# Patient Record
Sex: Female | Born: 1955 | Race: White | Hispanic: No | Marital: Married | Smoking: Former smoker
Health system: Southern US, Community
[De-identification: ages and names within clinical notes are randomized; demographics above are authoritative.]

## PROBLEM LIST (undated history)

## (undated) DIAGNOSIS — K589 Irritable bowel syndrome without diarrhea: Secondary | ICD-10-CM

## (undated) DIAGNOSIS — K219 Gastro-esophageal reflux disease without esophagitis: Secondary | ICD-10-CM

## (undated) DIAGNOSIS — F319 Bipolar disorder, unspecified: Secondary | ICD-10-CM

## (undated) DIAGNOSIS — M810 Age-related osteoporosis without current pathological fracture: Secondary | ICD-10-CM

## (undated) DIAGNOSIS — E039 Hypothyroidism, unspecified: Secondary | ICD-10-CM

## (undated) DIAGNOSIS — L280 Lichen simplex chronicus: Secondary | ICD-10-CM

## (undated) DIAGNOSIS — K5792 Diverticulitis of intestine, part unspecified, without perforation or abscess without bleeding: Secondary | ICD-10-CM

## (undated) DIAGNOSIS — J449 Chronic obstructive pulmonary disease, unspecified: Secondary | ICD-10-CM

## (undated) DIAGNOSIS — E559 Vitamin D deficiency, unspecified: Secondary | ICD-10-CM

## (undated) DIAGNOSIS — F32A Depression, unspecified: Secondary | ICD-10-CM

## (undated) DIAGNOSIS — I341 Nonrheumatic mitral (valve) prolapse: Secondary | ICD-10-CM

## (undated) DIAGNOSIS — R52 Pain, unspecified: Secondary | ICD-10-CM

## (undated) DIAGNOSIS — Z915 Personal history of self-harm: Secondary | ICD-10-CM

## (undated) HISTORY — PX: SHOULDER ARTHROSCOPY: SHX128

## (undated) HISTORY — PX: REDUCTION MAMMAPLASTY: SUR839

## (undated) HISTORY — PX: BACK SURGERY: SHX140

## (undated) HISTORY — PX: EYE SURGERY: SHX253

## (undated) HISTORY — PX: BREAST SURGERY: SHX581

## (undated) HISTORY — PX: OTHER SURGICAL HISTORY: SHX169

---
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
## 1992-09-18 HISTORY — PX: LEFT OOPHORECTOMY: SHX1961

## 1998-06-02 ENCOUNTER — Other Ambulatory Visit: Admission: RE | Admit: 1998-06-02 | Discharge: 1998-06-02 | Payer: Self-pay | Admitting: Gastroenterology

## 1998-12-29 ENCOUNTER — Ambulatory Visit (HOSPITAL_COMMUNITY): Admission: RE | Admit: 1998-12-29 | Discharge: 1998-12-29 | Payer: Self-pay | Admitting: Gastroenterology

## 1998-12-29 ENCOUNTER — Encounter: Payer: Self-pay | Admitting: Gastroenterology

## 1999-06-02 ENCOUNTER — Encounter: Payer: Self-pay | Admitting: Gastroenterology

## 1999-06-02 ENCOUNTER — Ambulatory Visit (HOSPITAL_COMMUNITY): Admission: RE | Admit: 1999-06-02 | Discharge: 1999-06-02 | Payer: Self-pay | Admitting: Gastroenterology

## 1999-06-13 ENCOUNTER — Encounter: Payer: Self-pay | Admitting: Gastroenterology

## 1999-06-13 ENCOUNTER — Ambulatory Visit (HOSPITAL_COMMUNITY): Admission: RE | Admit: 1999-06-13 | Discharge: 1999-06-13 | Payer: Self-pay | Admitting: Gastroenterology

## 1999-07-11 ENCOUNTER — Other Ambulatory Visit: Admission: RE | Admit: 1999-07-11 | Discharge: 1999-07-11 | Payer: Self-pay | Admitting: Obstetrics and Gynecology

## 1999-11-17 ENCOUNTER — Ambulatory Visit (HOSPITAL_COMMUNITY): Admission: RE | Admit: 1999-11-17 | Discharge: 1999-11-17 | Payer: Self-pay | Admitting: Family Medicine

## 1999-11-17 ENCOUNTER — Encounter: Payer: Self-pay | Admitting: Family Medicine

## 2001-03-22 ENCOUNTER — Ambulatory Visit (HOSPITAL_COMMUNITY): Admission: RE | Admit: 2001-03-22 | Discharge: 2001-03-22 | Payer: Self-pay | Admitting: Family Medicine

## 2001-03-22 ENCOUNTER — Encounter: Payer: Self-pay | Admitting: Family Medicine

## 2001-06-20 ENCOUNTER — Encounter: Admission: RE | Admit: 2001-06-20 | Discharge: 2001-06-20 | Payer: Self-pay | Admitting: Neurosurgery

## 2001-06-20 ENCOUNTER — Encounter: Payer: Self-pay | Admitting: Neurosurgery

## 2001-07-05 ENCOUNTER — Encounter: Payer: Self-pay | Admitting: Neurosurgery

## 2001-07-05 ENCOUNTER — Encounter: Admission: RE | Admit: 2001-07-05 | Discharge: 2001-07-05 | Payer: Self-pay | Admitting: Neurosurgery

## 2001-07-26 ENCOUNTER — Ambulatory Visit (HOSPITAL_COMMUNITY): Admission: RE | Admit: 2001-07-26 | Discharge: 2001-07-26 | Payer: Self-pay | Admitting: Neurosurgery

## 2001-07-26 ENCOUNTER — Encounter: Payer: Self-pay | Admitting: Neurosurgery

## 2001-11-28 ENCOUNTER — Ambulatory Visit (HOSPITAL_BASED_OUTPATIENT_CLINIC_OR_DEPARTMENT_OTHER): Admission: RE | Admit: 2001-11-28 | Discharge: 2001-11-29 | Payer: Self-pay | Admitting: Plastic Surgery

## 2001-11-28 ENCOUNTER — Encounter (INDEPENDENT_AMBULATORY_CARE_PROVIDER_SITE_OTHER): Payer: Self-pay | Admitting: Specialist

## 2002-03-03 ENCOUNTER — Encounter: Admission: RE | Admit: 2002-03-03 | Discharge: 2002-03-03 | Payer: Self-pay | Admitting: *Deleted

## 2002-03-03 ENCOUNTER — Encounter: Payer: Self-pay | Admitting: *Deleted

## 2002-06-27 ENCOUNTER — Encounter: Payer: Self-pay | Admitting: Neurosurgery

## 2002-06-27 ENCOUNTER — Encounter: Admission: RE | Admit: 2002-06-27 | Discharge: 2002-06-27 | Payer: Self-pay | Admitting: Neurosurgery

## 2002-07-25 ENCOUNTER — Encounter: Payer: Self-pay | Admitting: Neurosurgery

## 2002-07-25 ENCOUNTER — Encounter: Admission: RE | Admit: 2002-07-25 | Discharge: 2002-07-25 | Payer: Self-pay | Admitting: Neurosurgery

## 2002-10-17 ENCOUNTER — Emergency Department (HOSPITAL_COMMUNITY): Admission: EM | Admit: 2002-10-17 | Discharge: 2002-10-17 | Payer: Self-pay | Admitting: Emergency Medicine

## 2003-01-13 ENCOUNTER — Encounter: Payer: Self-pay | Admitting: Neurosurgery

## 2003-01-13 ENCOUNTER — Ambulatory Visit (HOSPITAL_COMMUNITY): Admission: RE | Admit: 2003-01-13 | Discharge: 2003-01-13 | Payer: Self-pay | Admitting: Neurosurgery

## 2003-01-22 ENCOUNTER — Encounter: Payer: Self-pay | Admitting: *Deleted

## 2003-01-22 ENCOUNTER — Emergency Department (HOSPITAL_COMMUNITY): Admission: EM | Admit: 2003-01-22 | Discharge: 2003-01-22 | Payer: Self-pay | Admitting: Emergency Medicine

## 2003-02-08 ENCOUNTER — Encounter: Payer: Self-pay | Admitting: Neurosurgery

## 2003-02-08 ENCOUNTER — Ambulatory Visit (HOSPITAL_COMMUNITY): Admission: RE | Admit: 2003-02-08 | Discharge: 2003-02-08 | Payer: Self-pay | Admitting: Neurosurgery

## 2003-03-12 ENCOUNTER — Encounter
Admission: RE | Admit: 2003-03-12 | Discharge: 2003-06-10 | Payer: Self-pay | Admitting: Physical Medicine & Rehabilitation

## 2003-06-12 ENCOUNTER — Encounter
Admission: RE | Admit: 2003-06-12 | Discharge: 2003-09-10 | Payer: Self-pay | Admitting: Physical Medicine & Rehabilitation

## 2003-08-17 ENCOUNTER — Other Ambulatory Visit: Admission: RE | Admit: 2003-08-17 | Discharge: 2003-08-17 | Payer: Self-pay | Admitting: Family Medicine

## 2003-08-26 ENCOUNTER — Encounter: Admission: RE | Admit: 2003-08-26 | Discharge: 2003-08-26 | Payer: Self-pay | Admitting: Gastroenterology

## 2003-08-26 ENCOUNTER — Encounter: Admission: RE | Admit: 2003-08-26 | Discharge: 2003-08-26 | Payer: Self-pay | Admitting: Psychology

## 2003-11-06 ENCOUNTER — Encounter
Admission: RE | Admit: 2003-11-06 | Discharge: 2004-02-04 | Payer: Self-pay | Admitting: Physical Medicine & Rehabilitation

## 2004-01-26 ENCOUNTER — Encounter
Admission: RE | Admit: 2004-01-26 | Discharge: 2004-01-26 | Payer: Self-pay | Admitting: Physical Medicine & Rehabilitation

## 2004-02-01 ENCOUNTER — Encounter
Admission: RE | Admit: 2004-02-01 | Discharge: 2004-05-01 | Payer: Self-pay | Admitting: Physical Medicine & Rehabilitation

## 2004-04-12 ENCOUNTER — Ambulatory Visit (HOSPITAL_COMMUNITY): Admission: RE | Admit: 2004-04-12 | Discharge: 2004-04-13 | Payer: Self-pay | Admitting: Neurosurgery

## 2004-07-02 ENCOUNTER — Emergency Department (HOSPITAL_COMMUNITY): Admission: EM | Admit: 2004-07-02 | Discharge: 2004-07-02 | Payer: Self-pay | Admitting: Family Medicine

## 2004-07-06 ENCOUNTER — Encounter
Admission: RE | Admit: 2004-07-06 | Discharge: 2004-10-04 | Payer: Self-pay | Admitting: Physical Medicine & Rehabilitation

## 2004-07-08 ENCOUNTER — Ambulatory Visit: Payer: Self-pay | Admitting: Physical Medicine & Rehabilitation

## 2004-08-21 ENCOUNTER — Encounter: Admission: RE | Admit: 2004-08-21 | Discharge: 2004-08-21 | Payer: Self-pay | Admitting: Neurosurgery

## 2004-08-29 ENCOUNTER — Ambulatory Visit: Payer: Self-pay | Admitting: Physical Medicine & Rehabilitation

## 2004-11-07 ENCOUNTER — Encounter
Admission: RE | Admit: 2004-11-07 | Discharge: 2005-02-05 | Payer: Self-pay | Admitting: Physical Medicine & Rehabilitation

## 2004-11-07 ENCOUNTER — Ambulatory Visit: Payer: Self-pay | Admitting: Physical Medicine & Rehabilitation

## 2005-01-03 ENCOUNTER — Emergency Department (HOSPITAL_COMMUNITY): Admission: EM | Admit: 2005-01-03 | Discharge: 2005-01-03 | Payer: Self-pay | Admitting: Emergency Medicine

## 2005-01-05 ENCOUNTER — Encounter: Admission: RE | Admit: 2005-01-05 | Discharge: 2005-01-05 | Payer: Self-pay | Admitting: Neurosurgery

## 2005-05-15 ENCOUNTER — Encounter: Admission: RE | Admit: 2005-05-15 | Discharge: 2005-05-15 | Payer: Self-pay | Admitting: Family Medicine

## 2005-05-24 ENCOUNTER — Ambulatory Visit: Payer: Self-pay | Admitting: Physical Medicine & Rehabilitation

## 2005-05-24 ENCOUNTER — Encounter
Admission: RE | Admit: 2005-05-24 | Discharge: 2005-08-22 | Payer: Self-pay | Admitting: Physical Medicine & Rehabilitation

## 2005-09-18 DIAGNOSIS — Z9151 Personal history of suicidal behavior: Secondary | ICD-10-CM

## 2005-09-18 HISTORY — DX: Personal history of suicidal behavior: Z91.51

## 2005-12-15 ENCOUNTER — Ambulatory Visit: Payer: Self-pay | Admitting: Infectious Diseases

## 2005-12-15 ENCOUNTER — Observation Stay (HOSPITAL_COMMUNITY): Admission: EM | Admit: 2005-12-15 | Discharge: 2005-12-16 | Payer: Self-pay | Admitting: Emergency Medicine

## 2006-03-02 ENCOUNTER — Emergency Department (HOSPITAL_COMMUNITY): Admission: EM | Admit: 2006-03-02 | Discharge: 2006-03-02 | Payer: Self-pay | Admitting: Family Medicine

## 2006-04-12 ENCOUNTER — Emergency Department (HOSPITAL_COMMUNITY): Admission: EM | Admit: 2006-04-12 | Discharge: 2006-04-13 | Payer: Self-pay | Admitting: Emergency Medicine

## 2006-06-08 ENCOUNTER — Emergency Department (HOSPITAL_COMMUNITY): Admission: EM | Admit: 2006-06-08 | Discharge: 2006-06-08 | Payer: Self-pay | Admitting: Emergency Medicine

## 2006-06-08 ENCOUNTER — Ambulatory Visit: Payer: Self-pay | Admitting: Family Medicine

## 2006-06-11 ENCOUNTER — Encounter: Admission: RE | Admit: 2006-06-11 | Discharge: 2006-06-11 | Payer: Self-pay | Admitting: Specialist

## 2006-06-15 ENCOUNTER — Encounter: Admission: RE | Admit: 2006-06-15 | Discharge: 2006-06-15 | Payer: Self-pay | Admitting: Family Medicine

## 2006-06-28 ENCOUNTER — Encounter: Admission: RE | Admit: 2006-06-28 | Discharge: 2006-06-28 | Payer: Self-pay | Admitting: Gastroenterology

## 2007-01-23 ENCOUNTER — Encounter: Admission: RE | Admit: 2007-01-23 | Discharge: 2007-01-23 | Payer: Self-pay | Admitting: Gastroenterology

## 2007-07-30 ENCOUNTER — Encounter: Admission: RE | Admit: 2007-07-30 | Discharge: 2007-07-30 | Payer: Self-pay | Admitting: Family Medicine

## 2008-01-14 ENCOUNTER — Ambulatory Visit (HOSPITAL_COMMUNITY): Admission: RE | Admit: 2008-01-14 | Discharge: 2008-01-14 | Payer: Self-pay | Admitting: Chiropractic Medicine

## 2008-05-14 ENCOUNTER — Encounter: Admission: RE | Admit: 2008-05-14 | Discharge: 2008-05-14 | Payer: Self-pay | Admitting: Anesthesiology

## 2009-01-06 ENCOUNTER — Encounter: Admission: RE | Admit: 2009-01-06 | Discharge: 2009-01-06 | Payer: Self-pay | Admitting: Anesthesiology

## 2010-01-14 ENCOUNTER — Encounter: Admission: RE | Admit: 2010-01-14 | Discharge: 2010-01-14 | Payer: Self-pay | Admitting: Gastroenterology

## 2011-01-03 ENCOUNTER — Other Ambulatory Visit (HOSPITAL_COMMUNITY): Payer: Self-pay | Admitting: Gastroenterology

## 2011-01-03 DIAGNOSIS — R112 Nausea with vomiting, unspecified: Secondary | ICD-10-CM

## 2011-01-13 ENCOUNTER — Other Ambulatory Visit (HOSPITAL_COMMUNITY): Payer: Self-pay

## 2011-01-28 ENCOUNTER — Emergency Department (HOSPITAL_COMMUNITY)
Admission: EM | Admit: 2011-01-28 | Discharge: 2011-01-28 | Disposition: A | Discharge status: Home / Self Care | Payer: 59 | Attending: Emergency Medicine | Admitting: Emergency Medicine

## 2011-01-28 ENCOUNTER — Emergency Department (HOSPITAL_COMMUNITY): Payer: 59

## 2011-01-28 DIAGNOSIS — K5732 Diverticulitis of large intestine without perforation or abscess without bleeding: Secondary | ICD-10-CM | POA: Insufficient documentation

## 2011-01-28 DIAGNOSIS — R1032 Left lower quadrant pain: Secondary | ICD-10-CM | POA: Insufficient documentation

## 2011-01-28 DIAGNOSIS — E039 Hypothyroidism, unspecified: Secondary | ICD-10-CM | POA: Insufficient documentation

## 2011-01-28 DIAGNOSIS — F17200 Nicotine dependence, unspecified, uncomplicated: Secondary | ICD-10-CM | POA: Insufficient documentation

## 2011-01-28 DIAGNOSIS — Z90710 Acquired absence of both cervix and uterus: Secondary | ICD-10-CM | POA: Insufficient documentation

## 2011-01-28 LAB — CBC
HCT: 43.5 % (ref 36.0–46.0)
MCH: 31.2 pg (ref 26.0–34.0)
MCHC: 34.5 g/dL (ref 30.0–36.0)
MCV: 90.4 fL (ref 78.0–100.0)
Platelets: 262 10*3/uL (ref 150–400)
RDW: 13.6 % (ref 11.5–15.5)
WBC: 14.9 10*3/uL — ABNORMAL HIGH (ref 4.0–10.5)

## 2011-01-28 LAB — DIFFERENTIAL
Eosinophils Absolute: 0.1 10*3/uL (ref 0.0–0.7)
Eosinophils Relative: 0 % (ref 0–5)
Lymphs Abs: 3.4 10*3/uL (ref 0.7–4.0)
Monocytes Absolute: 1.2 10*3/uL — ABNORMAL HIGH (ref 0.1–1.0)
Monocytes Relative: 8 % (ref 3–12)

## 2011-01-28 LAB — COMPREHENSIVE METABOLIC PANEL
Albumin: 4.1 g/dL (ref 3.5–5.2)
BUN: 10 mg/dL (ref 6–23)
Calcium: 8.7 mg/dL (ref 8.4–10.5)
Creatinine, Ser: 0.82 mg/dL (ref 0.4–1.2)
Glucose, Bld: 95 mg/dL (ref 70–99)
Total Protein: 7.2 g/dL (ref 6.0–8.3)

## 2011-01-28 LAB — URINALYSIS, ROUTINE W REFLEX MICROSCOPIC
Glucose, UA: NEGATIVE mg/dL
Leukocytes, UA: NEGATIVE
Specific Gravity, Urine: 1.021 (ref 1.005–1.030)
Urobilinogen, UA: 0.2 mg/dL (ref 0.0–1.0)

## 2011-01-28 LAB — URINE MICROSCOPIC-ADD ON

## 2011-01-28 LAB — LIPASE, BLOOD: Lipase: 13 U/L (ref 11–59)

## 2011-02-03 NOTE — Op Note (Signed)
NAME:  [name], [name]                         ACCOUNT NO.:  [id number]   MEDICAL RECORD NO.:  [id number]                   PATIENT TYPE:  OIB   LOCATION:  NA                                   FACILITY:  [facility]   PHYSICIAN:  Henry A. Pool, M.D.                 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   PROCEDURE:  Bilateral L4-5 decompressive laminotomies with foraminotomies.   SURGEON:  Kathaleen Maser. Pool, M.D.   ASSISTANT:  Reinaldo Meeker, M.D.   ANESTHESIA:  General endotracheal.   INDICATIONS FOR PROCEDURE:  The patient is a 55 year old female with a  history of bilateral lower extremity pain, consistent with neurogenic  claudication, failing conservative management. Workup demonstrates evidence  of degenerative disk disease at L4-5 and L5-S1, with broad-based disk  bulging and facet hypertrophy, coupled with ligamentous buckling at L4-5,  causing moderately-severe stenosis, particularly in the lateral recess.  The  patient has been counseled as to her options.  She has failed conservative  management.  She has decided to proceed with bilateral L4-5 decompressive  laminotomies.   DESCRIPTION OF PROCEDURE:  The patient is taken to the operating room and  placed on the table in the supine position.  After an adequate level of  anesthesia was achieved, the patient was placed prone onto the Wilson frame  and appropriately padded.  The patient's lumbar region was prepped and  draped sterilely.  A #10 blade was used to make a linear skin incision  overlying the L4-5 interspace.  This was carried down sharply in the  midline.  A subperiosteal dissection was then performed, exposing the lamina  of the facet joints at L4 and L5.  A deep self-retaining retractor was  placed.   Intraoperative x-ray was taken.  The level was confirmed.  A  decompressive laminotomy was then performed bilaterally using a high-speed  drill and the Kerrison rongeurs to remove the inferior one-third of the  lamina of L4 and the medial edge of the L4-5 facet joint, and the superior  rim of L5.  The ligament of flavum was then elevated bilaterally using  Kerrison rongeurs.  The underlying thecal sac and exiting L5 nerve roots  were identified.  A wide foraminotomy was performed along the course of the  exiting L5 nerve roots bilaterally.  Then using microdissection of the  spinal canal, starting first on the patient's right side.  The L5 nerve root  was gradually mobilized and tracked towards the midline.  The epidural  venous plexus was coagulated and cut.  The disk space while diffusely  bulging was not frankly herniated, and a diskectomy did not seem to be  appropriate.  The contralateral side was  explored in a similar fashion and  the findings were also similar.  A blunt probe was placed along the course  of the exiting nerve root.  There was no evidence of injury to the thecal  sac or nerve roots.  The wound was then copiously irrigated with antibiotic  solution.  Gelfoam was placed for operative hemostasis and found to be good.  The microscope and the retractor system were removed.  Hemostasis in the  muscle was achieved  with electrocautery.  The wounds were then closed in layers with Vicryl  sutures.  Steri-Strips and a sterile dressing were applied.  There are no  apparent complications.  The patient tolerated the operative procedure well and she returns to the  recovery room postoperatively.                                               Henry A. Pool, M.D.    HAP/MEDQ  D:  04/12/2004  T:  04/12/2004  Job:  [id number]

## 2011-02-03 NOTE — Assessment & Plan Note (Signed)
[name] returns today after she had a medial branch block at L3-4, L4-5,  and L5-S1 bilaterally.  She had pain relief for two days in terms of her low  back pain.  Since that time, she was doing as well as usual when she had an  exacerbation on Jan 29, 2004, without a history of trauma.  The patient has  no significant leg pain.  She does have some thigh pain on the right side,  mainly posterior and lateral.   No weakness in her legs.  The pain diagram indicates pain in the low back  and posterior left thigh, although she states it is actually the right side  that hurts her.  The right side is 10/10.   REVIEW OF SYSTEMS:  No bowel or bladder problems.   VOCATIONAL HISTORY:  She worked a full day on Friday and went to work this  morning as well.   PHYSICAL EXAMINATION:  Blood pressure 116/72, pulse 98, O2 saturation 98% on  room air.  General:  In no acute distress.  Mood and affect appropriate.  Gait:  Moves slowly and steadily, but otherwise no antalgia.  She has  tenderness to palpation in the right gluteus medius area also in the midline  at L5-S1.   She has 50% range on forward flexion, extension, lateral rotation, and  bending.  Pain with both forward flexion and extension.  Motor strength is  5/5 in bilateral lower extremities.  She has no tenderness to palpation in  bilateral lower extremities.  Full range of motion in bilateral lower  extremities.  Normal deep tendon reflexes in bilateral lower extremities.   IMPRESSION:  1. Lumbar facet syndrome.  2. Myofascial pain syndrome.   PLAN:  1. Do trigger point injection of right gluteus medius, which is tender to     touch today.  2. Referral back to Dr. Stevphen Rochester for lumbar medial branch blocks.   ADDENDUM:  I did review her MR of the lumbar spine, which really showed no  changes compared to the previous study one year ago.  There is facet  arthropathy noted at L3-4 and disk protrusion at L4-5, as well as facet  arthropathy at this level, and a mild to moderate multifactorial canal  stenosis secondary to these two features.  L5-S1 had a broad based disk  bulge.  Some foraminal encroachment.  In addition, she had MR of her C-  spine.  Once again no significant change.  Previously she has had anterior  fusion at C4-5, kyphosis at C3-4, and some right paracentral protrusion at  C3-4.  No change in cord signal.   PLAN:  As noted above.  I have also given her some Celebrex samples 300 mg  today with the first day starting with 300 mg.   ADDENDUM:  Trigger point at right gluteus medius:  Informed consent was  obtained.  The area was marked, prepped with Betadine, and entered with a 25  gauge 1/4 inch needle at three separate sites that were tender to touch  after prep.  Then 1 mL of lidocaine was injected at each site after negative  drawback for blood.  The patient tolerated the procedure well.      Erick Colace, M.D.   AEK/MedQ  D:  02/01/2004 16:58:19  T:  02/01/2004 19:48:39  Job #:  [id number]   cc:   Celene Kras, MD  Fax: [phone]

## 2011-02-03 NOTE — Op Note (Signed)
[place]  Patient:    [name], [name] Visit Number: [id number] MRN: [id number]          Service Type: DSU Location: [facility] Attending Physician:  Eloise Levels Dictated by:   Mary A. Contogiannis, M.D. Proc. Date: 11/28/01 Admit Date:  11/28/2001   CC:         Darcella Cheshire, M.D.   Operative Report  PREOPERATIVE DIAGNOSIS:  Bilateral macromastia.  POSTOPERATIVE DIAGNOSIS:  Bilateral macromastia.  PROCEDURE:  Bilateral reduction mammoplasties.  ATTENDING SURGEON:  Mary A. Contogiannis, M.D.  ASSISTANT:  Darcella Cheshire, M.D.  ANESTHESIA:  General endotracheal.  ANESTHESIOLOGIST:  Judie Petit, M.D.  ESTIMATED BLOOD LOSS:  300 cc.  FLUID REPLACEMENT:  3,000 cc of crystalloid.  URINE OUTPUT: 175 cc.  COMPLICATIONS:  None.  JUSTIFICATION FOR OVERNIGHT STAY:  Progressive pain control, along with ambulation and monitoring of the nipples and breast flaps.  INDICATIONS FOR THE PROCEDURE:  The patient is a 55 year old Caucasian female who has bilateral macromastia that is clinically symptomatic with neck aches, backaches, shoulder strap groove marks, and pain, along with intertriginous skin rashes.  She presents and requests to undergo bilateral reduction mammoplasties.  PROCEDURE:  The patient was marked in the preoperative holding area in the pattern of Wise for the future of bilateral reduction mammoplasties.  She had had a previous mastopexy that had a periareolar and vertical scar component, and after talking to the surgeon, he said that with the previous mastopexy, all he did was remodel her skin and did not go into the parenchyma of the breast so the inferior pedicle pattern should be a safe method for the nipple. Preoperatively, I also discussed with the patient that since one of her complaints with her previous mastopexy was that her nipples were so high that she may again have high-placed nipples because I was  somewhat limited by the scarring and position that was present now with planning the future position. She said that she will be accepting of this, and of course, I marked her, trying to lower the nipple as much as possible.  The patient was then taken back into the OR.  She was placed on the table in the supine position, and general endotracheal anesthesia was obtained. The chest was then prepped with Betadine and alcohol and draped in a sterile fashion.  The base of the breasts were then injected with 1% lidocaine with epinephrine.  After adequate hemostasis and anesthesia had taken effect, the procedure was begun.  First, the right breast reduction was performed.  The nipple was incised with the 42 mm nipple marker.  The skin was then incised and de-epithelialized down to the inframammary crease in an inferior pedicle pattern.  Next, the medial, superior, and lateral skin flaps were then elevated down to the chest wall and slightly off the chest wall for shaping them.  The excess fat and glandular tissue were removed from the inferior pedicle.  The nipple was then examined and found to be pink and viable.  The wound was then irrigated with saline irrigation. Meticulous hemostasis was then obtained with the Bovie electrocautery.  The inferior pedicle was then centralized with 3-0 Prolene suture.  A #10 flat Foley fluted drain was then placed into the wound.  The skin flaps were brought together at the inverted T junction with a 2-0 Prolene suture.  These incisions were stapled for temporary closure.  Attention was then turned to the left breast.  The nipple  likewise was incised with a 42 mm nipple marker. The skin was then incised around this and de-epithelialized down to the inframammary crease in the inferior pedicle pattern.  Next, the medial, superior, and lateral skin flaps were then elevated down to the chest wall and slightly off the chest wall for better shaping of the breast.   The excess fat and glandular tissue were excised from the inferior pedicle. The nipple was then examined and found to be pink and viable.  The wound was irrigated with saline irrigation.  Meticulous hemostasis was obtained with the Bovie electrocautery.  A #10 JP flap fully fluted drain was then placed into the wound.  The skin flaps were brought together in the inverted T junction with a 2-0 Prolene suture.  The incision was then stapled for temporary closure.  The breasts were then compared and found to have good shape and symmetry.  The patient was then placed up into the upright position for marking of the nipple locations.  The nipples were then marked on each breast using a 38 mm nipple marker.  She was then placed back into the recumbent position.  First, the future nipple/areolar complex on the right breast was incised.  This tissue was excised full thickness.  The nipple was then examined and found to be pink and viable and then brought out into this aperture and sewn in place using 4-0 Monocryl interrupted dermal sutures, followed by a 5-0 Monocryl intracuticular stitch on the skin.  In a likewise fashion, the area for the future nipple areola complex was then incised on the left breast.  This tissue was excised full thickness.  The nipple was then examined and found to be pink and viable. It was then brought out into this aperture and sewn in place using 4-0 Monocryl interrupted dermal sutures, followed by 5-0 Monocryl interrupted stitch on the skin. The JP drains were sewn in place using 3-0 nylon suture. The incisions were dressed with Benzoin and Steri-Strips and the nipples additionally with Bacitracin ointment and Adaptic.  4 x 4 gauze was then placed over all the incisions, and the patient was placed into a light postoperative support bra. There were no complications.  The patient tolerated the procedure well. The final needle and sponge counts were reported to be correct  at the end of the case. She was then awakened from general anesthesia  and then taken to the recovery room in stable condition. The patient will stay overnight in the [facility] for progressive pain control, along with ambulation and monitoring of the nipples and breast flaps.  Discharge planned for the morning. Dictated by:   Mary A. Contogiannis, M.D. Attending Physician:  Eloise Levels DD:  11/28/01 TD:  11/29/01 Job: [id number]

## 2011-02-03 NOTE — Procedures (Signed)
NAME:  Lynn, [name]                         ACCOUNT NO.:  [id number]   MEDICAL RECORD NO.:  [id number]                   PATIENT TYPE:  REC   LOCATION:  [facility]                                  FACILITY:  [facility]   PHYSICIAN:  Celene Kras, MD                     DATE OF BIRTH:  01-26-1956   DATE OF PROCEDURE:  02/23/2004  DATE OF DISCHARGE:                                 OPERATIVE REPORT   PATIENT:  [name]   MEDICAL RECORD NUMBER:  [id number]   DATE OF BIRTH:  July 20, 1956   SURGEON:  Jewel Baize. Stevphen Rochester, M.D.   [name] and I evaluated  her via health and history form and 14-point review of systems.   1. I had extensive discussion as to overall directed care approach and     indications and contraindications of radiofrequency neural ablation.  She     has demonstrated a positive provocative block and improvement in function     and quality of life indices.  Her pain is primarily directed leftward.     She does have some right-sided pain, but this is not her most disabling     profile.   1. I am going to go ahead and radiofrequency neural ablate the left facet at     the medial branch.  This will be at L5-S1, L4-5, L3-4, and L2-3 with     contributory innervation addressed.  She understands that the potential     complications include bleeding, infection, nerve damage, stroke, seizure,     death, increase in pain, no relief from pain, and other unforeseen     problems not commonly encountered.  She also understands potential for     neuritis.  She also understands the potential for repeating the     procedure.   1. I recommend cigarette cessation for best outcome.   1. Home based therapy reviewed.   1. She relates paracervical dysfunction and myofascial pain that is starting     to evolve from previous cervical pain that she has experienced after     surgery.  There is nothing new neurologically.  This is a modest pain  that is not particularly pronounced today, but we will keep this in the     differential.   She has consented for today's procedure.   OBJECTIVE:  She had diffuse suprascapular paracervical myofascial discomfort  and pain over PSIS with notable pain extension.  She has no new neurological  findings motor, sensory or reflexive.   IMPRESSION:  1. 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  2. Degenerative spine disease of cervical spine.   PROCEDURE:  The patient was taken to the fluoroscopy suite and placed in the  prone position.  The leg was prepped and draped in the usual fashion.  Using  a 22 gauge mm  active tip RF needle, I advance to the facet medial branch of  L5-S1, L4-5, L3-4, and L2-3.  Confirmed placement of multiple fluoroscopic  positions.  Highest Isovue 200.  I appropriately stimulate.  I then  reconfirm needle placement at multiple points during the procedure.  I  injected 1 mL of local anesthetic at each level with a total of 40 mg of  Aristocort in divided dose.  Lesion is then performed at 70 degrees for 70  seconds at each level.   She tolerated the procedure well.  No complications from our procedure.  Appropriate recovery.  Discharge instructions given.  Will see her in  followup.  No complications identified.   PLAN:  Facet neurotomy of left side at L5-S1, L4-5, L3-4, and L2-3.  Contributory innervation consented.  She is consented left side under local  anesthetic.                                                Celene Kras, MD    HH/MEDQ  D:  02/23/2004 10:45:37  T:  02/23/2004 11:49:06  Job:  [id number]

## 2011-02-03 NOTE — Assessment & Plan Note (Signed)
MEDICAL RECORD NUMBER:  [id number]   DATE OF BIRTH:  12/05/1955   REASON FOR FOLLOW-UP:  The patient comes in today after I last saw her on  Feb 01, 2004.  She has had prior improvements in low back pain after lumbar  medial branch blocks.  She had an MR of the lumbar spine on Jan 26, 2004  which showed facet arthropathy at L3-4, a disk protrusion at L4-5 greater  than L5-S1 with some foraminal encroachment at L4-5 and L5-S1, but per  radiology no significant changes.  MR of her cervical spine did not show any  significant changes.  She has at interval times seen Dr. Stevphen Rochester on February 23, 2004 who did a left sided lumbar medial branches with radiofrequency  denervation.  She has not noted any significant changes in her pain since  the procedure on June 7, but as she was told by Dr. Stevphen Rochester, her pain could  get worse for a month before it gets much better.  In the interval time  since I last saw the patient, she has seen Dr. Dutch Quint and she states that she  is scheduled for surgery on April 12, 2004 for what sounds like decompression  laminectomy without fusion.  However, I do not have notes.   MEDICATIONS:  1. She has gone up on her Norco dosage in frequency taking about three and a     half tablets a day, but now her pain is easing up again and is down to     three tablets a day.  She has been out for two days now.  2. Zanaflex 4 mg q.h.s.  3. Ambien 10 mg q.h.s.  4. Wellbutrin XL which she is getting through her primary care office.  5. Lidoderm patch.  She does feel that the lidocaine patches are effective.   INTERVAL HISTORY:  Her interval history is as above.  Her current pain level  is 9 out of 10, low back pain going into the posterior thighs but not below  the knee and also neck and upper trapezius pain.   SOCIAL HISTORY:  She continues to work full time. She smokes a pack a day.   REVIEW OF SYMPTOMS:  As above.  GENERAL:  She continues with poor sleep.   PHYSICAL EXAMINATION:   VITAL SIGNS:  Blood pressure 127/91, pulse 77,  respirations 20, 02 saturation 97% in room air.  SENSORY EXAM:  Decreased left L4-5 and S1 dermatome sensation to pinprick.  Her motor strength is 5/5 in the bilateral upper and lower extremities.  Her  neck has full flexion and extension. However, lateral rotation and bending  are approximately 25% of normal.  Her low back - she is able to touch her  knees bending forward; bending backward she has about 25% range and lateral  bending and rotation approximately 25%.  She has normal deep tendon reflexes  in the bilateral lower extremities and normal range of motion of bilateral  lower extremities.  No pain to palpation of bilateral lower extremities.  She has normal strength in the bilateral upper and lower extremities and is  able to walk and heel walk.   IMPRESSION:  1. Chronic low back pain extending into the posterior thighs with     improvement after lumbar medial branch blocks, at least temporarily.     Thus far, she has not noted much improvement with left radiofrequency.  2. Lumbar disk disease with L4-5 displacement without evidence of myelopathy  at this point.  The patient asked me whether or not I thought that the     surgery would help with her pain symptoms and I stated it was a fairly     difficult question given that her pain extends to her knees but not     below.  She is a bit apprehensive about having surgery again.  I     encouraged her to follow-up with Dr. Stevphen Rochester in regards to discussing any     other medial branch blocks or radiofrequency neurotomy.  In terms of     medications, I have recommended continuing the hydrocodone 10/325 mg and     have it t.i.d. dosed during work. She actually just takes a half a tablet     during the day.  I have indicated that she may do better with a longer     acting medication should she require long term narcotic analgesics.  Any     postoperative pain management would be per her  neurosurgery and I would     be happy to see her if she has significant residual pain.  She can     continue on her Zanaflex as well.  The ultimate goal would be to able to     manage her without narcotic analgesics and get her just back to something     such as Ultram along with Zanaflex and Lidoderm patch.      Erick Colace, M.D.   AEK/MedQ  D:  03/18/2004 13:08:05  T:  03/18/2004 17:30:20  Job #:  [id number]   cc:   Sherilyn Cooter A. Pool, M.D.  [address]  [place]  [place] [address]  Fax: [phone]

## 2011-02-03 NOTE — Procedures (Signed)
NAME:  [name], [name]                         ACCOUNT NO.:  [id number]   MEDICAL RECORD NO.:  [id number]                   PATIENT TYPE:  REC   LOCATION:  TPC                                  FACILITY:  [facility]   PHYSICIAN:  Celene Kras, MD                     DATE OF BIRTH:  [birth year]   DATE OF PROCEDURE:  DATE OF DISCHARGE:                                 OPERATIVE REPORT   SUBJECTIVE:  [name] to  evaluate her __________ 14 point review of systems.   #1 - [name] comes in today after a lengthy absence from the clinic.  She has  had some difficulties in her life.  Her mother has gotten sick, and she has  had a friend who lost another loved one.  These are certainly very  distressing issues, but certainly from a continuity standpoint, it is going  to be necessary for her to make time to maintain contact with our office to  move forward and improve her quality of life.   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  We would like to encourage her to see her  primary care physician to help with cigarette cessation.  This will be for  her best outcome position.  We discussed home based therapy.  I would like  to get her in a formalized training program.   #3 - Will go ahead and proceed with medial branch injection.  Most  problematic side is the left side.  Risks, complications and options are  fully outlined.  Although she has bilateral pain, I do not think we need to  do bilateral injections.  The left side is most problematic, and I will see  her back in 2 weeks to determine further course of care.   #4 - I am not going to go too far into controlled substances.  I think we  can control her with nonnarcotic medication alternatives, multimodality  approach to pain control and proceed with  medial branch injection with  possible reinforcement radiofrequency neural ablation with positive  provocative block.  I just do not think narcotics are warranted at this  time.   OBJECTIVE:  Diffuse paralumbar myofascial discomfort, pain over PSIS,  notable pain on extension with Gaenslen's and Patrick's equivocal.  She has  no new neurological findings, motor and sensory reflexes.   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   PLAN:  Facet medial branch injection L5-S1, L4-5, L3-4, L2-3 with  contributory innervation addressed.  This will be at the left side.   DESCRIPTION OF PROCEDURE:  The patient was taken to the fluoroscopy suite  and placed in the prone  position.  Neck prepped and draped in the usual  fashion.  Using a 22 gauge spinal needle, I advanced the facet medial branch  at L5-S1, L4-5, L3-4, L2-3.  Contributory innervation addressed, left side.  I confirmed placement of multiple fluoroscopic positions.  I then injected 1  cc of Lidocaine and 1% MPF at each level with a total of 40 mg of Aristocort  in divided dose.   Tolerated this procedure well.  No complications while in procedure.  Appropriate recovery.  Discharge instructions given.  Improved at discharge.                                                Celene Kras, MD    HH/MEDQ  D:  12/01/2003 08:46:53  T:  12/01/2003 10:20:08  Job:  [id number]

## 2011-02-03 NOTE — Procedures (Signed)
NAME:  [name], [name]                         ACCOUNT NO.:  [id number]   MEDICAL RECORD NO.:  [id number]                   PATIENT TYPE:   LOCATION:                                       FACILITY:  [facility]   PHYSICIAN:  Celene Kras, MD                     DATE OF BIRTH:  Jan 17, 1956   DATE OF PROCEDURE:  12/22/2003  DATE OF DISCHARGE:                                 OPERATIVE REPORT   HISTORY:  The patient comes in for pain management today.  I evaluated her  via the health and history form with a 14-point review of systems.  She comes to [place] stating that the left side medial branch injection improved  her function and quality of life by about 25%-50%, with nearly three to four  days of complete diminution of pain perception, and she has not quite  reached recrudescence to baseline.  She does have some right-sided pain, and  it is reasonable to inject both sides today, prior to considerations of  radiofrequency neural ablation.  She has seen her primary care physician about cigarette cessation, which is  the best outcome predictive element.  Her mother has many medical problems interfering with her ability to  participate with after-work home-based therapy.  We are encouraged that she  continues to work, and emphasized this as another outcome of predictive  element.   PHYSICAL EXAMINATION:  Objectively, diffuse paralumbar myofascial  discomfort.  Pain over the PSIS.  Notable pain on extension.  No new  neurological findings in motor, sensory, or reflexes.  She seems to have a  little bit better range of motion.   IMPRESSION:  1. 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  2. Facet syndrome.   PLAN:  Facet medial branch injection at L5-S1 and at L4-L5, L3-L4, right and  left sides at independent needle access points.  Contributory elements  addressed.  She has consented for today's procedure.   DESCRIPTION OF PROCEDURE:  The patient is taken to the fluoroscopy suite and  placed in the prone position.  The back was prepped and draped in the usual  fashion using a #22 gauge spinal needle.  I advanced to the facet medial  branch at L5-S1, L4-L5, L3-L4, with contributory innervation addressed,  right and left sides, at independent needle access points.  Confirmed  placement.  I then injected 1 mL of Lidocaine 1% MPF at each level, with a  total of 40 mg of Aristocort in a divided dose.  She tolerated this procedure well.  No complications from the procedure.  A review of medications, appropriate to prescriptions.  Discharge  instructions given.  Predicate movement to a radiofrequency neural ablation,  based on response.  I don't plan another injection.  I would like to see how  she does.  Celene Kras, MD    HH/MEDQ  D:  12/22/2003 08:43:30  T:  12/22/2003 10:02:20  Job:  [id number]

## 2011-02-03 NOTE — Procedures (Signed)
[name], GALENTINE               ACCOUNT NO.:  [id number]   MEDICAL RECORD NO.:  [id number]          PATIENT TYPE:  REC   LOCATION:  [facility]                          FACILITY:  [facility]   PHYSICIAN:  Erick Colace, M.D.DATE OF BIRTH:  [birth year]   DATE OF PROCEDURE:  DATE OF DISCHARGE:                                 OPERATIVE REPORT   This is bilateral levator scapular trigger point injection.   INDICATIONS FOR PROCEDURE:  Neck pain and stiffness, previously improved  with trigger point injections.   Informed consent was obtained after describing risks and benefits of the  procedure to the patient. Specific risks due to location include  pneumothorax. This was discussed. The patient understands and wishes to  proceed.   The patient has a point with maximum pain palpated, marked and prepped with  alcohol. These spots were approximately 4 cm lateral to the spinous process  of C7 bilaterally and just above the upper medial border of the scapula. A  25-gauge 1-1/2-inch needle was inserted approximately 1/2 depth. Lidocaine  1% was injected, 1 cc into each side. Trigger point deactivation produced no  twitch response. The patient tolerated the procedure well. Post injection  instructions given.      Erick Colace, M.D.  Electronically Signed     AEK/MEDQ  D:  05/25/2005 17:39:24  T:  05/26/2005 08:55:16  Job:  [id number]

## 2011-02-03 NOTE — Assessment & Plan Note (Signed)
DATE OF SERVICE:  November 08, 2004.   MEDICAL RECORD NUMBER:  [id number].   DATE OF BIRTH:  1956/05/01.   Ms. [name] returns today.  I last saw her on August 29, 2005.  She has had  some flare up of her pain.  She has stopped working because of layoff.  She  has pain-wise called in once, talked to Dr. Riley Kill and got hydrocodone one  p.o. b.i.d.  The pain is mainly in her upper trapezius area, as well as the  left side of her low back going into the left thigh.   REVIEW OF SYSTEMS:  Poor sleep.  Anxiety.  Some headaches.  Dizziness.  Reflux and heartburn.   SOCIAL HISTORY:  Smokes a half of a pack a day.  Married.   PHYSICAL EXAMINATION:  Blood pressure 122/68, pulse 76, respirations 21, O2  saturation 100% on room air.  In no acute distress.  Mood and affect  appropriate.  She has tenderness to palpation in bilateral cervical  paraspinals.  She has tenderness in left PSIS.  She has positive Pearlean Brownie on  the left lower extremity.  She has normal strength and reflexes in bilateral  lower extremities with normal range of motion.   IMPRESSION:  1.  Cervical myofascial pain syndrome.  2.  Cervical facet syndrome.  3.  Cervical spondylosis.  4.  Lumbar facet syndrome.  Has had radiofrequency just prior to admission.  5.  Lumbar myofascial pain syndrome.  6.  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   PLAN:  1.  Continue Lidoderm patch.  2.  Continue Zanaflex and increase to four t.i.d.  3.  Hydrocodone one b.i.d.  4.  Cymbalta 30 mg a day.  5.  Trigger points to bilateral upper trapezius.  6.  Left SI joint injection.      AEK/MedQ  D:  11/08/2004 11:48:08  T:  11/08/2004 14:22:45  Job #:  [id number]

## 2011-02-03 NOTE — Procedures (Signed)
NAMEANNALIAH, RIVENBARK               ACCOUNT NO.:  [id number]   MEDICAL RECORD NO.:  [id number]          PATIENT TYPE:  REC   LOCATION:  [facility]                          FACILITY:  [facility]   PHYSICIAN:  Erick Colace, M.D.DATE OF BIRTH:  [birth year]   DATE OF PROCEDURE:  07/21/2004  DATE OF DISCHARGE:                                 OPERATIVE REPORT   The patient returns today. She has had some back pain and some catching of  her left hip, sometimes buckling of the leg. Of note is she has had  ______________ Jordan Likes.  She has had lumbar spine surgery in July 2005.   She was last seen by me for cervical trigger point injections which actually  has helped quite a bit though she does have continued pain on her left side  more than on her right side.   She denies any pain going down into her arms. She has not used any narcotic  analgesics for quite a while but sometimes feels like she needs them. I have  not prescribed any for her since at least July.   PHYSICAL EXAMINATION:  Examination shows good neck range of motion except  lateral bending. She has full strength bilateral upper extremities, normal  reflexes bilateral upper extremities, normal sensation.   Her vital signs:  99/64, pulse 90, respirations 18, O2 saturation 98% per  room air.   GENERAL:  No acute distress. Mood and affect appropriate.   IMPRESSION:  Cervical myofascial pain syndrome with some underlying cervical  facet syndrome.   PLAN:  1.  Trigger point injection today.  2.  Increased Zanaflex to 4 mg b.i.d.  3.  Referral to Dr. Jordan Likes in regards to her low back problems.  4.  I have given her 20 of hydrocodone after UDS today to take on a p.r.n.      basis.   ADDENDUM:  Trigger point injection done, left upper trapezius, as well as  upper medial scapular border.   An 1-1/4-inch needle 25-gauge was utilized. A solution containing 0.5 cc of  40 mg/cc Kenalog and 2 cc of 1% lidocaine were injected at each of 5  sites.  The patient tolerated the procedure well. Post injection instructions given.      AEK/MEDQ  D:  07/21/2004 17:31:26  T:  07/22/2004 14:45:49  Job:  [id number]

## 2011-02-03 NOTE — Assessment & Plan Note (Signed)
MEDICAL RECORD NUMBER:  [id number].   [name] returns today. She has some increasing left buttock pain, some  radiating pain to the left knee laterally. She has also had bilateral neck  and upper trapezius pain as well. She stopped working because of a lay off,  has not gotten another job in the interval time. She has had some rather  dramatic upheavals. Her 56 year old son committed suicide a couple of months  ago, and currently, her mother was transferred from [facility] to  [facility] extended care skilled nursing facility.   Her last hydrocodone was filled Jan 20, 2005.   REVIEW OF SYSTEMS:  Poor sleep, numbness in the left thigh, depression,  anxiety. No suicidal thoughts.   PHYSICAL EXAMINATION:  Blood pressure 99/49, pulse 86, respirations 16, O2  saturation 99% on room air.   Her upper back has tenderness in the upper medial scapular border. In  addition, she has some tenderness to palpation in the lower back/buttock  area. She has good muscle strength bilateral lower extremities, normal deep  tendon reflexes, good back range of motion.   IMPRESSION:  1.  Cervical/levator scapular myofascial pain syndrome.  2.  Left SI joint arthropathy, recurrent. Last SI joint injection December 09, 2004. At that time, she went from an 8 to 0/10 pain.   PLAN:  1.  Will do trigger point injections today.  2.  Repeat SI joint injections.      Erick Colace, M.D.  Electronically Signed     AEK/MedQ  D:  05/25/2005 17:42:20  T:  05/26/2005 08:51:33  Job #:  [id number]

## 2011-02-03 NOTE — Op Note (Signed)
NAME:  [name], [name]                         ACCOUNT NO.:  [id number]   MEDICAL RECORD NO.:  [id number]                   PATIENT TYPE:  OIB   LOCATION:  2889                                 FACILITY:  [facility]   PHYSICIAN:  Kathaleen Maser. Pool, M.D.                 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   PROCEDURE:  C4-5 anterior cervical diskectomy and fusion with allograft and  anterior plating.   SURGEON:  Kathaleen Maser. Pool, M.D.   ASSISTANT:  Marland Mcalpine. Elesa Hacker, M.D.   ANESTHESIA:  General endotracheal.   INDICATIONS:  The patient is a 55 year old female with a history of neck and  left upper extremity pain consistent with a left-sided C5 radiculopathy  failing conservative management.  MRI scanning demonstrates evidence of a  large left-sided C4-5 disk herniation with spinal cord and nerve root  compression.  The patient has been counseled as to her options.  She has  decided to proceed with a C4-5 anterior cervical diskectomy and fusion with  allograft and anterior plating for hopeful relief of her symptoms.   DESCRIPTION OF PROCEDURE:  The patient was taken to the operating room and  placed in the supine position.  After a satisfactory level of anesthesia was  achieved, the patient was positioned with her neck slightly extended, held  in place with Holter traction.  The patient's anterior cervical region was  prepped and draped sterilely.  A 10 blade was used to make a linear skin  incision overlying the C4-5 interspace.  This was carried down sharply to  the platysma.  The platysma was then divided vertically and dissection  proceeds along the medial border of the sternocleidomastoid muscle and  carotid sheath.   Trachea and esophagus are mobilized and retracted toward  the left.  Prevertebral fascia is stripped off the anterior spinal column.  The longus colli muscle is then elevated bilaterally using electrocautery.  A deep self-retaining retractor is placed, intraoperative fluoroscopy used,  and the C4-5 level was confirmed.  The disk space is then incised with a 15  blade in rectangular fashion.  A wide disk space clean-out was then achieved  using pituitary rongeurs, upward-angled pituitary rongeurs, forward and  backward-angled Karlin curettes, Kerrison rongeurs, and the high-speed  drill.  All elements of the disk were completely removed down to the level  of the posterior annulus.  The microscope was brought into the field and  used throughout the remainder of the diskectomy, and the remaining aspects  of the annulus and osteophytes were removed using the high-speed drill down  to the level of the posterior longitudinal ligament.  The posterior  longitudinal  ligament was elevated and resected in piecemeal fashion using  Kerrison rongeurs.  The underlying thecal sac was identified.  A wide  central decompression was then performed by undercutting the bodies of C4  and C5.  Along the way a large amount of free disk herniation was  encountered and completely resected.  Decompression then proceeded on the  left-sided C5 foramen.  The C5 nerve root was identified proximally and  completely decompressed.  A wide anterior foraminotomy was performed along  the course of the exiting nerve root.  All elements of the disk herniation  were resected along the way.  Decompression then proceeded out on the right-  sided C5 foramen.  Once again the C5 nerve root was identified and a wide  anterior foraminotomy was performed.  The wound was then irrigated with  antibiotic solution.  Gelfoam was placed topically for hemostasis and found  to be good.  The disk space was distracted and a 7 mm patellar wedge   allograft was then impacted into place and recessed approximately 1 mm from  the anterior cortical surface.  A 23 mm Atlantis anterior cervical plate was  then placed over the C4 and C5 levels.  This was then attached under  fluoroscopic guidance using 13 mm variable-angled screws, two each at C4 and  C5.  All four screws were given a final tightening and found to be solidly  within bone.  The locking screws were engaged at both levels.  Final images  revealed good position of bone grafts and hardware, proper operative level,  with normal alignment of the spine.  The wound was then irrigated with  antibiotic solution.  Gelfoam was placed topically for hemostasis, found to  be good.  Inspected for hemostasis one final time and then closed in typical  fashion.  Steri-Strips and sterile dressing were applied.  There were no  apparent complications.  The patient tolerated the procedure well, and she  returns to the recovery room postop.                                               Henry A. Pool, M.D.    HAP/MEDQ  D:  01/13/2003  T:  01/13/2003  Job:  [id number]

## 2011-02-03 NOTE — Assessment & Plan Note (Signed)
DATE OF BIRTH:  06/24/1956.   MEDICAL RECORD NUMBER:  [id number].   [name] returns today after I last her July 21, 2004. She had a  trigger point injection for neck pain into the upper medial scapular border  on the left side, left upper trapezius. She did well with this and only has  taken 10 of the 20 hydrocodone tablets that I have given her at that time.  She continues to use Lidoderm patch to her neck area. Was wondering if she  could use as many as 2 patches a day. She uses them every other day, and we  went over proper dosing schedule.   Pain is 8/10, mainly in the neck. Has some in the low back and left thigh.  Improves with rest, heat, ice. Made worse with walking, bending, working.   VOCATIONAL:  Was laid off her job August 22, 2004, was not related to her  pain complaints. Her position was terminated. She smokes. She is married.   REVIEW OF SYSTEMS:  Positive for poor sleep and headaches. She does take  Ambien for sleep.   PHYSICAL EXAMINATION:  VITAL SIGNS:  Blood pressure 106/69, pulse 98,  respirations 20, O2 saturation 98% on room air.  GENERAL:  No acute distress. Mood and affect appropriate.   She has no tenderness to palpation in the cervical paraspinals. Does have  some tenderness around C7 spinous process. She has full strength bilateral  upper and lower extremities. Normal muscle tone. She does have mild decrease  sensation on the right side compared to the left side in the upper  extremities in C6, 7, and 8 dermatomes.   IMPRESSION:  1.  Cervical myofascial pain syndrome with cervical facet syndrome. Has      cervical spondylosis.  2.  Lumbar facet syndrome.  3.  Lumbar myofascial pain syndrome.   PLAN:  1.  Increase Lidoderm patch two at a time.  2.  Continue Zanaflex 4 mg b.i.d.  3.  Hydrocodone; takes 1 tablet p.r.n.  4.  Try her off Ambien. Have given her some samples of Rozerem 1 tablet 8 mg      p.o. q.h.s., samples given. If effective,  she will call back for this.      Andr   AEK/MedQ  D:  08/29/2004 16:08:38  T:  08/29/2004 18:00:48  Job #:  [id number]

## 2011-02-03 NOTE — Procedures (Signed)
NAMECHRYSTAL, [name]               ACCOUNT NO.:  [id number]   MEDICAL RECORD NO.:  [id number]          PATIENT TYPE:  REC   LOCATION:  TPC                          FACILITY:  [facility]   PHYSICIAN:  Erick Colace, M.D.DATE OF BIRTH:  04/30/1956   DATE OF PROCEDURE:  07/08/2004  DATE OF DISCHARGE:                                 OPERATIVE REPORT   DATE OF BIRTH:  12-Sep-1956.   MEDICAL RECORD NUMBER:  [id number].   A 55 year old female last seen by me on March 18, 2004.  The last time I saw  her it was really the low back that was bothering her the most.  Now it is  not so much an issue and it is really her upper back and neck pain that is  bothering her for the last several weeks.   CURRENT MEDICATIONS:  She is not on any narcotic analgesics at this time.  Her last narcotic analgesic was prescribed in June.  She did get some Ambien  over the phone last month.  Current medications include Lidoderm patch,  Wellbutrin, Zanaflex and Ambien.  She gets the Wellbutrin through her  primary care.   INTERVAL HISTORY:  As above.  No arm weakness.  The pain is averaging about  8/10.  She does report to have back surgery on April 11, 2004.   SOCIAL HISTORY:  Married.  A pack a day smoker.  Works full time.   REVIEW OF SYSTEMS:  Poor sleep and headaches.   PHYSICAL EXAMINATION:  VITAL SIGNS:  Blood pressure 104/68, pulse 79,  respirations 20, O2 saturation 97% on room air.  GENERAL APPEARANCE:  No acute distress.  Mood and affect appropriate.  NECK:  Tenderness to palpation in bilateral upper trapezius along the upper  border.  Otherwise neck range of motion is good.   IMPRESSION:  1.  Myofascial pain syndrome, neck and upper back.  2.  History of lumbar disk, as well as lumbar facet syndrome.   PLAN:  Trigger points today.  She will follow up in another week or two.  May need to do a second set.   ADDENDUM:  Trigger point injections in bilateral upper trapezius.  Solution  injected  included 0.5 mL of 0.25% Marcaine, 2 mL of 1% lidocaine and 0.5 mL  of 40 mg/mL Kenalog.   Five areas were marked, two on the right side and three on the left side in  the trapezius border.  The most painful was on the right mid clavicular  line, upper trapezius.  This was injected with 1 mL of solution.  The  remaining for  sites were injected with 0.5 mL solution after negative draw back for blood  using a 25 gauge 1-1/4 inch needle inserted to one-half depth.  The patient  tolerated the procedure well.  Post injection instructions given.       AEK/MEDQ  D:  07/08/2004 13:19:04  T:  07/08/2004 14:09:12  Job:  [id number]   cc:   Sherilyn Cooter A. Pool, M.D.  [address]  [place]  [place] [address]  Fax: [phone]   Chad Cordial, M.D.

## 2011-02-03 NOTE — Procedures (Signed)
[name], [name]               ACCOUNT NO.:  [id number]   MEDICAL RECORD NO.:  [id number]          PATIENT TYPE:  REC   LOCATION:  [facility]                          FACILITY:  [facility]   PHYSICIAN:  Erick Colace, M.D.DATE OF BIRTH:  10-05-1955   DATE OF PROCEDURE:  12/09/2004  DATE OF DISCHARGE:                                 OPERATIVE REPORT   MEDICAL RECORD NUMBER:  [id number].   PROCEDURE:  Left sacroiliac joint injection under fluoroscopic guidance.   INDICATIONS:  Left buttock pain, only partially relieved with pain  medications, taken p.o.   Informed consent was obtained after describing risks and benefits of the  procedure to the patient. These include bleeding, bruising, infection, loss  of bowel and bladder function, temporary or permanent paralysis, and she  elects to proceed. The patient placed prone on fluoroscopy table, Betadine  prep, sterile drape. A 25-gauge 1-1/4-inch needle was used to anesthetize  skin and subcu tissues. Then a 22-gauge 3-1/2-inch spinal was inserted under  fluoroscopic guidance in the left SI joint, confirmed with both AP and  lateral imaging and also confirmed with Omnipaque 180 x0.3 cc injected under  live fluoroscopy demonstrating no intravascular uptake and a good arthrogram  spread. Then, a solution containing 0.5 cc of 40 mg/cc Kenalog plus 0.5 cc  of 2% methylparaben-free lidocaine were injected. The patient tolerated the  procedure well. Post injection instructions given.   PROCEDURE:  Trigger point injection.   INDICATIONS:  Bilateral neck pain, only partially responsive to muscle  relaxants and exercises.   The patient was placed in a sitting position. Areas marked bilateral upper  trapezii, left upper medial scapular border, and left C6 paraspinal upper  trapezius lateral border. Prepped with Betadine. Entered with 25-gauge 1-1/4-  inch needle, 0.5 cc of lidocaine 1% was injected at each site, and trigger  point deactivation was  carried out. No twitch response was obtained. Post  injection instructions given.      AEK/MEDQ  D:  12/09/2004 09:45:19  T:  12/09/2004 11:45:18  Job:  [id number]

## 2011-02-03 NOTE — Assessment & Plan Note (Signed)
PATIENT:  [name].   DATE OF BIRTH:  07-25-1956.   SURGEON:  Jewel Baize. Stevphen Rochester, M.D.   [facility]ha [facility] and I evaluated  her via health and history form and 14 point review of systems.   1. Originally scheduled for radiofrequency neural ablation to the facet. I     am going to go ahead and hold off. She is anticipating surgery and has     many questions in this regard. I have reinforced the fact that I am not a     surgeon, and I do not have a particular opinion about surgery either way,     but as she has many questions, she may want to consider a second opinion.     I reassured her that Dr. Jordan Likes is an excellent surgeon, but she is very     anxious about surgery. 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 This is a legitimate concern. This is another     rationale for performing RF to the facet as would expect the medial     branch to have 25 to 50% diminution in pain perception but more     importantly improved range of motion and functional indices, potentially     increasing the likelihood that she would notice that function, weight     loss, and cigarette cessation would improve her overall symptom position.  2. She has no advancing neurological or musculoskeletal features.  3. Review of medications. She is appropriate. She takes hydrocodone     sparingly and appropriately.   We will go ahead and hold off RF and determine further course of care based  on surgical decision making. We do believe that the contralateral RF would  probably help her from a symptomatic standpoint, particularly if we use a  larger field lesion, 10 mm, x2.   We also recommend weight control, home based therapy, cigarette cessation,  and maintain contact with primary care.   OBJECTIVE:  Diffuse paralumbar myofascial discomfort. Pain over PSIS.  Notable  pain on extension. No new neurological findings motor, sensory or  reflexive.   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   PLAN:  Conservative management. Follow up p.r.n. Discharge instructions  given. Lifestyle enhancements reviewed.      Celene Kras, MD   HH/MedQ  D:  03/22/2004 09:07:01  T:  03/22/2004 10:39:55  Job #:  [id number]

## 2011-03-20 ENCOUNTER — Ambulatory Visit
Admission: RE | Admit: 2011-03-20 | Discharge: 2011-03-20 | Disposition: A | Discharge status: Home / Self Care | Payer: 59 | Source: Ambulatory Visit | Attending: Family Medicine | Admitting: Family Medicine

## 2011-03-20 ENCOUNTER — Other Ambulatory Visit: Payer: Self-pay | Admitting: Family Medicine

## 2011-03-20 DIAGNOSIS — R634 Abnormal weight loss: Secondary | ICD-10-CM

## 2011-03-21 ENCOUNTER — Other Ambulatory Visit: Payer: Self-pay | Admitting: Family Medicine

## 2011-03-21 ENCOUNTER — Ambulatory Visit
Admission: RE | Admit: 2011-03-21 | Discharge: 2011-03-21 | Disposition: A | Discharge status: Home / Self Care | Payer: Medicare Other | Source: Ambulatory Visit | Attending: Family Medicine | Admitting: Family Medicine

## 2011-03-21 DIAGNOSIS — R911 Solitary pulmonary nodule: Secondary | ICD-10-CM

## 2011-09-04 ENCOUNTER — Other Ambulatory Visit: Payer: Self-pay | Admitting: Anesthesiology

## 2011-09-04 ENCOUNTER — Ambulatory Visit
Admission: RE | Admit: 2011-09-04 | Discharge: 2011-09-04 | Disposition: A | Discharge status: Home / Self Care | Payer: 59 | Source: Ambulatory Visit | Attending: Anesthesiology | Admitting: Anesthesiology

## 2011-09-04 DIAGNOSIS — M549 Dorsalgia, unspecified: Secondary | ICD-10-CM

## 2011-10-02 DIAGNOSIS — IMO0001 Reserved for inherently not codable concepts without codable children: Secondary | ICD-10-CM | POA: Diagnosis not present

## 2011-10-02 DIAGNOSIS — M25519 Pain in unspecified shoulder: Secondary | ICD-10-CM | POA: Diagnosis not present

## 2011-10-02 DIAGNOSIS — M47817 Spondylosis without myelopathy or radiculopathy, lumbosacral region: Secondary | ICD-10-CM | POA: Diagnosis not present

## 2011-10-18 DIAGNOSIS — F3189 Other bipolar disorder: Secondary | ICD-10-CM | POA: Diagnosis not present

## 2011-11-06 DIAGNOSIS — M47817 Spondylosis without myelopathy or radiculopathy, lumbosacral region: Secondary | ICD-10-CM | POA: Diagnosis not present

## 2011-11-06 DIAGNOSIS — F3289 Other specified depressive episodes: Secondary | ICD-10-CM | POA: Diagnosis not present

## 2011-11-06 DIAGNOSIS — M25519 Pain in unspecified shoulder: Secondary | ICD-10-CM | POA: Diagnosis not present

## 2011-11-06 DIAGNOSIS — F329 Major depressive disorder, single episode, unspecified: Secondary | ICD-10-CM | POA: Diagnosis not present

## 2011-11-09 DIAGNOSIS — F3189 Other bipolar disorder: Secondary | ICD-10-CM | POA: Diagnosis not present

## 2011-11-16 DIAGNOSIS — F3189 Other bipolar disorder: Secondary | ICD-10-CM | POA: Diagnosis not present

## 2012-01-22 ENCOUNTER — Ambulatory Visit
Admission: RE | Admit: 2012-01-22 | Discharge: 2012-01-22 | Disposition: A | Discharge status: Home / Self Care | Payer: 59 | Source: Ambulatory Visit | Attending: Physician Assistant | Admitting: Physician Assistant

## 2012-01-22 ENCOUNTER — Other Ambulatory Visit: Payer: Self-pay | Admitting: Physician Assistant

## 2012-01-22 DIAGNOSIS — M7150 Other bursitis, not elsewhere classified, unspecified site: Secondary | ICD-10-CM | POA: Diagnosis not present

## 2012-01-22 DIAGNOSIS — M47817 Spondylosis without myelopathy or radiculopathy, lumbosacral region: Secondary | ICD-10-CM | POA: Diagnosis not present

## 2012-01-22 DIAGNOSIS — M25552 Pain in left hip: Secondary | ICD-10-CM

## 2012-01-24 DIAGNOSIS — M7150 Other bursitis, not elsewhere classified, unspecified site: Secondary | ICD-10-CM | POA: Diagnosis not present

## 2012-02-21 DIAGNOSIS — F3189 Other bipolar disorder: Secondary | ICD-10-CM | POA: Diagnosis not present

## 2012-02-22 DIAGNOSIS — M47817 Spondylosis without myelopathy or radiculopathy, lumbosacral region: Secondary | ICD-10-CM | POA: Diagnosis not present

## 2012-04-09 DIAGNOSIS — F3189 Other bipolar disorder: Secondary | ICD-10-CM | POA: Diagnosis not present

## 2012-05-09 DIAGNOSIS — G5710 Meralgia paresthetica, unspecified lower limb: Secondary | ICD-10-CM | POA: Diagnosis not present

## 2012-05-09 DIAGNOSIS — M62838 Other muscle spasm: Secondary | ICD-10-CM | POA: Diagnosis not present

## 2012-05-09 DIAGNOSIS — M47817 Spondylosis without myelopathy or radiculopathy, lumbosacral region: Secondary | ICD-10-CM | POA: Diagnosis not present

## 2012-05-09 DIAGNOSIS — R52 Pain, unspecified: Secondary | ICD-10-CM | POA: Diagnosis not present

## 2012-06-10 ENCOUNTER — Ambulatory Visit
Admission: RE | Admit: 2012-06-10 | Discharge: 2012-06-10 | Disposition: A | Discharge status: Home / Self Care | Payer: 59 | Source: Ambulatory Visit | Attending: Physician Assistant | Admitting: Physician Assistant

## 2012-06-10 ENCOUNTER — Other Ambulatory Visit: Payer: Self-pay | Admitting: Physician Assistant

## 2012-06-10 DIAGNOSIS — IMO0002 Reserved for concepts with insufficient information to code with codable children: Secondary | ICD-10-CM | POA: Diagnosis not present

## 2012-06-10 DIAGNOSIS — G5710 Meralgia paresthetica, unspecified lower limb: Secondary | ICD-10-CM | POA: Diagnosis not present

## 2012-06-10 DIAGNOSIS — R52 Pain, unspecified: Secondary | ICD-10-CM

## 2012-06-10 DIAGNOSIS — M47817 Spondylosis without myelopathy or radiculopathy, lumbosacral region: Secondary | ICD-10-CM | POA: Diagnosis not present

## 2012-06-24 DIAGNOSIS — M47817 Spondylosis without myelopathy or radiculopathy, lumbosacral region: Secondary | ICD-10-CM | POA: Diagnosis not present

## 2012-06-24 DIAGNOSIS — M5430 Sciatica, unspecified side: Secondary | ICD-10-CM | POA: Diagnosis not present

## 2012-06-29 DIAGNOSIS — Z23 Encounter for immunization: Secondary | ICD-10-CM | POA: Diagnosis not present

## 2012-07-17 DIAGNOSIS — F3189 Other bipolar disorder: Secondary | ICD-10-CM | POA: Diagnosis not present

## 2012-07-22 DIAGNOSIS — M47817 Spondylosis without myelopathy or radiculopathy, lumbosacral region: Secondary | ICD-10-CM | POA: Diagnosis not present

## 2012-07-22 DIAGNOSIS — M542 Cervicalgia: Secondary | ICD-10-CM | POA: Diagnosis not present

## 2012-07-22 DIAGNOSIS — IMO0001 Reserved for inherently not codable concepts without codable children: Secondary | ICD-10-CM | POA: Diagnosis not present

## 2012-07-25 ENCOUNTER — Ambulatory Visit
Admission: RE | Admit: 2012-07-25 | Discharge: 2012-07-25 | Disposition: A | Discharge status: Home / Self Care | Payer: 59 | Source: Ambulatory Visit | Attending: Physician Assistant | Admitting: Physician Assistant

## 2012-07-25 ENCOUNTER — Other Ambulatory Visit: Payer: Self-pay | Admitting: Physician Assistant

## 2012-07-25 DIAGNOSIS — M5382 Other specified dorsopathies, cervical region: Secondary | ICD-10-CM

## 2012-08-30 DIAGNOSIS — R5381 Other malaise: Secondary | ICD-10-CM | POA: Diagnosis not present

## 2012-08-30 DIAGNOSIS — Z23 Encounter for immunization: Secondary | ICD-10-CM | POA: Diagnosis not present

## 2012-08-30 DIAGNOSIS — E039 Hypothyroidism, unspecified: Secondary | ICD-10-CM | POA: Diagnosis not present

## 2012-08-30 DIAGNOSIS — F17200 Nicotine dependence, unspecified, uncomplicated: Secondary | ICD-10-CM | POA: Diagnosis not present

## 2012-08-30 DIAGNOSIS — K219 Gastro-esophageal reflux disease without esophagitis: Secondary | ICD-10-CM | POA: Diagnosis not present

## 2012-08-30 DIAGNOSIS — R5383 Other fatigue: Secondary | ICD-10-CM | POA: Diagnosis not present

## 2012-08-30 DIAGNOSIS — J449 Chronic obstructive pulmonary disease, unspecified: Secondary | ICD-10-CM | POA: Diagnosis not present

## 2012-09-30 DIAGNOSIS — M47817 Spondylosis without myelopathy or radiculopathy, lumbosacral region: Secondary | ICD-10-CM | POA: Diagnosis not present

## 2012-09-30 DIAGNOSIS — IMO0001 Reserved for inherently not codable concepts without codable children: Secondary | ICD-10-CM | POA: Diagnosis not present

## 2012-09-30 DIAGNOSIS — M25519 Pain in unspecified shoulder: Secondary | ICD-10-CM | POA: Diagnosis not present

## 2012-09-30 DIAGNOSIS — G5710 Meralgia paresthetica, unspecified lower limb: Secondary | ICD-10-CM | POA: Diagnosis not present

## 2012-10-04 DIAGNOSIS — F3189 Other bipolar disorder: Secondary | ICD-10-CM | POA: Diagnosis not present

## 2012-12-05 DIAGNOSIS — F3189 Other bipolar disorder: Secondary | ICD-10-CM | POA: Diagnosis not present

## 2012-12-26 DIAGNOSIS — IMO0001 Reserved for inherently not codable concepts without codable children: Secondary | ICD-10-CM | POA: Diagnosis not present

## 2012-12-27 DIAGNOSIS — F3189 Other bipolar disorder: Secondary | ICD-10-CM | POA: Diagnosis not present

## 2013-01-22 DIAGNOSIS — L723 Sebaceous cyst: Secondary | ICD-10-CM | POA: Diagnosis not present

## 2013-01-22 DIAGNOSIS — D235 Other benign neoplasm of skin of trunk: Secondary | ICD-10-CM | POA: Diagnosis not present

## 2013-01-22 DIAGNOSIS — L821 Other seborrheic keratosis: Secondary | ICD-10-CM | POA: Diagnosis not present

## 2013-02-20 DIAGNOSIS — J029 Acute pharyngitis, unspecified: Secondary | ICD-10-CM | POA: Diagnosis not present

## 2013-02-24 DIAGNOSIS — M461 Sacroiliitis, not elsewhere classified: Secondary | ICD-10-CM | POA: Diagnosis not present

## 2013-02-24 DIAGNOSIS — Z79899 Other long term (current) drug therapy: Secondary | ICD-10-CM | POA: Diagnosis not present

## 2013-02-24 DIAGNOSIS — M47817 Spondylosis without myelopathy or radiculopathy, lumbosacral region: Secondary | ICD-10-CM | POA: Diagnosis not present

## 2013-03-10 DIAGNOSIS — J040 Acute laryngitis: Secondary | ICD-10-CM | POA: Diagnosis not present

## 2013-03-10 DIAGNOSIS — J0100 Acute maxillary sinusitis, unspecified: Secondary | ICD-10-CM | POA: Diagnosis not present

## 2013-05-06 DIAGNOSIS — K219 Gastro-esophageal reflux disease without esophagitis: Secondary | ICD-10-CM | POA: Diagnosis not present

## 2013-05-06 DIAGNOSIS — J309 Allergic rhinitis, unspecified: Secondary | ICD-10-CM | POA: Diagnosis not present

## 2013-05-12 DIAGNOSIS — M47817 Spondylosis without myelopathy or radiculopathy, lumbosacral region: Secondary | ICD-10-CM | POA: Diagnosis not present

## 2013-05-12 DIAGNOSIS — M542 Cervicalgia: Secondary | ICD-10-CM | POA: Diagnosis not present

## 2013-06-30 DIAGNOSIS — M25519 Pain in unspecified shoulder: Secondary | ICD-10-CM | POA: Diagnosis not present

## 2013-06-30 DIAGNOSIS — M47817 Spondylosis without myelopathy or radiculopathy, lumbosacral region: Secondary | ICD-10-CM | POA: Diagnosis not present

## 2013-07-15 DIAGNOSIS — M25519 Pain in unspecified shoulder: Secondary | ICD-10-CM | POA: Diagnosis not present

## 2013-07-15 DIAGNOSIS — M25819 Other specified joint disorders, unspecified shoulder: Secondary | ICD-10-CM | POA: Diagnosis not present

## 2013-08-05 ENCOUNTER — Ambulatory Visit
Admission: RE | Admit: 2013-08-05 | Discharge: 2013-08-05 | Disposition: A | Discharge status: Home / Self Care | Payer: 59 | Source: Ambulatory Visit | Attending: Physician Assistant | Admitting: Physician Assistant

## 2013-08-05 ENCOUNTER — Other Ambulatory Visit: Payer: Self-pay | Admitting: Physician Assistant

## 2013-08-05 DIAGNOSIS — R0781 Pleurodynia: Secondary | ICD-10-CM

## 2013-08-05 DIAGNOSIS — T1490XA Injury, unspecified, initial encounter: Secondary | ICD-10-CM

## 2013-08-05 DIAGNOSIS — R109 Unspecified abdominal pain: Secondary | ICD-10-CM | POA: Diagnosis not present

## 2013-08-05 DIAGNOSIS — R079 Chest pain, unspecified: Secondary | ICD-10-CM | POA: Diagnosis not present

## 2013-08-22 DIAGNOSIS — H9319 Tinnitus, unspecified ear: Secondary | ICD-10-CM | POA: Diagnosis not present

## 2013-09-22 DIAGNOSIS — IMO0002 Reserved for concepts with insufficient information to code with codable children: Secondary | ICD-10-CM | POA: Diagnosis not present

## 2013-09-22 DIAGNOSIS — Z79899 Other long term (current) drug therapy: Secondary | ICD-10-CM | POA: Diagnosis not present

## 2013-09-22 DIAGNOSIS — M47817 Spondylosis without myelopathy or radiculopathy, lumbosacral region: Secondary | ICD-10-CM | POA: Diagnosis not present

## 2013-10-06 DIAGNOSIS — J449 Chronic obstructive pulmonary disease, unspecified: Secondary | ICD-10-CM | POA: Diagnosis not present

## 2013-10-06 DIAGNOSIS — K219 Gastro-esophageal reflux disease without esophagitis: Secondary | ICD-10-CM | POA: Diagnosis not present

## 2013-10-06 DIAGNOSIS — E039 Hypothyroidism, unspecified: Secondary | ICD-10-CM | POA: Diagnosis not present

## 2013-10-06 DIAGNOSIS — M810 Age-related osteoporosis without current pathological fracture: Secondary | ICD-10-CM | POA: Diagnosis not present

## 2013-11-18 DIAGNOSIS — M25819 Other specified joint disorders, unspecified shoulder: Secondary | ICD-10-CM | POA: Diagnosis not present

## 2013-11-18 DIAGNOSIS — M25519 Pain in unspecified shoulder: Secondary | ICD-10-CM | POA: Diagnosis not present

## 2013-12-01 DIAGNOSIS — F3189 Other bipolar disorder: Secondary | ICD-10-CM | POA: Diagnosis not present

## 2013-12-01 DIAGNOSIS — M25519 Pain in unspecified shoulder: Secondary | ICD-10-CM | POA: Diagnosis not present

## 2013-12-05 DIAGNOSIS — M25519 Pain in unspecified shoulder: Secondary | ICD-10-CM | POA: Diagnosis not present

## 2013-12-31 DIAGNOSIS — M129 Arthropathy, unspecified: Secondary | ICD-10-CM | POA: Diagnosis not present

## 2013-12-31 DIAGNOSIS — M47817 Spondylosis without myelopathy or radiculopathy, lumbosacral region: Secondary | ICD-10-CM | POA: Diagnosis not present

## 2013-12-31 DIAGNOSIS — M25519 Pain in unspecified shoulder: Secondary | ICD-10-CM | POA: Diagnosis not present

## 2013-12-31 DIAGNOSIS — Z79899 Other long term (current) drug therapy: Secondary | ICD-10-CM | POA: Diagnosis not present

## 2014-01-01 DIAGNOSIS — M67919 Unspecified disorder of synovium and tendon, unspecified shoulder: Secondary | ICD-10-CM | POA: Diagnosis not present

## 2014-01-01 DIAGNOSIS — M25819 Other specified joint disorders, unspecified shoulder: Secondary | ICD-10-CM | POA: Diagnosis not present

## 2014-01-01 DIAGNOSIS — M25519 Pain in unspecified shoulder: Secondary | ICD-10-CM | POA: Diagnosis not present

## 2014-01-01 DIAGNOSIS — M719 Bursopathy, unspecified: Secondary | ICD-10-CM | POA: Diagnosis not present

## 2014-01-01 DIAGNOSIS — M7500 Adhesive capsulitis of unspecified shoulder: Secondary | ICD-10-CM | POA: Diagnosis not present

## 2014-01-20 ENCOUNTER — Other Ambulatory Visit: Payer: Self-pay | Admitting: Orthopedic Surgery

## 2014-01-26 DIAGNOSIS — F3189 Other bipolar disorder: Secondary | ICD-10-CM | POA: Diagnosis not present

## 2014-01-28 DIAGNOSIS — M542 Cervicalgia: Secondary | ICD-10-CM | POA: Diagnosis not present

## 2014-01-28 DIAGNOSIS — M47817 Spondylosis without myelopathy or radiculopathy, lumbosacral region: Secondary | ICD-10-CM | POA: Diagnosis not present

## 2014-01-28 DIAGNOSIS — M25519 Pain in unspecified shoulder: Secondary | ICD-10-CM | POA: Diagnosis not present

## 2014-01-28 DIAGNOSIS — G5710 Meralgia paresthetica, unspecified lower limb: Secondary | ICD-10-CM | POA: Diagnosis not present

## 2014-02-05 ENCOUNTER — Encounter (HOSPITAL_COMMUNITY): Payer: Self-pay | Admitting: Pharmacy Technician

## 2014-02-10 ENCOUNTER — Other Ambulatory Visit: Payer: Self-pay | Admitting: Orthopedic Surgery

## 2014-02-10 NOTE — H&P (Signed)
[name] DOB: 04/05/1956 Married / Language: English / Race: White Female  Chief Complaint: R shoulder pain  History of Present Illness The patient is a 58 year old female who presents today for follow up of their shoulder. The patient is being followed for their right shoulder pain. They are 8 week(s) out from a flare up. Symptoms reported today include: pain. and report their pain level to be 7 / 10. Current treatment includes: activity modification and pain medications. The following medication has been used for pain control: Hydrocodone (will be switching to Percocet tomorrow). The patient presents today following MRI.   [name] follows up today for her R shoulder to review MRI. Initially her symptoms started in October without injury. She was seen by Dr. Tonita Cong and had a steroid injection with relief. Her pain returned 2 months ago without change in activity or new injury with more severe pain which did not respond to her last steroid injection at the beginning of March. She is c/o ongoing severe pain, now constant, stiffness and weakness at the shoulder. Pain mgmt has switched her to Percocet 10mg  for pain which she will start tomorrow (Dr. Hardin Negus). She denies neck pain, numbness, tingling. She is having trouble sleeping due to pain. She has been doing PT with no improvement of her symptoms.  Past Medical Hx COPD Chronic Pain Fibromyalgia Migraines DJD  Past Surgical Hx R knee scope R oophorectomy B/L foot surgery Partial hysterectomy (non-cancerous) B/L breast reduction Neck disk surgery Spinal surgery Tubal ligation  Allergies Fiorinal *ANALGESICS - NonNarcotic* KlonoPIN *ANTICONVULSANTS* Neurontin *ANTICONVULSANTS*  Family History Heart Disease. mother, grandmother mothers side and grandmother fathers side Drug / Alcohol Addiction. mother, father, sister and grandmother fathers side Heart disease in female family member before age 18 Severe allergy.  mother Osteoporosis. mother Depression. sister Cancer. grandmother mothers side Rheumatoid Arthritis. First Degree Relatives. mother and sister Cerebrovascular Accident. mother Congestive Heart Failure. grandfather mothers side and grandmother fathers side Chronic Obstructive Lung Disease. mother, father and grandfather mothers side  Social History Tobacco use. Former smoker. former smoker; smoke(d) 1/2 pack(s) per day  Medication History Hydrocodone-Acetaminophen ( Oral) Specific dose unknown - Active. Topamax (100MG  Tablet, Oral) Active. Vitamin D (Ergocalciferol) (50000UNIT Capsule, Oral) Active. Dexilant (60MG  Capsule DR, Oral) Active. Synthroid (75MCG Tablet, Oral) Active. Latuda (60MG  Tablet, Oral) Active. ALPRAZolam (0.5MG  Tablet, Oral) Active. Prolia (60MG /ML Solution, Subcutaneous) Active. ProAir HFA (108 (90 Base)MCG/ACT Aerosol Soln, Inhalation) Active. Percocet (10-325MG  Tablet, Oral) Active. (will start 01/02/2014)  Review of Systems General:Not Present- Fever and Weight Loss. Skin:Not Present- Rash. Cardiovascular:Not Present- Shortness of Breath and Chest Pain. Musculoskeletal:Present- Joint Stiffness, Joint Swelling and Joint Pain. Neurological:Not Present- Numbness, Difficulty with balance, Weakness, Loss of bowel control and Loss of bladder control. Endocrine:Not Present- Excessive Thirst and Excessive Urination. Hematology:Not Present- Easy Bruising.  Physical Exam The physical exam findings are as follows:  General Mental Status - Alert. General Appearance- pleasant and In acute distress. Orientation- Oriented X3. Build & Nutrition- Well nourished and Well developed. Gait- Unassisted and unimpaired.  Musculoskeletal Upper Extremity Right Upper Extremity: Right Shoulder: Inspection and Palpation:Tenderness- deltoid tender to palpation and subacromial space tender to palpation. no tenderness to palpation of the [facility] joint, no  tenderness to palpation of the [place] joint and no tenderness to palpation of the clavicle. Swelling- none. Sensation- intact to light touch. Skin:Color- no ecchymosis and no erythema. ROM: Internal Rotation:AROM- severely decreased and painful. External Rotation:AROM- mildly decreased. Flexion:AROM- moderately decreased (to 90 degrees when isolated)  and painful. Glenohumeral Abduction:AROM- moderately decreased and painful. Testing limited- due to pain. Strength and Tone:Biceps- 5/5. Deltoid- normal. Triceps- 5/5. Abduction- 4+/5. Internal Rotation- 5/5. External Rotation- 4+/5. Rotator Cuff- normal. Instability- sulcus sign negative. Impingement- impingement sign positive and secondary impingement sign positive. Deformities/Malalignments/Discrepancies- no deformities noted. Special Testing- Speed's test negative.  Imaging xrays with AC arthrosis. Type 2 acromion, slight hook noted, with decreased subacromial space.  MRI R shoulder images and report with insterstitial tearing noted supraspinatus and infraspinatus tendons, which does appear more significant than stated on MRI report. Nondisplaced anterior labral tear. Moderate to severe AC DJD with undersurface spurring.  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 We discussed relevant anatomy. With her ongoing symptoms and MRI findings, recommend proceeding with surgery. At this point, recommend proceeding with R shoulder arthroscopy, SAD, EUA, MUA, and possible mini-open RCR. Discussed the procedure itself as well as risks, complications and alternatives, including but not limited to DVT, PE,  infx, bleeding, failure of procedure, need for secondary procedure, ongoing pain/symptoms, anesthesia risk, even stroke or death. Also discussed typical post-op protocols, time out of work, ice and elevation, PT phases, HEP, use of the sling. Discussed importance of activity modifications long term to avoid reinjury. Patient desires to proceed with surgery.  I had a long discussion with the patient concerning the risks and benefits of a rotator cuff repair, including bleeding, infection, prolonged postoperative recovery, which may require 3 to 5 months until maximum medical improvement. Overight procedure with initiation of early passive range of motion within physical therapy. Avoid any active motion for the first six weeks. This is all in an effort to avoid recurrent tear of the rotator cuff and adhesive capsulitis. Return to work without use of the arm can be obtained following two weeks. However, driving will be a challenge. We also discussed the possibility of requiring implants including bone anchors,as well as an Allograft patch graft if a massive rotator cuff tear is encountered. Removal of any bones for spurs as well as bursitis will be performed during the procedure and also any associated anesthetic complications as well.  She notes questionable hx of exposure to MRSA, her husband did have an infection in the hospital and she is not sure what it was, but he did have to use ointment in his nose. She was given MRSA decolonization Rx's. She is in pain mgmt and has Rx for Percocet. Discussed continued activity modifications. Will obtain pre-op clearance from her PCP Dr. [name]. Will put a hold on formal PT and resume post-op. We will proceed accordingly. She will follow up 10-14 days post-op for suture removal and will call with any questions or concerns in the interim.  Plan R Shoulder arthroscopy, SAD, EUA, MUA, possible mini-open RCR  Signed electronically by Lacie Draft PA-C for Dr.  Tonita Cong

## 2014-02-11 ENCOUNTER — Encounter (HOSPITAL_COMMUNITY)
Admission: RE | Admit: 2014-02-11 | Discharge: 2014-02-11 | Disposition: A | Discharge status: Home / Self Care | Payer: 59 | Source: Ambulatory Visit | Attending: Specialist | Admitting: Specialist

## 2014-02-11 ENCOUNTER — Encounter (HOSPITAL_COMMUNITY): Payer: Self-pay

## 2014-02-11 DIAGNOSIS — Z01812 Encounter for preprocedural laboratory examination: Secondary | ICD-10-CM | POA: Insufficient documentation

## 2014-02-11 HISTORY — DX: Bipolar disorder, unspecified: F31.9

## 2014-02-11 HISTORY — DX: Diverticulitis of intestine, part unspecified, without perforation or abscess without bleeding: K57.92

## 2014-02-11 HISTORY — DX: Lichen simplex chronicus: L28.0

## 2014-02-11 HISTORY — DX: Vitamin D deficiency, unspecified: E55.9

## 2014-02-11 HISTORY — DX: Gastro-esophageal reflux disease without esophagitis: K21.9

## 2014-02-11 HISTORY — DX: Hypothyroidism, unspecified: E03.9

## 2014-02-11 HISTORY — DX: Age-related osteoporosis without current pathological fracture: M81.0

## 2014-02-11 HISTORY — DX: Personal history of self-harm: Z91.5

## 2014-02-11 HISTORY — DX: Nonrheumatic mitral (valve) prolapse: I34.1

## 2014-02-11 HISTORY — DX: Pain, unspecified: R52

## 2014-02-11 HISTORY — DX: Chronic obstructive pulmonary disease, unspecified: J44.9

## 2014-02-11 HISTORY — DX: Irritable bowel syndrome, unspecified: K58.9

## 2014-02-11 LAB — CBC
HEMATOCRIT: 41.8 % (ref 36.0–46.0)
HEMOGLOBIN: 14.1 g/dL (ref 12.0–15.0)
MCH: 29.8 pg (ref 26.0–34.0)
MCHC: 33.7 g/dL (ref 30.0–36.0)
MCV: 88.4 fL (ref 78.0–100.0)
Platelets: 249 10*3/uL (ref 150–400)
RBC: 4.73 MIL/uL (ref 3.87–5.11)
RDW: 13.2 % (ref 11.5–15.5)
WBC: 5.4 10*3/uL (ref 4.0–10.5)

## 2014-02-11 LAB — BASIC METABOLIC PANEL
BUN: 9 mg/dL (ref 6–23)
CO2: 24 mEq/L (ref 19–32)
Calcium: 9.6 mg/dL (ref 8.4–10.5)
Chloride: 105 mEq/L (ref 96–112)
Creatinine, Ser: 0.71 mg/dL (ref 0.50–1.10)
GFR calc non Af Amer: >90 mL/min (ref >90)
GLUCOSE: 97 mg/dL (ref 70–99)
POTASSIUM: 4.5 meq/L (ref 3.7–5.3)
Sodium: 141 mEq/L (ref 137–147)

## 2014-02-11 NOTE — Patient Instructions (Signed)
YOUR SURGERY IS SCHEDULED AT [facility]  ON:  Thursday  6/4  REPORT TO  SHORT STAY CENTER AT:  5:30 AM   PLEASE COME IN THE [place] ENTRANCE AND FOLLOW SIGNS TO SHORT STAY CENTER.  DO NOT EAT OR DRINK ANYTHING AFTER MIDNIGHT THE NIGHT BEFORE YOUR SURGERY.  YOU MAY BRUSH YOUR TEETH, RINSE OUT YOUR MOUTH--BUT NO WATER, NO FOOD, NO CHEWING GUM, NO MINTS, NO CANDIES, NO CHEWING TOBACCO.  PLEASE TAKE THE FOLLOWING MEDICATIONS THE AM OF YOUR SURGERY WITH A FEW SIPS OF WATER:  ALPRAZOLAM, DEXILANT, TOPAMAX.  USE YOUR INHALER AND BRING TO HOSPITAL.  IF YOU HAVE SLEEP APNEA AND USE CPAP OR BIPAP--PLEASE BRING THE MASK AND THE TUBING.  DO NOT BRING YOUR MACHINE.  DO NOT BRING VALUABLES, MONEY, CREDIT CARDS.  DO NOT WEAR JEWELRY, MAKE-UP, NAIL POLISH AND NO METAL PINS OR CLIPS IN YOUR HAIR. CONTACT LENS, DENTURES / PARTIALS, GLASSES SHOULD NOT BE WORN TO SURGERY AND IN MOST CASES-HEARING AIDS WILL NEED TO BE REMOVED.  BRING YOUR GLASSES CASE, ANY EQUIPMENT NEEDED FOR YOUR CONTACT LENS. FOR PATIENTS ADMITTED TO THE HOSPITAL--CHECK OUT TIME THE DAY OF DISCHARGE IS 11:00 AM.  ALL INPATIENT ROOMS ARE PRIVATE - WITH BATHROOM, TELEPHONE, TELEVISION AND WIFI INTERNET.  IF YOU ARE BEING DISCHARGED THE SAME DAY OF YOUR SURGERY--YOU CAN NOT DRIVE YOURSELF HOME--AND SHOULD NOT GO HOME ALONE BY TAXI OR BUS.  NO DRIVING OR OPERATING MACHINERY, OR MAKING LEGAL DECISIONS FOR 24 HOURS FOLLOWING ANESTHESIA / PAIN MEDICATIONS.  PLEASE MAKE ARRANGEMENTS FOR SOMEONE TO BE WITH YOU AT HOME THE FIRST 24 HOURS AFTER SURGERY. RESPONSIBLE DRIVER'S NAME / PHONE                                                   PLEASE READ OVER ANY  FACT SHEETS THAT YOU WERE GIVEN: MRSA INFORMATION, BLOOD TRANSFUSION INFORMATION, INCENTIVE SPIROMETER INFORMATION.  PLEASE BE AWARE THAT YOU MAY NEED ADDITIONAL BLOOD DRAWN DAY OF YOUR  SURGERY  _______________________________________________________________________   [facility] - Preparing for Surgery Before surgery, you can play an important role.  Because skin is not sterile, your skin needs to be as free of germs as possible.  You can reduce the number of germs on your skin by washing with CHG (chlorahexidine gluconate) soap before surgery.  CHG is an antiseptic cleaner which kills germs and bonds with the skin to continue killing germs even after washing. Please DO NOT use if you have an allergy to CHG or antibacterial soaps.  If your skin becomes reddened/irritated stop using the CHG and inform your nurse when you arrive at Short Stay. Do not shave (including legs and underarms) for at least 48 hours prior to the first CHG shower.  You may shave your face/neck. Please follow these instructions carefully:  1.  Shower with CHG Soap the night before surgery and the  morning of Surgery.  2.  If you choose to wash your hair, wash your hair first as usual with your  normal  shampoo.  3.  After you shampoo, rinse your hair and body thoroughly to remove the  shampoo.                           4.  Use CHG as you  would any other liquid soap.  You can apply chg directly  to the skin and wash                       Gently with a scrungie or clean washcloth.  5.  Apply the CHG Soap to your body ONLY FROM THE NECK DOWN.   Do not use on face/ open                           Wound or open sores. Avoid contact with eyes, ears mouth and genitals (private parts).                       Wash face,  Genitals (private parts) with your normal soap.             6.  Wash thoroughly, paying special attention to the area where your surgery  will be performed.  7.  Thoroughly rinse your body with warm water from the neck down.  8.  DO NOT shower/wash with your normal soap after using and rinsing off  the CHG Soap.                9.  Pat yourself dry with a clean towel.            10.  Wear clean  pajamas.            11.  Place clean sheets on your bed the night of your first shower and do not  sleep with pets. Day of Surgery : Do not apply any lotions/deodorants the morning of surgery.  Please wear clean clothes to the hospital/surgery center.  FAILURE TO FOLLOW THESE INSTRUCTIONS MAY RESULT IN THE CANCELLATION OF YOUR SURGERY PATIENT SIGNATURE_________________________________  NURSE SIGNATURE__________________________________  ________________________________________________________________________   [name]  An incentive spirometer is a tool that can help keep your lungs clear and active. This tool measures how well you are filling your lungs with each breath. Taking long deep breaths may help reverse or decrease the chance of developing breathing (pulmonary) problems (especially infection) following:  A long period of time when you are unable to move or be active. BEFORE THE PROCEDURE   If the spirometer includes an indicator to show your best effort, your nurse or respiratory therapist will set it to a desired goal.  If possible, sit up straight or lean slightly forward. Try not to slouch.  Hold the incentive spirometer in an upright position. INSTRUCTIONS FOR USE  1. Sit on the edge of your bed if possible, or sit up as far as you can in bed or on a chair. 2. Hold the incentive spirometer in an upright position. 3. Breathe out normally. 4. Place the mouthpiece in your mouth and seal your lips tightly around it. 5. Breathe in slowly and as deeply as possible, raising the piston or the ball toward the top of the column. 6. Hold your breath for 3-5 seconds or for as long as possible. Allow the piston or ball to fall to the bottom of the column. 7. Remove the mouthpiece from your mouth and breathe out normally. 8. Rest for a few seconds and repeat Steps 1 through 7 at least 10 times every 1-2 hours when you are awake. Take your time and take a few normal breaths  between deep breaths. 9. The spirometer may include an indicator to show your best effort. Use the  indicator as a goal to work toward during each repetition. 10. After each set of 10 deep breaths, practice coughing to be sure your lungs are clear. If you have an incision (the cut made at the time of surgery), support your incision when coughing by placing a pillow or rolled up towels firmly against it. Once you are able to get out of bed, walk around indoors and cough well. You may stop using the incentive spirometer when instructed by your caregiver.  RISKS AND COMPLICATIONS  Take your time so you do not get dizzy or light-headed.  If you are in pain, you may need to take or ask for pain medication before doing incentive spirometry. It is harder to take a deep breath if you are having pain. AFTER USE  Rest and breathe slowly and easily.  It can be helpful to keep track of a log of your progress. Your caregiver can provide you with a simple table to help with this. If you are using the spirometer at home, follow these instructions: [place] IF:   You are having difficultly using the spirometer.  You have trouble using the spirometer as often as instructed.  Your pain medication is not giving enough relief while using the spirometer.  You develop fever of 100.5 F (38.1 C) or higher. SEEK IMMEDIATE MEDICAL CARE IF:   You cough up bloody sputum that had not been present before.  You develop fever of 102 F (38.9 C) or greater.  You develop worsening pain at or near the incision site. MAKE SURE YOU:   Understand these instructions.  Will watch your condition.  Will get help right away if you are not doing well or get worse. Document Released: 01/15/2007 Document Revised: 11/27/2011 Document Reviewed: 03/18/2007 [facility] Patient Information 2014 [place], [place].   ________________________________________________________________________

## 2014-02-11 NOTE — Pre-Procedure Instructions (Signed)
EKG REPORT ON PT'S CHART FROM DR. C. WHITE - DONE 01-12-14. CXR REPORT IN EPIC FROM 08-05-13. PT STATES DR. Tonita Cong GAVE HER PRESCRIPTION TO USE OINTMENT IN HER NOSE AND SHOWER WITH SPECIAL SOAP FOR  5 DAYS BEFORE HER SURGERY - I DID NOT GIVE PT CHLORHEXIDINE.  THERE IS NO MRSA HX IN EPIC - PCR NOT NEEDED.

## 2014-02-18 NOTE — Anesthesia Preprocedure Evaluation (Addendum)
Anesthesia Evaluation  Patient identified by MRN, date of birth, ID band Patient awake    Reviewed: Allergy & Precautions, H&P , NPO status , Patient's Chart, lab work & pertinent test results  Airway Mallampati: II TM Distance: >3 FB Neck ROM: Full    Dental no notable dental hx.    Pulmonary COPDformer smoker,  breath sounds clear to auscultation  Pulmonary exam normal       Cardiovascular negative cardio ROS  Rhythm:Regular Rate:Normal     Neuro/Psych Bipolar Disorder negative neurological ROS     GI/Hepatic negative GI ROS, Neg liver ROS,   Endo/Other  Hypothyroidism   Renal/GU negative Renal ROS  negative genitourinary   Musculoskeletal negative musculoskeletal ROS (+)   Abdominal   Peds negative pediatric ROS (+)  Hematology negative hematology ROS (+)   Anesthesia Other Findings   Reproductive/Obstetrics negative OB ROS                          Anesthesia Physical Anesthesia Plan  ASA: II  Anesthesia Plan: General   Post-op Pain Management:    Induction: Intravenous  Airway Management Planned: Oral ETT  Additional Equipment:   Intra-op Plan:   Post-operative Plan: Extubation in OR  Informed Consent: I have reviewed the patients History and Physical, chart, labs and discussed the procedure including the risks, benefits and alternatives for the proposed anesthesia with the patient or authorized representative who has indicated his/her understanding and acceptance.   Dental advisory given  Plan Discussed with: CRNA and Surgeon  Anesthesia Plan Comments:         Anesthesia Quick Evaluation

## 2014-02-19 ENCOUNTER — Ambulatory Visit (HOSPITAL_COMMUNITY): Payer: 59

## 2014-02-19 ENCOUNTER — Encounter (HOSPITAL_COMMUNITY): Payer: Self-pay

## 2014-02-19 ENCOUNTER — Encounter (HOSPITAL_COMMUNITY)
Admission: RE | Disposition: A | Discharge status: Home / Self Care | Payer: Self-pay | Source: Ambulatory Visit | Attending: Specialist

## 2014-02-19 ENCOUNTER — Ambulatory Visit (HOSPITAL_COMMUNITY): Payer: 59 | Admitting: Anesthesiology

## 2014-02-19 ENCOUNTER — Encounter (HOSPITAL_COMMUNITY): Payer: 59 | Admitting: Anesthesiology

## 2014-02-19 ENCOUNTER — Ambulatory Visit (HOSPITAL_COMMUNITY)
Admission: RE | Admit: 2014-02-19 | Discharge: 2014-02-19 | Disposition: A | Discharge status: Home / Self Care | Payer: 59 | Source: Ambulatory Visit | Attending: Specialist | Admitting: Specialist

## 2014-02-19 DIAGNOSIS — E039 Hypothyroidism, unspecified: Secondary | ICD-10-CM | POA: Diagnosis not present

## 2014-02-19 DIAGNOSIS — M67919 Unspecified disorder of synovium and tendon, unspecified shoulder: Secondary | ICD-10-CM | POA: Insufficient documentation

## 2014-02-19 DIAGNOSIS — F319 Bipolar disorder, unspecified: Secondary | ICD-10-CM | POA: Diagnosis not present

## 2014-02-19 DIAGNOSIS — M7500 Adhesive capsulitis of unspecified shoulder: Secondary | ICD-10-CM | POA: Insufficient documentation

## 2014-02-19 DIAGNOSIS — J4489 Other specified chronic obstructive pulmonary disease: Secondary | ICD-10-CM | POA: Insufficient documentation

## 2014-02-19 DIAGNOSIS — Z87891 Personal history of nicotine dependence: Secondary | ICD-10-CM | POA: Insufficient documentation

## 2014-02-19 DIAGNOSIS — M25819 Other specified joint disorders, unspecified shoulder: Secondary | ICD-10-CM | POA: Insufficient documentation

## 2014-02-19 DIAGNOSIS — J449 Chronic obstructive pulmonary disease, unspecified: Secondary | ICD-10-CM | POA: Insufficient documentation

## 2014-02-19 DIAGNOSIS — M7580 Other shoulder lesions, unspecified shoulder: Secondary | ICD-10-CM

## 2014-02-19 DIAGNOSIS — M75101 Unspecified rotator cuff tear or rupture of right shoulder, not specified as traumatic: Secondary | ICD-10-CM

## 2014-02-19 DIAGNOSIS — M719 Bursopathy, unspecified: Secondary | ICD-10-CM | POA: Insufficient documentation

## 2014-02-19 DIAGNOSIS — S43429A Sprain of unspecified rotator cuff capsule, initial encounter: Secondary | ICD-10-CM | POA: Diagnosis not present

## 2014-02-19 HISTORY — PX: SHOULDER ARTHROSCOPY WITH SUBACROMIAL DECOMPRESSION AND OPEN ROTATOR C: SHX5688

## 2014-02-19 SURGERY — SHOULDER ARTHROSCOPY WITH SUBACROMIAL DECOMPRESSION AND OPEN ROTATOR CUFF REPAIR, OPEN BICEPS TENDON REPAIR
Anesthesia: General | Site: Shoulder | Laterality: Right

## 2014-02-19 MED ORDER — CISATRACURIUM BESYLATE 20 MG/10ML IV SOLN
INTRAVENOUS | Status: AC
  Filled 2014-02-19: qty 10

## 2014-02-19 MED ORDER — DEXTROSE 5 % IV SOLN
500.0000 mg | Freq: Once | INTRAVENOUS | Status: AC
  Administered 2014-02-19: 500 mg via INTRAVENOUS
  Filled 2014-02-19: qty 5

## 2014-02-19 MED ORDER — FENTANYL CITRATE 0.05 MG/ML IJ SOLN
INTRAMUSCULAR | Status: DC | PRN
Start: 2014-02-19 — End: 2014-02-19
  Administered 2014-02-19 (×4): 50 ug via INTRAVENOUS

## 2014-02-19 MED ORDER — DEXAMETHASONE SODIUM PHOSPHATE 10 MG/ML IJ SOLN
INTRAMUSCULAR | Status: AC
  Filled 2014-02-19: qty 1

## 2014-02-19 MED ORDER — EPINEPHRINE HCL 1 MG/ML IJ SOLN
INTRAMUSCULAR | Status: DC | PRN
  Administered 2014-02-19: 2 mg

## 2014-02-19 MED ORDER — CEFAZOLIN SODIUM-DEXTROSE 2-3 GM-% IV SOLR
INTRAVENOUS | Status: AC
  Filled 2014-02-19: qty 50

## 2014-02-19 MED ORDER — METHOCARBAMOL 500 MG PO TABS: 500.0000 mg | ORAL_TABLET | Freq: Three times a day (TID) | ORAL | Status: DC | PRN

## 2014-02-19 MED ORDER — BUPIVACAINE-EPINEPHRINE (PF) 0.5% -1:200000 IJ SOLN
INTRAMUSCULAR | Status: AC
  Filled 2014-02-19: qty 30

## 2014-02-19 MED ORDER — KETOROLAC TROMETHAMINE 30 MG/ML IJ SOLN
INTRAMUSCULAR | Status: AC
  Filled 2014-02-19: qty 1

## 2014-02-19 MED ORDER — LACTATED RINGERS IR SOLN
Status: DC | PRN
  Administered 2014-02-19 (×2): 3000 mL

## 2014-02-19 MED ORDER — PROMETHAZINE HCL 25 MG/ML IJ SOLN: 6.2500 mg | INTRAMUSCULAR | Status: DC | PRN

## 2014-02-19 MED ORDER — MIDAZOLAM HCL 2 MG/2ML IJ SOLN
INTRAMUSCULAR | Status: AC
  Filled 2014-02-19: qty 2

## 2014-02-19 MED ORDER — OXYCODONE-ACETAMINOPHEN 10-325 MG PO TABS: 1.0000 | ORAL_TABLET | ORAL | Status: DC | PRN

## 2014-02-19 MED ORDER — HYDROMORPHONE HCL PF 1 MG/ML IJ SOLN
0.2500 mg | INTRAMUSCULAR | Status: DC | PRN
  Administered 2014-02-19 (×3): 0.5 mg via INTRAVENOUS

## 2014-02-19 MED ORDER — FENTANYL CITRATE 0.05 MG/ML IJ SOLN
INTRAMUSCULAR | Status: AC
  Filled 2014-02-19: qty 5

## 2014-02-19 MED ORDER — KETOROLAC TROMETHAMINE 10 MG PO TABS: 10.0000 mg | ORAL_TABLET | Freq: Four times a day (QID) | ORAL | Status: DC | PRN

## 2014-02-19 MED ORDER — DEXAMETHASONE SODIUM PHOSPHATE 10 MG/ML IJ SOLN
INTRAMUSCULAR | Status: DC | PRN
  Administered 2014-02-19: 10 mg via INTRAVENOUS

## 2014-02-19 MED ORDER — EPINEPHRINE HCL 1 MG/ML IJ SOLN
INTRAMUSCULAR | Status: AC
  Filled 2014-02-19: qty 2

## 2014-02-19 MED ORDER — HYDROMORPHONE HCL PF 1 MG/ML IJ SOLN
INTRAMUSCULAR | Status: AC
  Filled 2014-02-19: qty 1

## 2014-02-19 MED ORDER — LACTATED RINGERS IV SOLN
INTRAVENOUS | Status: DC
  Administered 2014-02-19: 07:00:00 via INTRAVENOUS

## 2014-02-19 MED ORDER — ONDANSETRON HCL 4 MG/2ML IJ SOLN
INTRAMUSCULAR | Status: DC | PRN
  Administered 2014-02-19: 4 mg via INTRAVENOUS

## 2014-02-19 MED ORDER — PROPOFOL 10 MG/ML IV BOLUS
INTRAVENOUS | Status: AC
  Filled 2014-02-19: qty 20

## 2014-02-19 MED ORDER — KETOROLAC TROMETHAMINE 30 MG/ML IJ SOLN
15.0000 mg | Freq: Once | INTRAMUSCULAR | Status: AC | PRN
  Administered 2014-02-19: 30 mg via INTRAVENOUS

## 2014-02-19 MED ORDER — MIDAZOLAM HCL 5 MG/5ML IJ SOLN
INTRAMUSCULAR | Status: DC | PRN
  Administered 2014-02-19 (×2): 1 mg via INTRAVENOUS

## 2014-02-19 MED ORDER — ONDANSETRON HCL 4 MG/2ML IJ SOLN
INTRAMUSCULAR | Status: AC
  Filled 2014-02-19: qty 2

## 2014-02-19 MED ORDER — PROPOFOL 10 MG/ML IV BOLUS
INTRAVENOUS | Status: DC | PRN
  Administered 2014-02-19: 150 ug via INTRAVENOUS

## 2014-02-19 MED ORDER — CEFAZOLIN SODIUM-DEXTROSE 2-3 GM-% IV SOLR
2.0000 g | INTRAVENOUS | Status: AC
  Administered 2014-02-19: 2 g via INTRAVENOUS

## 2014-02-19 MED ORDER — OXYCODONE-ACETAMINOPHEN 5-325 MG PO TABS
1.0000 | ORAL_TABLET | ORAL | Status: DC | PRN
  Administered 2014-02-19: 1 via ORAL
  Filled 2014-02-19: qty 1

## 2014-02-19 MED ORDER — SUCCINYLCHOLINE CHLORIDE 20 MG/ML IJ SOLN
INTRAMUSCULAR | Status: DC | PRN
  Administered 2014-02-19: 100 mg via INTRAVENOUS

## 2014-02-19 MED ORDER — BUPIVACAINE-EPINEPHRINE 0.5% -1:200000 IJ SOLN
INTRAMUSCULAR | Status: DC | PRN
  Administered 2014-02-19: 30 mL

## 2014-02-19 SURGICAL SUPPLY — 50 items
ANCHOR NDL 9/16 CIR SZ 8 (NEEDLE) IMPLANT
ANCHOR NEEDLE 9/16 CIR SZ 8 (NEEDLE) IMPLANT
BLADE CUDA SHAVER 3.5 (BLADE) ×2 IMPLANT
BLADE SURG SZ11 CARB STEEL (BLADE) ×2 IMPLANT
BUR OVAL 4.0 (BURR) ×1 IMPLANT
CANNULA ACUFO 5X76 (CANNULA) ×3 IMPLANT
CHLORAPREP W/TINT 26ML (MISCELLANEOUS) IMPLANT
CLOTH 2% CHLOROHEXIDINE 3PK (PERSONAL CARE ITEMS) ×2 IMPLANT
DECANTER SPIKE VIAL GLASS SM (MISCELLANEOUS) ×2 IMPLANT
DRAPE ORTHO SPLIT 77X108 STRL (DRAPES)
DRAPE POUCH INSTRU U-SHP 10X18 (DRAPES) ×2 IMPLANT
DRAPE STERI 35X30 U-POUCH (DRAPES) ×2 IMPLANT
DRAPE SURG ORHT 6 SPLT 77X108 (DRAPES) IMPLANT
DRSG AQUACEL AG ADV 3.5X 4 (GAUZE/BANDAGES/DRESSINGS) IMPLANT
DRSG AQUACEL AG ADV 3.5X 6 (GAUZE/BANDAGES/DRESSINGS) ×1 IMPLANT
DURAPREP 26ML APPLICATOR (WOUND CARE) ×2 IMPLANT
ELECT NDL TIP 2.8 STRL (NEEDLE) ×1 IMPLANT
ELECT NEEDLE TIP 2.8 STRL (NEEDLE) ×2 IMPLANT
GLOVE BIOGEL PI IND STRL 7.5 (GLOVE) ×1 IMPLANT
GLOVE BIOGEL PI INDICATOR 7.5 (GLOVE) ×1
GLOVE SURG SS PI 7.5 STRL IVOR (GLOVE) ×2 IMPLANT
GLOVE SURG SS PI 8.0 STRL IVOR (GLOVE) ×4 IMPLANT
GOWN STRL REUS W/TWL XL LVL3 (GOWN DISPOSABLE) ×4 IMPLANT
KIT BASIN OR (CUSTOM PROCEDURE TRAY) ×2 IMPLANT
KIT POSITION SHOULDER SCHLEI (MISCELLANEOUS) ×2 IMPLANT
MANIFOLD NEPTUNE II (INSTRUMENTS) ×3 IMPLANT
NDL SCORPION MULTI FIRE (NEEDLE) IMPLANT
NDL SPNL 18GX3.5 QUINCKE PK (NEEDLE) ×1 IMPLANT
NEEDLE SCORPION MULTI FIRE (NEEDLE) IMPLANT
NEEDLE SPNL 18GX3.5 QUINCKE PK (NEEDLE) ×2 IMPLANT
PACK SHOULDER CUSTOM OPM052 (CUSTOM PROCEDURE TRAY) ×2 IMPLANT
POSITIONER SURGICAL ARM (MISCELLANEOUS) ×2 IMPLANT
SET ARTHROSCOPY TUBING (MISCELLANEOUS) ×2
SET ARTHROSCOPY TUBING LN (MISCELLANEOUS) ×1 IMPLANT
SLING ARM IMMOBILIZER LRG (SOFTGOODS) IMPLANT
SLING ARM IMMOBILIZER MED (SOFTGOODS) ×2 IMPLANT
STRIP CLOSURE SKIN 1/2X4 (GAUZE/BANDAGES/DRESSINGS) IMPLANT
SUCTION FRAZIER TIP 10 FR DISP (SUCTIONS) ×2 IMPLANT
SUT ETHIBOND NAB CT1 #1 30IN (SUTURE) IMPLANT
SUT ETHILON 4 0 PS 2 18 (SUTURE) ×2 IMPLANT
SUT PROLENE 3 0 PS 2 (SUTURE) IMPLANT
SUT TIGER TAPE 7 IN WHITE (SUTURE) IMPLANT
SUT VIC AB 1-0 CT2 27 (SUTURE) IMPLANT
SUT VIC AB 2-0 CT2 27 (SUTURE) IMPLANT
SUT VICRYL 0-0 OS 2 NEEDLE (SUTURE) IMPLANT
TAPE FIBER 2MM 7IN #2 BLUE (SUTURE) IMPLANT
TOWEL OR 17X26 10 PK STRL BLUE (TOWEL DISPOSABLE) ×2 IMPLANT
TOWEL OR NON WOVEN STRL DISP B (DISPOSABLE) ×2 IMPLANT
TUBING CONNECTING 10 (TUBING) ×2 IMPLANT
WAND 90 DEG TURBOVAC W/CORD (SURGICAL WAND) ×1 IMPLANT

## 2014-02-19 NOTE — Transfer of Care (Signed)
Immediate Anesthesia Transfer of Care Note  Patient: [name]  Procedure(s) Performed: Procedure(s): RIGHT SHOULDER ARTHROSCOPY SUBACROMIAL DECOMPRESSION EVALUATION UNDER ANESTHESIA AND  MANIPULATION UNDER ANESTHIESIA DEBRIDEMENT OF PARTIAL ROTATOR CUFF (Right)  Patient Location: PACU  Anesthesia Type:General  Level of Consciousness: awake, alert  and patient cooperative  Airway & Oxygen Therapy: Patient Spontanous Breathing and Patient connected to face mask oxygen  Post-op Assessment: Report given to PACU RN and Post -op Vital signs reviewed and stable  Post vital signs: Reviewed and stable  Complications: No apparent anesthesia complications

## 2014-02-19 NOTE — H&P (View-Only) (Signed)
[name] DOB: 04/05/1956 Married / Language: English / Race: White Female  Chief Complaint: R shoulder pain  History of Present Illness The patient is a 58 year old female who presents today for follow up of their shoulder. The patient is being followed for their right shoulder pain. They are 8 week(s) out from a flare up. Symptoms reported today include: pain. and report their pain level to be 7 / 10. Current treatment includes: activity modification and pain medications. The following medication has been used for pain control: Hydrocodone (will be switching to Percocet tomorrow). The patient presents today following MRI.   [name] follows up today for her R shoulder to review MRI. Initially her symptoms started in October without injury. She was seen by Dr. Tonita Cong and had a steroid injection with relief. Her pain returned 2 months ago without change in activity or new injury with more severe pain which did not respond to her last steroid injection at the beginning of March. She is c/o ongoing severe pain, now constant, stiffness and weakness at the shoulder. Pain mgmt has switched her to Percocet 10mg  for pain which she will start tomorrow (Dr. Hardin Negus). She denies neck pain, numbness, tingling. She is having trouble sleeping due to pain. She has been doing PT with no improvement of her symptoms.  Past Medical Hx COPD Chronic Pain Fibromyalgia Migraines DJD  Past Surgical Hx R knee scope R oophorectomy B/L foot surgery Partial hysterectomy (non-cancerous) B/L breast reduction Neck disk surgery Spinal surgery Tubal ligation  Allergies Fiorinal *ANALGESICS - NonNarcotic* KlonoPIN *ANTICONVULSANTS* Neurontin *ANTICONVULSANTS*  Family History Heart Disease. mother, grandmother mothers side and grandmother fathers side Drug / Alcohol Addiction. mother, father, sister and grandmother fathers side Heart disease in female family member before age 18 Severe allergy.  mother Osteoporosis. mother Depression. sister Cancer. grandmother mothers side Rheumatoid Arthritis. First Degree Relatives. mother and sister Cerebrovascular Accident. mother Congestive Heart Failure. grandfather mothers side and grandmother fathers side Chronic Obstructive Lung Disease. mother, father and grandfather mothers side  Social History Tobacco use. Former smoker. former smoker; smoke(d) 1/2 pack(s) per day  Medication History Hydrocodone-Acetaminophen ( Oral) Specific dose unknown - Active. Topamax (100MG  Tablet, Oral) Active. Vitamin D (Ergocalciferol) (50000UNIT Capsule, Oral) Active. Dexilant (60MG  Capsule DR, Oral) Active. Synthroid (75MCG Tablet, Oral) Active. Latuda (60MG  Tablet, Oral) Active. ALPRAZolam (0.5MG  Tablet, Oral) Active. Prolia (60MG /ML Solution, Subcutaneous) Active. ProAir HFA (108 (90 Base)MCG/ACT Aerosol Soln, Inhalation) Active. Percocet (10-325MG  Tablet, Oral) Active. (will start 01/02/2014)  Review of Systems General:Not Present- Fever and Weight Loss. Skin:Not Present- Rash. Cardiovascular:Not Present- Shortness of Breath and Chest Pain. Musculoskeletal:Present- Joint Stiffness, Joint Swelling and Joint Pain. Neurological:Not Present- Numbness, Difficulty with balance, Weakness, Loss of bowel control and Loss of bladder control. Endocrine:Not Present- Excessive Thirst and Excessive Urination. Hematology:Not Present- Easy Bruising.  Physical Exam The physical exam findings are as follows:  General Mental Status - Alert. General Appearance- pleasant and In acute distress. Orientation- Oriented X3. Build & Nutrition- Well nourished and Well developed. Gait- Unassisted and unimpaired.  Musculoskeletal Upper Extremity Right Upper Extremity: Right Shoulder: Inspection and Palpation:Tenderness- deltoid tender to palpation and subacromial space tender to palpation. no tenderness to palpation of the [facility] joint, no  tenderness to palpation of the [place] joint and no tenderness to palpation of the clavicle. Swelling- none. Sensation- intact to light touch. Skin:Color- no ecchymosis and no erythema. ROM: Internal Rotation:AROM- severely decreased and painful. External Rotation:AROM- mildly decreased. Flexion:AROM- moderately decreased (to 90 degrees when isolated)  and painful. Glenohumeral Abduction:AROM- moderately decreased and painful. Testing limited- due to pain. Strength and Tone:Biceps- 5/5. Deltoid- normal. Triceps- 5/5. Abduction- 4+/5. Internal Rotation- 5/5. External Rotation- 4+/5. Rotator Cuff- normal. Instability- sulcus sign negative. Impingement- impingement sign positive and secondary impingement sign positive. Deformities/Malalignments/Discrepancies- no deformities noted. Special Testing- Speed's test negative.  Imaging xrays with AC arthrosis. Type 2 acromion, slight hook noted, with decreased subacromial space.  MRI R shoulder images and report with insterstitial tearing noted supraspinatus and infraspinatus tendons, which does appear more significant than stated on MRI report. Nondisplaced anterior labral tear. Moderate to severe AC DJD with undersurface spurring.  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 We discussed relevant anatomy. With her ongoing symptoms and MRI findings, recommend proceeding with surgery. At this point, recommend proceeding with R shoulder arthroscopy, SAD, EUA, MUA, and possible mini-open RCR. Discussed the procedure itself as well as risks, complications and alternatives, including but not limited to DVT, PE,  infx, bleeding, failure of procedure, need for secondary procedure, ongoing pain/symptoms, anesthesia risk, even stroke or death. Also discussed typical post-op protocols, time out of work, ice and elevation, PT phases, HEP, use of the sling. Discussed importance of activity modifications long term to avoid reinjury. Patient desires to proceed with surgery.  I had a long discussion with the patient concerning the risks and benefits of a rotator cuff repair, including bleeding, infection, prolonged postoperative recovery, which may require 3 to 5 months until maximum medical improvement. Overight procedure with initiation of early passive range of motion within physical therapy. Avoid any active motion for the first six weeks. This is all in an effort to avoid recurrent tear of the rotator cuff and adhesive capsulitis. Return to work without use of the arm can be obtained following two weeks. However, driving will be a challenge. We also discussed the possibility of requiring implants including bone anchors,as well as an Allograft patch graft if a massive rotator cuff tear is encountered. Removal of any bones for spurs as well as bursitis will be performed during the procedure and also any associated anesthetic complications as well.  She notes questionable hx of exposure to MRSA, her husband did have an infection in the hospital and she is not sure what it was, but he did have to use ointment in his nose. She was given MRSA decolonization Rx's. She is in pain mgmt and has Rx for Percocet. Discussed continued activity modifications. Will obtain pre-op clearance from her PCP Dr. [name]. Will put a hold on formal PT and resume post-op. We will proceed accordingly. She will follow up 10-14 days post-op for suture removal and will call with any questions or concerns in the interim.  Plan R Shoulder arthroscopy, SAD, EUA, MUA, possible mini-open RCR  Signed electronically by Lacie Draft PA-C for Dr.  Tonita Cong

## 2014-02-19 NOTE — Interval H&P Note (Signed)
History and Physical Interval Note:  02/19/2014 7:41 AM  [name]  has presented today for surgery, with the diagnosis of RIGHT SHOULDER IMPINGEMENT AND ADHESIVE CAPSULITIS AND ROTATOR CUFF TEAR  The various methods of treatment have been discussed with the patient and family. After consideration of risks, benefits and other options for treatment, the patient has consented to  Procedure(s): RIGHT SHOULDER ARTHROSCOPY SUBACROMIAL DECOMPRESSION EVALUATION UNDER ANESTHESIA AND  MANIPULATION UNDER ANESTHIESIA POSSIBLE MINI OPEN ROTATOR CUFF REPAIR  (Right) as a surgical intervention .  The patient's history has been reviewed, patient examined, no change in status, stable for surgery.  I have reviewed the patient's chart and labs.  Questions were answered to the patient's satisfaction.     Johnn Hai

## 2014-02-19 NOTE — Discharge Instructions (Signed)
SHOULDER ARTHROSCOPY POSTOPERATIVE INSTRUCTIONS FOR DR. Tonita Cong  1.  Ice pack on shoulder 3-4 times per day.  2.  May remove bandages after 7 days and apply band-aids to sutures.  3,  May get out of sling in AM and start gentle pendulum exercises.  You are encouraged to move the elbow, wrist and hand.  4.  Elevate operative shoulder and elbow on pillow.  5.  Exercise your fingers to help reduce swelling.  6.  Report to your doctor should any of the following situations occur:   -Swelling of your fingers.  -Inability to wiggle your fingers.  -Coldness, turning pale or blueness of your fingers.  -Loss of sensation, numbness or tingling of your fingers.  -Unusual small or odor from under dressing.  -Excessive bleeding or drainage from the surgical site(s).  -Severe pain which is not relieved by the pain medication your doctor prescribed                for you.  7.  Call the office to schedule and appointment for 10-14 days . 8.  Take one aspirin per day 325mg  with a meal if not on another blood thinner or allergic.  Patient Signature:  ________________________________________________________  Nurse's Signature:  ________________________________________________________

## 2014-02-19 NOTE — Progress Notes (Signed)
Husband has Albuterol inhaler in belongings bag w him

## 2014-02-19 NOTE — Anesthesia Postprocedure Evaluation (Signed)
  Anesthesia Post-op Note  Patient: [name]  Procedure(s) Performed: Procedure(s) (LRB): RIGHT SHOULDER ARTHROSCOPY SUBACROMIAL DECOMPRESSION EVALUATION UNDER ANESTHESIA AND  MANIPULATION UNDER ANESTHIESIA DEBRIDEMENT OF PARTIAL ROTATOR CUFF (Right)  Patient Location: PACU  Anesthesia Type: General  Level of Consciousness: awake and alert   Airway and Oxygen Therapy: Patient Spontanous Breathing  Post-op Pain: mild  Post-op Assessment: Post-op Vital signs reviewed, Patient's Cardiovascular Status Stable, Respiratory Function Stable, Patent Airway and No signs of Nausea or vomiting  Last Vitals:  Filed Vitals:   02/19/14 0915  BP: 118/63  Pulse: 79  Temp:   Resp: 11    Post-op Vital Signs: stable   Complications: No apparent anesthesia complications

## 2014-02-19 NOTE — Brief Op Note (Signed)
02/19/2014  8:45 AM  PATIENT:  Chauncey Mann  58 y.o. female  PRE-OPERATIVE DIAGNOSIS:  RIGHT SHOULDER IMPINGEMENT AND ADHESIVE CAPSULITIS AND ROTATOR CUFF TEAR  POST-OPERATIVE DIAGNOSIS:  RIGHT SHOULDER IMPINGEMENT AND ADHESIVE CAPSULITIS AND ROTATOR CUFF TEAR  PROCEDURE:  Procedure(s): RIGHT SHOULDER ARTHROSCOPY SUBACROMIAL DECOMPRESSION EVALUATION UNDER ANESTHESIA AND  MANIPULATION UNDER ANESTHIESIA DEBRIDEMENT OF PARTIAL ROTATOR CUFF (Right)  SURGEON:  Surgeon(s) and Role:    * Johnn Hai, MD - Primary  PHYSICIAN ASSISTANT:   ASSISTANTS: [place]   ANESTHESIA:   general  EBL:     BLOOD ADMINISTERED:none  DRAINS: none   LOCAL MEDICATIONS USED:  MARCAINE     SPECIMEN:  No Specimen  DISPOSITION OF SPECIMEN:  N/A  COUNTS:  YES  TOURNIQUET:  * No tourniquets in log *  DICTATION: .Other Dictation: Dictation Number [id number]  PLAN OF CARE: Discharge to home after PACU  PATIENT DISPOSITION:  PACU - hemodynamically stable.   Delay start of Pharmacological VTE agent (>24hrs) due to surgical blood loss or risk of bleeding: no

## 2014-02-20 ENCOUNTER — Encounter (HOSPITAL_COMMUNITY): Payer: Self-pay | Admitting: Specialist

## 2014-02-20 NOTE — Op Note (Signed)
NAMESIMMONE, CAPE               ACCOUNT NO.:  [id number]  MEDICAL RECORD NO.:  [id number]  LOCATION:  [facility]                         FACILITY:  [facility]  PHYSICIAN:  Susa Day, M.D.    DATE OF BIRTH:  22-May-1956  DATE OF PROCEDURE: DATE OF DISCHARGE:  02/19/2014                              OPERATIVE REPORT   PREOPERATIVE DIAGNOSIS:  Adhesive capsulitis, impingement syndrome of the right shoulder.  POSTOPERATIVE DIAGNOSIS:  Adhesive capsulitis, impingement syndrome of the right shoulder, partial tear of the rotator cuff.  PROCEDURE PERFORMED: 1. Exam followed by manipulation of the right shoulder. 2. Right shoulder arthroscopy with debridement of partial tear of the     rotator cuff. 3. Subacromial decompression with bursectomy and CA ligament excision.  ANESTHESIA:  General.  ASSISTANT:  Cleophas Dunker, PA was utilized for intermittent traction of the upper extremity, managing the influent and effluent of the arthroscopy closure.  HISTORY:  This is a 58 year old with adhesive capsulitis, impingement pain refractory to conservative treatment including rest, activity modification, corticosteroid injection.  MRI indicating rotator cuff arthropathy and bursitis, AC arthrosis, but she was asymptomatic. Indicated for manipulation followed by arthroscopic debridement.  Risks and benefits were discussed including bleeding, infection, damage to neurovascular structures, no change in her symptoms, worsening in her symptoms et Ronney Asters.  TECHNIQUE:  With the patient in supine in beach-chair position, after induction of adequate general anesthesia, 2 g Kefzol, we examined the right shoulder under anesthesia.  She lacked 20 degrees of abduction as well as forward flexion and 30 degrees of external rotation and internal rotation.  We used a gentle manipulative maneuver to the shoulder and increasing her abduction and forward flexion to full 120 with appreciation of lysis of  adhesions.  We then increase her internal and external rotation gasping the humerus proximally at 60 degrees at her side.  A 45 degrees of external rotation and internal rotation to L2. Then, we prepped and draped the shoulder in usual sterile fashion. Surgical marker was utilized to delineate the acromion, AC joint, and coracoid.  A standard posterolateral portal was utilized as was an anterolateral portal through the skin only with a #11 blade.  With the arm in 7-30 position, gentle traction applied.  We advanced the arthroscopic camera in the glenohumeral joint penetrating it atraumatically.  Irrigant was utilized to insufflate the joint 65 mmHg. A partial tearing at the undersurface of the rotator cuff was noted. The glenoid and humerus was unremarkable.  There was hemorrhagic debris. This was lavaged.  I fashioned an anterior portal with a #11 blade after localization with an 18-gauge needle just beneath the biceps tendon.  We introduced a shaver and debrided the partial tear of the rotator cuff under surface with a shaver.  This was very minimal.  The biceps was intact as was the subscap.  We lavaged the joint and then we redirected the camera in the subacromial space triangulating with an anterolateral portal.  Hypertrophic bursa was noted.  Bursectomy was performed.  We released the morselized CA ligament with an ArthroWand.  It was fairly flat acromion.  No tear in the rotator cuff.  It was probed and examined extensively.  No  need for rotator cuff repair.  There was no bursal side tear.  After full bursectomy, we lavaged the joint.  We removed the arthroscopic equipment, closed the portals with 4-0 nylon simple sutures.  A 0.25% Marcaine with epinephrine was infiltrated in the subacromial space and into the glenohumeral joint prior to the removal of the arthroscopic camera.  Sterile dressing was applied.  She was placed in a sling and extubated without difficulty, and transported  to the recovery room in satisfactory condition.  The patient tolerated the procedure well.  No complications.  Minimal blood loss.     Susa Day, M.D.     Geralynn Rile  D:  02/19/2014  T:  02/20/2014  Job:  [id number]

## 2014-02-25 DIAGNOSIS — M47817 Spondylosis without myelopathy or radiculopathy, lumbosacral region: Secondary | ICD-10-CM | POA: Diagnosis not present

## 2014-02-25 DIAGNOSIS — IMO0002 Reserved for concepts with insufficient information to code with codable children: Secondary | ICD-10-CM | POA: Diagnosis not present

## 2014-02-25 DIAGNOSIS — Z79899 Other long term (current) drug therapy: Secondary | ICD-10-CM | POA: Diagnosis not present

## 2014-03-13 DIAGNOSIS — Z9889 Other specified postprocedural states: Secondary | ICD-10-CM | POA: Diagnosis not present

## 2014-03-16 DIAGNOSIS — M25519 Pain in unspecified shoulder: Secondary | ICD-10-CM | POA: Diagnosis not present

## 2014-03-23 DIAGNOSIS — M25519 Pain in unspecified shoulder: Secondary | ICD-10-CM | POA: Diagnosis not present

## 2014-03-25 DIAGNOSIS — M25519 Pain in unspecified shoulder: Secondary | ICD-10-CM | POA: Diagnosis not present

## 2014-04-01 DIAGNOSIS — Z79899 Other long term (current) drug therapy: Secondary | ICD-10-CM | POA: Diagnosis not present

## 2014-04-01 DIAGNOSIS — M47817 Spondylosis without myelopathy or radiculopathy, lumbosacral region: Secondary | ICD-10-CM | POA: Diagnosis not present

## 2014-04-01 DIAGNOSIS — G894 Chronic pain syndrome: Secondary | ICD-10-CM | POA: Diagnosis not present

## 2014-04-01 DIAGNOSIS — M25519 Pain in unspecified shoulder: Secondary | ICD-10-CM | POA: Diagnosis not present

## 2014-04-20 DIAGNOSIS — F3189 Other bipolar disorder: Secondary | ICD-10-CM | POA: Diagnosis not present

## 2014-06-05 DIAGNOSIS — M7500 Adhesive capsulitis of unspecified shoulder: Secondary | ICD-10-CM | POA: Diagnosis not present

## 2014-06-05 DIAGNOSIS — M25819 Other specified joint disorders, unspecified shoulder: Secondary | ICD-10-CM | POA: Diagnosis not present

## 2014-06-05 DIAGNOSIS — M25519 Pain in unspecified shoulder: Secondary | ICD-10-CM | POA: Diagnosis not present

## 2014-06-11 DIAGNOSIS — Z23 Encounter for immunization: Secondary | ICD-10-CM | POA: Diagnosis not present

## 2014-06-17 DIAGNOSIS — G5720 Lesion of femoral nerve, unspecified lower limb: Secondary | ICD-10-CM | POA: Diagnosis not present

## 2014-07-01 DIAGNOSIS — G894 Chronic pain syndrome: Secondary | ICD-10-CM | POA: Diagnosis not present

## 2014-07-01 DIAGNOSIS — M79604 Pain in right leg: Secondary | ICD-10-CM | POA: Diagnosis not present

## 2014-07-01 DIAGNOSIS — M4726 Other spondylosis with radiculopathy, lumbar region: Secondary | ICD-10-CM | POA: Diagnosis not present

## 2014-07-01 DIAGNOSIS — M79605 Pain in left leg: Secondary | ICD-10-CM | POA: Diagnosis not present

## 2014-07-09 ENCOUNTER — Other Ambulatory Visit: Payer: Self-pay | Admitting: Anesthesiology

## 2014-07-09 DIAGNOSIS — M545 Low back pain: Secondary | ICD-10-CM

## 2014-07-09 DIAGNOSIS — M79604 Pain in right leg: Secondary | ICD-10-CM

## 2014-07-09 DIAGNOSIS — M79605 Pain in left leg: Secondary | ICD-10-CM

## 2014-07-14 ENCOUNTER — Ambulatory Visit
Admission: RE | Admit: 2014-07-14 | Discharge: 2014-07-14 | Disposition: A | Discharge status: Home / Self Care | Payer: 59 | Source: Ambulatory Visit | Attending: Anesthesiology | Admitting: Anesthesiology

## 2014-07-14 DIAGNOSIS — M545 Low back pain: Secondary | ICD-10-CM

## 2014-07-14 DIAGNOSIS — M79605 Pain in left leg: Secondary | ICD-10-CM

## 2014-07-14 DIAGNOSIS — M79604 Pain in right leg: Secondary | ICD-10-CM

## 2014-07-14 MED ORDER — GADOBENATE DIMEGLUMINE 529 MG/ML IV SOLN
10.0000 mL | Freq: Once | INTRAVENOUS | Status: AC | PRN
  Administered 2014-07-14: 10 mL via INTRAVENOUS

## 2014-07-31 DIAGNOSIS — M79605 Pain in left leg: Secondary | ICD-10-CM | POA: Diagnosis not present

## 2014-07-31 DIAGNOSIS — M79604 Pain in right leg: Secondary | ICD-10-CM | POA: Diagnosis not present

## 2014-07-31 DIAGNOSIS — M47816 Spondylosis without myelopathy or radiculopathy, lumbar region: Secondary | ICD-10-CM | POA: Diagnosis not present

## 2014-07-31 DIAGNOSIS — G894 Chronic pain syndrome: Secondary | ICD-10-CM | POA: Diagnosis not present

## 2014-08-06 DIAGNOSIS — F3181 Bipolar II disorder: Secondary | ICD-10-CM | POA: Diagnosis not present

## 2014-09-04 ENCOUNTER — Other Ambulatory Visit: Payer: Self-pay | Admitting: Family Medicine

## 2014-09-04 DIAGNOSIS — H9311 Tinnitus, right ear: Secondary | ICD-10-CM

## 2014-09-14 ENCOUNTER — Ambulatory Visit
Admission: RE | Admit: 2014-09-14 | Discharge: 2014-09-14 | Disposition: A | Discharge status: Home / Self Care | Payer: 59 | Source: Ambulatory Visit | Attending: Family Medicine | Admitting: Family Medicine

## 2014-09-14 DIAGNOSIS — H9311 Tinnitus, right ear: Secondary | ICD-10-CM

## 2014-09-14 MED ORDER — GADOBENATE DIMEGLUMINE 529 MG/ML IV SOLN
13.0000 mL | Freq: Once | INTRAVENOUS | Status: AC | PRN
  Administered 2014-09-14: 13 mL via INTRAVENOUS

## 2014-09-22 DIAGNOSIS — M79605 Pain in left leg: Secondary | ICD-10-CM | POA: Diagnosis not present

## 2014-09-22 DIAGNOSIS — M47816 Spondylosis without myelopathy or radiculopathy, lumbar region: Secondary | ICD-10-CM | POA: Diagnosis not present

## 2014-09-22 DIAGNOSIS — M79604 Pain in right leg: Secondary | ICD-10-CM | POA: Diagnosis not present

## 2014-09-22 DIAGNOSIS — G894 Chronic pain syndrome: Secondary | ICD-10-CM | POA: Diagnosis not present

## 2014-09-24 DIAGNOSIS — H9311 Tinnitus, right ear: Secondary | ICD-10-CM | POA: Diagnosis not present

## 2014-10-01 DIAGNOSIS — F3181 Bipolar II disorder: Secondary | ICD-10-CM | POA: Diagnosis not present

## 2014-10-05 DIAGNOSIS — G44209 Tension-type headache, unspecified, not intractable: Secondary | ICD-10-CM | POA: Diagnosis not present

## 2014-10-20 DIAGNOSIS — Z01419 Encounter for gynecological examination (general) (routine) without abnormal findings: Secondary | ICD-10-CM | POA: Diagnosis not present

## 2014-10-20 DIAGNOSIS — N941 Dyspareunia: Secondary | ICD-10-CM | POA: Diagnosis not present

## 2014-11-07 DIAGNOSIS — J441 Chronic obstructive pulmonary disease with (acute) exacerbation: Secondary | ICD-10-CM | POA: Diagnosis not present

## 2014-12-08 DIAGNOSIS — F3181 Bipolar II disorder: Secondary | ICD-10-CM | POA: Diagnosis not present

## 2015-01-05 DIAGNOSIS — M62830 Muscle spasm of back: Secondary | ICD-10-CM | POA: Diagnosis not present

## 2015-01-25 DIAGNOSIS — M47816 Spondylosis without myelopathy or radiculopathy, lumbar region: Secondary | ICD-10-CM | POA: Diagnosis not present

## 2015-01-25 DIAGNOSIS — G894 Chronic pain syndrome: Secondary | ICD-10-CM | POA: Diagnosis not present

## 2015-01-25 DIAGNOSIS — M79604 Pain in right leg: Secondary | ICD-10-CM | POA: Diagnosis not present

## 2015-01-25 DIAGNOSIS — M79605 Pain in left leg: Secondary | ICD-10-CM | POA: Diagnosis not present

## 2015-01-26 DIAGNOSIS — F3181 Bipolar II disorder: Secondary | ICD-10-CM | POA: Diagnosis not present

## 2015-02-12 DIAGNOSIS — F3181 Bipolar II disorder: Secondary | ICD-10-CM | POA: Diagnosis not present

## 2015-02-24 DIAGNOSIS — H6593 Unspecified nonsuppurative otitis media, bilateral: Secondary | ICD-10-CM | POA: Diagnosis not present

## 2015-04-12 DIAGNOSIS — E538 Deficiency of other specified B group vitamins: Secondary | ICD-10-CM | POA: Diagnosis not present

## 2015-04-12 DIAGNOSIS — J449 Chronic obstructive pulmonary disease, unspecified: Secondary | ICD-10-CM | POA: Diagnosis not present

## 2015-04-12 DIAGNOSIS — E039 Hypothyroidism, unspecified: Secondary | ICD-10-CM | POA: Diagnosis not present

## 2015-04-12 DIAGNOSIS — Z1211 Encounter for screening for malignant neoplasm of colon: Secondary | ICD-10-CM | POA: Diagnosis not present

## 2015-04-12 DIAGNOSIS — F3170 Bipolar disorder, currently in remission, most recent episode unspecified: Secondary | ICD-10-CM | POA: Diagnosis not present

## 2015-04-12 DIAGNOSIS — R079 Chest pain, unspecified: Secondary | ICD-10-CM | POA: Diagnosis not present

## 2015-04-12 DIAGNOSIS — N952 Postmenopausal atrophic vaginitis: Secondary | ICD-10-CM | POA: Diagnosis not present

## 2015-04-26 DIAGNOSIS — F3181 Bipolar II disorder: Secondary | ICD-10-CM | POA: Diagnosis not present

## 2015-05-04 ENCOUNTER — Ambulatory Visit
Admission: RE | Admit: 2015-05-04 | Discharge: 2015-05-04 | Disposition: A | Discharge status: Home / Self Care | Payer: Medicare Other | Source: Ambulatory Visit | Attending: Cardiology | Admitting: Cardiology

## 2015-05-04 ENCOUNTER — Other Ambulatory Visit: Payer: Self-pay | Admitting: Cardiology

## 2015-05-04 DIAGNOSIS — R0789 Other chest pain: Secondary | ICD-10-CM

## 2015-05-14 DIAGNOSIS — J449 Chronic obstructive pulmonary disease, unspecified: Secondary | ICD-10-CM | POA: Diagnosis not present

## 2015-05-14 DIAGNOSIS — R0789 Other chest pain: Secondary | ICD-10-CM | POA: Diagnosis not present

## 2015-05-14 DIAGNOSIS — R9431 Abnormal electrocardiogram [ECG] [EKG]: Secondary | ICD-10-CM | POA: Diagnosis not present

## 2015-05-14 DIAGNOSIS — E039 Hypothyroidism, unspecified: Secondary | ICD-10-CM | POA: Diagnosis not present

## 2015-05-14 DIAGNOSIS — K219 Gastro-esophageal reflux disease without esophagitis: Secondary | ICD-10-CM | POA: Diagnosis not present

## 2015-06-15 DIAGNOSIS — L723 Sebaceous cyst: Secondary | ICD-10-CM | POA: Diagnosis not present

## 2015-07-02 DIAGNOSIS — G894 Chronic pain syndrome: Secondary | ICD-10-CM | POA: Diagnosis not present

## 2015-07-02 DIAGNOSIS — M461 Sacroiliitis, not elsewhere classified: Secondary | ICD-10-CM | POA: Diagnosis not present

## 2015-07-02 DIAGNOSIS — G609 Hereditary and idiopathic neuropathy, unspecified: Secondary | ICD-10-CM | POA: Diagnosis not present

## 2015-07-02 DIAGNOSIS — M25512 Pain in left shoulder: Secondary | ICD-10-CM | POA: Diagnosis not present

## 2015-07-20 DIAGNOSIS — L720 Epidermal cyst: Secondary | ICD-10-CM | POA: Diagnosis not present

## 2015-07-23 DIAGNOSIS — M67912 Unspecified disorder of synovium and tendon, left shoulder: Secondary | ICD-10-CM | POA: Diagnosis not present

## 2015-07-26 DIAGNOSIS — G609 Hereditary and idiopathic neuropathy, unspecified: Secondary | ICD-10-CM | POA: Diagnosis not present

## 2015-07-26 DIAGNOSIS — G894 Chronic pain syndrome: Secondary | ICD-10-CM | POA: Diagnosis not present

## 2015-07-26 DIAGNOSIS — M461 Sacroiliitis, not elsewhere classified: Secondary | ICD-10-CM | POA: Diagnosis not present

## 2015-07-26 DIAGNOSIS — M25512 Pain in left shoulder: Secondary | ICD-10-CM | POA: Diagnosis not present

## 2015-08-23 DIAGNOSIS — F3181 Bipolar II disorder: Secondary | ICD-10-CM | POA: Diagnosis not present

## 2015-08-31 DIAGNOSIS — F3181 Bipolar II disorder: Secondary | ICD-10-CM | POA: Diagnosis not present

## 2015-09-22 DIAGNOSIS — M67912 Unspecified disorder of synovium and tendon, left shoulder: Secondary | ICD-10-CM | POA: Diagnosis not present

## 2015-09-27 DIAGNOSIS — J069 Acute upper respiratory infection, unspecified: Secondary | ICD-10-CM | POA: Diagnosis not present

## 2015-09-27 DIAGNOSIS — R6889 Other general symptoms and signs: Secondary | ICD-10-CM | POA: Diagnosis not present

## 2015-10-13 DIAGNOSIS — R5383 Other fatigue: Secondary | ICD-10-CM | POA: Diagnosis not present

## 2015-10-13 DIAGNOSIS — E039 Hypothyroidism, unspecified: Secondary | ICD-10-CM | POA: Diagnosis not present

## 2015-10-13 DIAGNOSIS — M810 Age-related osteoporosis without current pathological fracture: Secondary | ICD-10-CM | POA: Diagnosis not present

## 2015-10-13 DIAGNOSIS — K219 Gastro-esophageal reflux disease without esophagitis: Secondary | ICD-10-CM | POA: Diagnosis not present

## 2015-10-13 DIAGNOSIS — E559 Vitamin D deficiency, unspecified: Secondary | ICD-10-CM | POA: Diagnosis not present

## 2015-10-13 DIAGNOSIS — J449 Chronic obstructive pulmonary disease, unspecified: Secondary | ICD-10-CM | POA: Diagnosis not present

## 2015-10-18 DIAGNOSIS — G609 Hereditary and idiopathic neuropathy, unspecified: Secondary | ICD-10-CM | POA: Diagnosis not present

## 2015-10-18 DIAGNOSIS — M47816 Spondylosis without myelopathy or radiculopathy, lumbar region: Secondary | ICD-10-CM | POA: Diagnosis not present

## 2015-10-18 DIAGNOSIS — G894 Chronic pain syndrome: Secondary | ICD-10-CM | POA: Diagnosis not present

## 2015-10-18 DIAGNOSIS — F3181 Bipolar II disorder: Secondary | ICD-10-CM | POA: Diagnosis not present

## 2015-10-18 DIAGNOSIS — M461 Sacroiliitis, not elsewhere classified: Secondary | ICD-10-CM | POA: Diagnosis not present

## 2015-10-21 DIAGNOSIS — M25512 Pain in left shoulder: Secondary | ICD-10-CM | POA: Diagnosis not present

## 2015-10-26 DIAGNOSIS — M25512 Pain in left shoulder: Secondary | ICD-10-CM | POA: Diagnosis not present

## 2015-10-28 DIAGNOSIS — M25512 Pain in left shoulder: Secondary | ICD-10-CM | POA: Diagnosis not present

## 2015-11-29 DIAGNOSIS — M545 Low back pain: Secondary | ICD-10-CM | POA: Diagnosis not present

## 2015-12-20 DIAGNOSIS — M533 Sacrococcygeal disorders, not elsewhere classified: Secondary | ICD-10-CM | POA: Diagnosis not present

## 2015-12-22 ENCOUNTER — Other Ambulatory Visit: Payer: Self-pay | Admitting: Orthopedic Surgery

## 2015-12-22 DIAGNOSIS — M532X8 Spinal instabilities, sacral and sacrococcygeal region: Secondary | ICD-10-CM

## 2015-12-28 ENCOUNTER — Other Ambulatory Visit: Payer: Medicare Other

## 2015-12-31 ENCOUNTER — Ambulatory Visit
Admission: RE | Admit: 2015-12-31 | Discharge: 2015-12-31 | Disposition: A | Discharge status: Home / Self Care | Payer: Commercial Managed Care - HMO | Source: Ambulatory Visit | Attending: Orthopedic Surgery | Admitting: Orthopedic Surgery

## 2015-12-31 DIAGNOSIS — M532X8 Spinal instabilities, sacral and sacrococcygeal region: Secondary | ICD-10-CM

## 2016-01-03 DIAGNOSIS — M4696 Unspecified inflammatory spondylopathy, lumbar region: Secondary | ICD-10-CM | POA: Diagnosis not present

## 2016-01-10 DIAGNOSIS — F3181 Bipolar II disorder: Secondary | ICD-10-CM | POA: Diagnosis not present

## 2016-02-23 DIAGNOSIS — S80862A Insect bite (nonvenomous), left lower leg, initial encounter: Secondary | ICD-10-CM | POA: Diagnosis not present

## 2016-02-23 DIAGNOSIS — L814 Other melanin hyperpigmentation: Secondary | ICD-10-CM | POA: Diagnosis not present

## 2016-02-23 DIAGNOSIS — D239 Other benign neoplasm of skin, unspecified: Secondary | ICD-10-CM | POA: Diagnosis not present

## 2016-05-01 DIAGNOSIS — F3181 Bipolar II disorder: Secondary | ICD-10-CM | POA: Diagnosis not present

## 2016-05-08 DIAGNOSIS — M150 Primary generalized (osteo)arthritis: Secondary | ICD-10-CM | POA: Diagnosis not present

## 2016-05-08 DIAGNOSIS — G629 Polyneuropathy, unspecified: Secondary | ICD-10-CM | POA: Diagnosis not present

## 2016-05-08 DIAGNOSIS — G894 Chronic pain syndrome: Secondary | ICD-10-CM | POA: Diagnosis not present

## 2016-06-12 DIAGNOSIS — F3181 Bipolar II disorder: Secondary | ICD-10-CM | POA: Diagnosis not present

## 2016-06-15 DIAGNOSIS — J449 Chronic obstructive pulmonary disease, unspecified: Secondary | ICD-10-CM | POA: Diagnosis not present

## 2016-06-15 DIAGNOSIS — E039 Hypothyroidism, unspecified: Secondary | ICD-10-CM | POA: Diagnosis not present

## 2016-06-15 DIAGNOSIS — Z1231 Encounter for screening mammogram for malignant neoplasm of breast: Secondary | ICD-10-CM | POA: Diagnosis not present

## 2016-06-15 DIAGNOSIS — F17200 Nicotine dependence, unspecified, uncomplicated: Secondary | ICD-10-CM | POA: Diagnosis not present

## 2016-06-15 DIAGNOSIS — E7800 Pure hypercholesterolemia, unspecified: Secondary | ICD-10-CM | POA: Diagnosis not present

## 2016-06-15 DIAGNOSIS — J301 Allergic rhinitis due to pollen: Secondary | ICD-10-CM | POA: Diagnosis not present

## 2016-06-15 DIAGNOSIS — Z23 Encounter for immunization: Secondary | ICD-10-CM | POA: Diagnosis not present

## 2016-07-14 ENCOUNTER — Other Ambulatory Visit: Payer: Self-pay | Admitting: Family Medicine

## 2016-07-14 DIAGNOSIS — Z1231 Encounter for screening mammogram for malignant neoplasm of breast: Secondary | ICD-10-CM

## 2016-08-07 DIAGNOSIS — E441 Mild protein-calorie malnutrition: Secondary | ICD-10-CM | POA: Diagnosis not present

## 2016-08-07 DIAGNOSIS — J0100 Acute maxillary sinusitis, unspecified: Secondary | ICD-10-CM | POA: Diagnosis not present

## 2016-08-08 ENCOUNTER — Ambulatory Visit
Admission: RE | Admit: 2016-08-08 | Discharge: 2016-08-08 | Disposition: A | Discharge status: Home / Self Care | Payer: Commercial Managed Care - HMO | Source: Ambulatory Visit | Attending: Family Medicine | Admitting: Family Medicine

## 2016-08-08 DIAGNOSIS — Z1231 Encounter for screening mammogram for malignant neoplasm of breast: Secondary | ICD-10-CM

## 2016-10-02 DIAGNOSIS — F3181 Bipolar II disorder: Secondary | ICD-10-CM | POA: Diagnosis not present

## 2016-11-08 DIAGNOSIS — M150 Primary generalized (osteo)arthritis: Secondary | ICD-10-CM | POA: Diagnosis not present

## 2016-11-08 DIAGNOSIS — G894 Chronic pain syndrome: Secondary | ICD-10-CM | POA: Diagnosis not present

## 2016-11-08 DIAGNOSIS — G629 Polyneuropathy, unspecified: Secondary | ICD-10-CM | POA: Diagnosis not present

## 2016-12-01 DIAGNOSIS — M542 Cervicalgia: Secondary | ICD-10-CM | POA: Diagnosis not present

## 2016-12-01 DIAGNOSIS — M67912 Unspecified disorder of synovium and tendon, left shoulder: Secondary | ICD-10-CM | POA: Diagnosis not present

## 2016-12-13 DIAGNOSIS — M67912 Unspecified disorder of synovium and tendon, left shoulder: Secondary | ICD-10-CM | POA: Diagnosis not present

## 2017-01-17 DIAGNOSIS — M24812 Other specific joint derangements of left shoulder, not elsewhere classified: Secondary | ICD-10-CM | POA: Diagnosis not present

## 2017-01-22 DIAGNOSIS — F3181 Bipolar II disorder: Secondary | ICD-10-CM | POA: Diagnosis not present

## 2017-02-06 DIAGNOSIS — M7542 Impingement syndrome of left shoulder: Secondary | ICD-10-CM | POA: Diagnosis not present

## 2017-02-06 DIAGNOSIS — M24112 Other articular cartilage disorders, left shoulder: Secondary | ICD-10-CM | POA: Diagnosis not present

## 2017-02-06 DIAGNOSIS — G8918 Other acute postprocedural pain: Secondary | ICD-10-CM | POA: Diagnosis not present

## 2017-02-06 DIAGNOSIS — M19012 Primary osteoarthritis, left shoulder: Secondary | ICD-10-CM | POA: Diagnosis not present

## 2017-02-06 DIAGNOSIS — M75112 Incomplete rotator cuff tear or rupture of left shoulder, not specified as traumatic: Secondary | ICD-10-CM | POA: Diagnosis not present

## 2017-02-19 DIAGNOSIS — M25612 Stiffness of left shoulder, not elsewhere classified: Secondary | ICD-10-CM | POA: Diagnosis not present

## 2017-02-19 DIAGNOSIS — M25512 Pain in left shoulder: Secondary | ICD-10-CM | POA: Diagnosis not present

## 2017-02-23 DIAGNOSIS — M25612 Stiffness of left shoulder, not elsewhere classified: Secondary | ICD-10-CM | POA: Diagnosis not present

## 2017-02-23 DIAGNOSIS — M25512 Pain in left shoulder: Secondary | ICD-10-CM | POA: Diagnosis not present

## 2017-02-27 DIAGNOSIS — M25612 Stiffness of left shoulder, not elsewhere classified: Secondary | ICD-10-CM | POA: Diagnosis not present

## 2017-02-27 DIAGNOSIS — M25512 Pain in left shoulder: Secondary | ICD-10-CM | POA: Diagnosis not present

## 2017-03-01 DIAGNOSIS — M25512 Pain in left shoulder: Secondary | ICD-10-CM | POA: Diagnosis not present

## 2017-03-01 DIAGNOSIS — M25612 Stiffness of left shoulder, not elsewhere classified: Secondary | ICD-10-CM | POA: Diagnosis not present

## 2017-03-06 DIAGNOSIS — M25612 Stiffness of left shoulder, not elsewhere classified: Secondary | ICD-10-CM | POA: Diagnosis not present

## 2017-03-06 DIAGNOSIS — M25512 Pain in left shoulder: Secondary | ICD-10-CM | POA: Diagnosis not present

## 2017-03-07 DIAGNOSIS — M25612 Stiffness of left shoulder, not elsewhere classified: Secondary | ICD-10-CM | POA: Diagnosis not present

## 2017-03-07 DIAGNOSIS — M25512 Pain in left shoulder: Secondary | ICD-10-CM | POA: Diagnosis not present

## 2017-03-12 DIAGNOSIS — M25612 Stiffness of left shoulder, not elsewhere classified: Secondary | ICD-10-CM | POA: Diagnosis not present

## 2017-03-12 DIAGNOSIS — M25512 Pain in left shoulder: Secondary | ICD-10-CM | POA: Diagnosis not present

## 2017-03-15 DIAGNOSIS — M25512 Pain in left shoulder: Secondary | ICD-10-CM | POA: Diagnosis not present

## 2017-03-15 DIAGNOSIS — M25612 Stiffness of left shoulder, not elsewhere classified: Secondary | ICD-10-CM | POA: Diagnosis not present

## 2017-03-22 DIAGNOSIS — M25612 Stiffness of left shoulder, not elsewhere classified: Secondary | ICD-10-CM | POA: Diagnosis not present

## 2017-03-22 DIAGNOSIS — M25512 Pain in left shoulder: Secondary | ICD-10-CM | POA: Diagnosis not present

## 2017-03-26 DIAGNOSIS — M25612 Stiffness of left shoulder, not elsewhere classified: Secondary | ICD-10-CM | POA: Diagnosis not present

## 2017-03-26 DIAGNOSIS — M25512 Pain in left shoulder: Secondary | ICD-10-CM | POA: Diagnosis not present

## 2017-03-28 DIAGNOSIS — M810 Age-related osteoporosis without current pathological fracture: Secondary | ICD-10-CM | POA: Diagnosis not present

## 2017-03-28 DIAGNOSIS — K219 Gastro-esophageal reflux disease without esophagitis: Secondary | ICD-10-CM | POA: Diagnosis not present

## 2017-03-28 DIAGNOSIS — E538 Deficiency of other specified B group vitamins: Secondary | ICD-10-CM | POA: Diagnosis not present

## 2017-03-28 DIAGNOSIS — J449 Chronic obstructive pulmonary disease, unspecified: Secondary | ICD-10-CM | POA: Diagnosis not present

## 2017-03-28 DIAGNOSIS — E440 Moderate protein-calorie malnutrition: Secondary | ICD-10-CM | POA: Diagnosis not present

## 2017-03-28 DIAGNOSIS — E039 Hypothyroidism, unspecified: Secondary | ICD-10-CM | POA: Diagnosis not present

## 2017-03-28 DIAGNOSIS — E559 Vitamin D deficiency, unspecified: Secondary | ICD-10-CM | POA: Diagnosis not present

## 2017-03-28 DIAGNOSIS — Z0000 Encounter for general adult medical examination without abnormal findings: Secondary | ICD-10-CM | POA: Diagnosis not present

## 2017-03-28 DIAGNOSIS — F17200 Nicotine dependence, unspecified, uncomplicated: Secondary | ICD-10-CM | POA: Diagnosis not present

## 2017-03-28 DIAGNOSIS — E785 Hyperlipidemia, unspecified: Secondary | ICD-10-CM | POA: Diagnosis not present

## 2017-03-30 ENCOUNTER — Ambulatory Visit
Admission: RE | Admit: 2017-03-30 | Discharge: 2017-03-30 | Disposition: A | Discharge status: Home / Self Care | Payer: Commercial Managed Care - HMO | Source: Ambulatory Visit | Attending: Family Medicine | Admitting: Family Medicine

## 2017-03-30 ENCOUNTER — Other Ambulatory Visit: Payer: Self-pay | Admitting: Family Medicine

## 2017-03-30 DIAGNOSIS — J42 Unspecified chronic bronchitis: Secondary | ICD-10-CM

## 2017-04-03 DIAGNOSIS — M25612 Stiffness of left shoulder, not elsewhere classified: Secondary | ICD-10-CM | POA: Diagnosis not present

## 2017-04-03 DIAGNOSIS — M25512 Pain in left shoulder: Secondary | ICD-10-CM | POA: Diagnosis not present

## 2017-04-06 DIAGNOSIS — M25512 Pain in left shoulder: Secondary | ICD-10-CM | POA: Diagnosis not present

## 2017-04-06 DIAGNOSIS — M25612 Stiffness of left shoulder, not elsewhere classified: Secondary | ICD-10-CM | POA: Diagnosis not present

## 2017-04-09 DIAGNOSIS — M25612 Stiffness of left shoulder, not elsewhere classified: Secondary | ICD-10-CM | POA: Diagnosis not present

## 2017-04-09 DIAGNOSIS — M25512 Pain in left shoulder: Secondary | ICD-10-CM | POA: Diagnosis not present

## 2017-04-11 DIAGNOSIS — M25512 Pain in left shoulder: Secondary | ICD-10-CM | POA: Diagnosis not present

## 2017-04-11 DIAGNOSIS — M25612 Stiffness of left shoulder, not elsewhere classified: Secondary | ICD-10-CM | POA: Diagnosis not present

## 2017-04-17 DIAGNOSIS — M25612 Stiffness of left shoulder, not elsewhere classified: Secondary | ICD-10-CM | POA: Diagnosis not present

## 2017-04-17 DIAGNOSIS — M25512 Pain in left shoulder: Secondary | ICD-10-CM | POA: Diagnosis not present

## 2017-04-24 DIAGNOSIS — M25612 Stiffness of left shoulder, not elsewhere classified: Secondary | ICD-10-CM | POA: Diagnosis not present

## 2017-04-24 DIAGNOSIS — M25512 Pain in left shoulder: Secondary | ICD-10-CM | POA: Diagnosis not present

## 2017-05-15 DIAGNOSIS — F3181 Bipolar II disorder: Secondary | ICD-10-CM | POA: Diagnosis not present

## 2017-06-20 DIAGNOSIS — Z23 Encounter for immunization: Secondary | ICD-10-CM | POA: Diagnosis not present

## 2017-08-07 DIAGNOSIS — F3181 Bipolar II disorder: Secondary | ICD-10-CM | POA: Diagnosis not present

## 2017-09-27 DIAGNOSIS — E440 Moderate protein-calorie malnutrition: Secondary | ICD-10-CM | POA: Diagnosis not present

## 2017-09-27 DIAGNOSIS — E785 Hyperlipidemia, unspecified: Secondary | ICD-10-CM | POA: Diagnosis not present

## 2017-09-27 DIAGNOSIS — R69 Illness, unspecified: Secondary | ICD-10-CM | POA: Diagnosis not present

## 2017-09-27 DIAGNOSIS — J069 Acute upper respiratory infection, unspecified: Secondary | ICD-10-CM | POA: Diagnosis not present

## 2017-09-27 DIAGNOSIS — J449 Chronic obstructive pulmonary disease, unspecified: Secondary | ICD-10-CM | POA: Diagnosis not present

## 2017-11-20 DIAGNOSIS — E039 Hypothyroidism, unspecified: Secondary | ICD-10-CM | POA: Diagnosis not present

## 2017-11-20 DIAGNOSIS — J309 Allergic rhinitis, unspecified: Secondary | ICD-10-CM | POA: Diagnosis not present

## 2017-11-20 DIAGNOSIS — E785 Hyperlipidemia, unspecified: Secondary | ICD-10-CM | POA: Diagnosis not present

## 2017-11-20 DIAGNOSIS — R69 Illness, unspecified: Secondary | ICD-10-CM | POA: Diagnosis not present

## 2017-11-20 DIAGNOSIS — E559 Vitamin D deficiency, unspecified: Secondary | ICD-10-CM | POA: Diagnosis not present

## 2017-11-20 DIAGNOSIS — H547 Unspecified visual loss: Secondary | ICD-10-CM | POA: Diagnosis not present

## 2017-11-20 DIAGNOSIS — K08409 Partial loss of teeth, unspecified cause, unspecified class: Secondary | ICD-10-CM | POA: Diagnosis not present

## 2017-11-20 DIAGNOSIS — F419 Anxiety disorder, unspecified: Secondary | ICD-10-CM | POA: Diagnosis not present

## 2017-11-20 DIAGNOSIS — G629 Polyneuropathy, unspecified: Secondary | ICD-10-CM | POA: Diagnosis not present

## 2017-12-03 DIAGNOSIS — M810 Age-related osteoporosis without current pathological fracture: Secondary | ICD-10-CM | POA: Diagnosis not present

## 2017-12-26 DIAGNOSIS — J449 Chronic obstructive pulmonary disease, unspecified: Secondary | ICD-10-CM | POA: Diagnosis not present

## 2017-12-26 DIAGNOSIS — E785 Hyperlipidemia, unspecified: Secondary | ICD-10-CM | POA: Diagnosis not present

## 2017-12-26 DIAGNOSIS — G44209 Tension-type headache, unspecified, not intractable: Secondary | ICD-10-CM | POA: Diagnosis not present

## 2017-12-26 DIAGNOSIS — J301 Allergic rhinitis due to pollen: Secondary | ICD-10-CM | POA: Diagnosis not present

## 2017-12-26 DIAGNOSIS — R69 Illness, unspecified: Secondary | ICD-10-CM | POA: Diagnosis not present

## 2017-12-26 DIAGNOSIS — E039 Hypothyroidism, unspecified: Secondary | ICD-10-CM | POA: Diagnosis not present

## 2017-12-26 DIAGNOSIS — E441 Mild protein-calorie malnutrition: Secondary | ICD-10-CM | POA: Diagnosis not present

## 2017-12-26 DIAGNOSIS — Z79899 Other long term (current) drug therapy: Secondary | ICD-10-CM | POA: Diagnosis not present

## 2018-01-08 DIAGNOSIS — J449 Chronic obstructive pulmonary disease, unspecified: Secondary | ICD-10-CM | POA: Diagnosis not present

## 2018-01-08 DIAGNOSIS — H9202 Otalgia, left ear: Secondary | ICD-10-CM | POA: Diagnosis not present

## 2018-01-08 DIAGNOSIS — H6982 Other specified disorders of Eustachian tube, left ear: Secondary | ICD-10-CM | POA: Diagnosis not present

## 2018-01-28 DIAGNOSIS — M542 Cervicalgia: Secondary | ICD-10-CM | POA: Diagnosis not present

## 2018-01-28 DIAGNOSIS — M4692 Unspecified inflammatory spondylopathy, cervical region: Secondary | ICD-10-CM | POA: Diagnosis not present

## 2018-01-29 DIAGNOSIS — R69 Illness, unspecified: Secondary | ICD-10-CM | POA: Diagnosis not present

## 2018-02-01 DIAGNOSIS — M542 Cervicalgia: Secondary | ICD-10-CM | POA: Diagnosis not present

## 2018-02-27 DIAGNOSIS — M47812 Spondylosis without myelopathy or radiculopathy, cervical region: Secondary | ICD-10-CM | POA: Diagnosis not present

## 2018-03-27 DIAGNOSIS — J449 Chronic obstructive pulmonary disease, unspecified: Secondary | ICD-10-CM | POA: Diagnosis not present

## 2018-03-27 DIAGNOSIS — R69 Illness, unspecified: Secondary | ICD-10-CM | POA: Diagnosis not present

## 2018-03-27 DIAGNOSIS — E441 Mild protein-calorie malnutrition: Secondary | ICD-10-CM | POA: Diagnosis not present

## 2018-03-27 DIAGNOSIS — M79642 Pain in left hand: Secondary | ICD-10-CM | POA: Diagnosis not present

## 2018-05-06 DIAGNOSIS — R079 Chest pain, unspecified: Secondary | ICD-10-CM | POA: Diagnosis not present

## 2018-05-06 DIAGNOSIS — I350 Nonrheumatic aortic (valve) stenosis: Secondary | ICD-10-CM | POA: Diagnosis not present

## 2018-05-06 DIAGNOSIS — E039 Hypothyroidism, unspecified: Secondary | ICD-10-CM | POA: Diagnosis not present

## 2018-05-06 DIAGNOSIS — R9431 Abnormal electrocardiogram [ECG] [EKG]: Secondary | ICD-10-CM | POA: Diagnosis not present

## 2018-05-06 DIAGNOSIS — I359 Nonrheumatic aortic valve disorder, unspecified: Secondary | ICD-10-CM | POA: Diagnosis not present

## 2018-05-06 DIAGNOSIS — K219 Gastro-esophageal reflux disease without esophagitis: Secondary | ICD-10-CM | POA: Diagnosis not present

## 2018-05-06 DIAGNOSIS — J449 Chronic obstructive pulmonary disease, unspecified: Secondary | ICD-10-CM | POA: Diagnosis not present

## 2018-05-29 DIAGNOSIS — R69 Illness, unspecified: Secondary | ICD-10-CM | POA: Diagnosis not present

## 2018-06-06 DIAGNOSIS — M810 Age-related osteoporosis without current pathological fracture: Secondary | ICD-10-CM | POA: Diagnosis not present

## 2018-06-11 DIAGNOSIS — F3181 Bipolar II disorder: Secondary | ICD-10-CM | POA: Insufficient documentation

## 2018-06-27 ENCOUNTER — Encounter: Payer: Self-pay | Admitting: Psychiatry

## 2018-06-27 ENCOUNTER — Ambulatory Visit: Payer: Medicare HMO | Admitting: Psychiatry

## 2018-06-27 DIAGNOSIS — F4310 Post-traumatic stress disorder, unspecified: Secondary | ICD-10-CM | POA: Diagnosis not present

## 2018-06-27 DIAGNOSIS — F3181 Bipolar II disorder: Secondary | ICD-10-CM

## 2018-06-27 DIAGNOSIS — R69 Illness, unspecified: Secondary | ICD-10-CM | POA: Diagnosis not present

## 2018-06-27 NOTE — Progress Notes (Signed)
[facility]  Patient ID: [name],  MRN: [id number]  PCP: [name], MD  Date of Evaluation: 06/27/2018 Time spent:30 minutes  HISTORY/CURRENT STATUS: HPI   Overall ok.  1 episode drinking too much and got sick.  H still angry about it. Ashamed of it.  Does not drink excessively.   Since he's retired I feel like I'm in prison.  H inactive and just watches TV  Has to have xanax for anxiety. It works and tolerated.  Individual Medical History/ Review of Systems: Changes? :Yes back pain and shoulder.  Allergies: Alendronate sodium; Fiorinal [butalbital-aspirin-caffeine]; Iohexol; Klonopin [clonazepam]; Other; Tramadol; and Dexilant [dexlansoprazole]  Current Medications:  Current Outpatient Medications:  .  albuterol (PROVENTIL HFA;VENTOLIN HFA) 108 (90 BASE) MCG/ACT inhaler, Inhale 1 puff into the lungs every 6 (six) hours as needed for wheezing or shortness of breath., Disp: , Rfl:  .  ALPRAZolam (XANAX) 0.5 MG tablet, Take 0.5 mg by mouth 4 (four) times daily. , Disp: , Rfl:  .  citalopram (CELEXA) 20 MG tablet, Take 20 mg by mouth daily., Disp: , Rfl:  .  levothyroxine (SYNTHROID, LEVOTHROID) 75 MCG tablet, Take 75 mcg by mouth daily before breakfast., Disp: , Rfl:  .  Lurasidone HCl 120 MG TABS, Take 120 mg by mouth daily., Disp: , Rfl:  .  topiramate (TOPAMAX) 100 MG tablet, Take 150 mg by mouth daily. , Disp: , Rfl:  .  Vitamin D, Ergocalciferol, (DRISDOL) 50000 UNITS CAPS capsule, Take 50,000 Units by mouth every 7 (seven) days., Disp: , Rfl:  .  Acetylcysteine (N-ACETYL-L-CYSTEINE) 600 MG CAPS, Take 600 mg by mouth daily., Disp: , Rfl:  .  denosumab (PROLIA) 60 MG/ML SOLN injection, Inject 60 mg into the skin every 6 (six) months. Administer in upper arm, thigh, or abdomen, Disp: , Rfl:  .  dexlansoprazole (DEXILANT) 60 MG capsule, Take 60 mg by mouth daily., Disp: , Rfl:  .  ketorolac (TORADOL) 10 MG tablet, Take 1 tablet (10 mg total) by mouth every  6 (six) hours as needed. (Patient not taking: Reported on 06/27/2018), Disp: 20 tablet, Rfl: 0 .  methocarbamol (ROBAXIN) 500 MG tablet, Take 1 tablet (500 mg total) by mouth 3 (three) times daily between meals as needed for muscle spasms. (Patient not taking: Reported on 06/27/2018), Disp: 40 tablet, Rfl: 1 .  oxyCODONE-acetaminophen (PERCOCET) 10-325 MG per tablet, Take 1 tablet by mouth every 6 (six) hours as needed for pain., Disp: , Rfl:  .  oxyCODONE-acetaminophen (PERCOCET) 10-325 MG per tablet, Take 1 tablet by mouth every 4 (four) hours as needed for pain. (Patient not taking: Reported on 06/27/2018), Disp: 30 tablet, Rfl: 0 Medication Side Effects: None  Family Medical/ Social History: Changes? Yes H retired stressful  MENTAL HEALTH EXAM:  There were no vitals taken for this visit.There is no height or weight on file to calculate BMI.  General Appearance: Casual  Eye Contact:  Good  Speech:  Clear and Coherent  Volume:  Normal  Mood:  Dysphoric; anxiety  Affect:  Restricted  Thought Process:  Coherent  Orientation:  Full (Time, Place, and Person)  Thought Content: WDL ; occ intrusive thoughts  Suicidal Thoughts:  No  Homicidal Thoughts:  No  Memory:  Recent  Judgement:  Good  Insight:  Good  Psychomotor Activity:  Normal  Concentration:  Concentration: Good  Recall:  Good  Fund of Knowledge: Good  Language: Good  Akathisia:  No  AIMS (if indicated): not done  Assets:  Chief of Staff  ADL's:  Intact  Cognition: WNL  Prognosis:  Fair    DIAGNOSES:    ICD-10-CM   1. Bipolar II disorder ([place]) F31.81   2. PTSD (post-traumatic stress disorder) F43.10     RECOMMENDATIONS:  She has a long history of mood swings and anxiety.  Episodic chronic family problems also contribute to these difficulties.  Psychotherapy focused on the following areas.  Forgive herself.  She is not a substance abuser. Enc activity.  Disc  conflict with husband and her need for an outlet.  Disc "crazy thoughts" that are intrusive.  Used CBT techniques for 20 minutes  Benefit from meds.  Could increase citalopram for obs thoughts but this could trigger mood cycling.  Disc LT risk BZ.  She feels they're necessary. Follow-up earlier as needed or otherwise by 6 months as she is living some distance from the office and now.  Purnell Shoemaker, MD

## 2018-07-30 DIAGNOSIS — E559 Vitamin D deficiency, unspecified: Secondary | ICD-10-CM | POA: Diagnosis not present

## 2018-07-30 DIAGNOSIS — E785 Hyperlipidemia, unspecified: Secondary | ICD-10-CM | POA: Diagnosis not present

## 2018-07-30 DIAGNOSIS — J449 Chronic obstructive pulmonary disease, unspecified: Secondary | ICD-10-CM | POA: Diagnosis not present

## 2018-07-30 DIAGNOSIS — E441 Mild protein-calorie malnutrition: Secondary | ICD-10-CM | POA: Diagnosis not present

## 2018-07-30 DIAGNOSIS — E039 Hypothyroidism, unspecified: Secondary | ICD-10-CM | POA: Diagnosis not present

## 2018-07-30 DIAGNOSIS — M5440 Lumbago with sciatica, unspecified side: Secondary | ICD-10-CM | POA: Diagnosis not present

## 2018-07-30 DIAGNOSIS — R69 Illness, unspecified: Secondary | ICD-10-CM | POA: Diagnosis not present

## 2018-10-07 DIAGNOSIS — Z0100 Encounter for examination of eyes and vision without abnormal findings: Secondary | ICD-10-CM | POA: Diagnosis not present

## 2018-10-25 ENCOUNTER — Other Ambulatory Visit: Payer: Self-pay

## 2018-10-25 MED ORDER — CITALOPRAM HYDROBROMIDE 20 MG PO TABS: 20.0000 mg | ORAL_TABLET | Freq: Every day | ORAL | 0 refills | Status: DC

## 2018-11-13 DIAGNOSIS — Z7951 Long term (current) use of inhaled steroids: Secondary | ICD-10-CM | POA: Diagnosis not present

## 2018-11-13 DIAGNOSIS — J449 Chronic obstructive pulmonary disease, unspecified: Secondary | ICD-10-CM | POA: Diagnosis not present

## 2018-11-13 DIAGNOSIS — J309 Allergic rhinitis, unspecified: Secondary | ICD-10-CM | POA: Diagnosis not present

## 2018-11-13 DIAGNOSIS — E039 Hypothyroidism, unspecified: Secondary | ICD-10-CM | POA: Diagnosis not present

## 2018-11-13 DIAGNOSIS — Z780 Asymptomatic menopausal state: Secondary | ICD-10-CM | POA: Diagnosis not present

## 2018-11-13 DIAGNOSIS — K219 Gastro-esophageal reflux disease without esophagitis: Secondary | ICD-10-CM | POA: Diagnosis not present

## 2018-11-13 DIAGNOSIS — R69 Illness, unspecified: Secondary | ICD-10-CM | POA: Diagnosis not present

## 2018-11-13 DIAGNOSIS — M810 Age-related osteoporosis without current pathological fracture: Secondary | ICD-10-CM | POA: Diagnosis not present

## 2018-11-14 ENCOUNTER — Other Ambulatory Visit: Payer: Self-pay

## 2018-11-14 MED ORDER — ALPRAZOLAM 0.5 MG PO TABS: 0.5000 mg | ORAL_TABLET | Freq: Four times a day (QID) | ORAL | 0 refills | Status: DC

## 2018-11-14 NOTE — Progress Notes (Signed)
Refill request from [facility] [address] for Alprazolam 0.5mg  1 tablet qid #120 Next office visit 12/26/2018 Last fill #120 on 08/29/2018

## 2018-11-29 DIAGNOSIS — E441 Mild protein-calorie malnutrition: Secondary | ICD-10-CM | POA: Diagnosis not present

## 2018-11-29 DIAGNOSIS — R69 Illness, unspecified: Secondary | ICD-10-CM | POA: Diagnosis not present

## 2018-11-29 DIAGNOSIS — J449 Chronic obstructive pulmonary disease, unspecified: Secondary | ICD-10-CM | POA: Diagnosis not present

## 2018-11-29 DIAGNOSIS — J301 Allergic rhinitis due to pollen: Secondary | ICD-10-CM | POA: Diagnosis not present

## 2018-11-29 DIAGNOSIS — E039 Hypothyroidism, unspecified: Secondary | ICD-10-CM | POA: Diagnosis not present

## 2018-12-06 DIAGNOSIS — M810 Age-related osteoporosis without current pathological fracture: Secondary | ICD-10-CM | POA: Diagnosis not present

## 2018-12-17 DIAGNOSIS — J301 Allergic rhinitis due to pollen: Secondary | ICD-10-CM | POA: Diagnosis not present

## 2018-12-17 DIAGNOSIS — R69 Illness, unspecified: Secondary | ICD-10-CM | POA: Diagnosis not present

## 2018-12-17 DIAGNOSIS — J449 Chronic obstructive pulmonary disease, unspecified: Secondary | ICD-10-CM | POA: Diagnosis not present

## 2018-12-24 ENCOUNTER — Other Ambulatory Visit: Payer: Self-pay

## 2018-12-24 MED ORDER — TOPIRAMATE 100 MG PO TABS: 150.0000 mg | ORAL_TABLET | Freq: Every day | ORAL | 0 refills | Status: DC

## 2018-12-26 ENCOUNTER — Ambulatory Visit: Payer: Medicare HMO | Admitting: Psychiatry

## 2019-01-16 ENCOUNTER — Other Ambulatory Visit: Payer: Self-pay | Admitting: Psychiatry

## 2019-01-17 ENCOUNTER — Other Ambulatory Visit: Payer: Self-pay | Admitting: Psychiatry

## 2019-01-19 NOTE — Telephone Encounter (Signed)
appt 05/08 last fill 02/27

## 2019-01-24 ENCOUNTER — Ambulatory Visit: Payer: Medicare HMO | Admitting: Psychiatry

## 2019-02-12 DIAGNOSIS — E039 Hypothyroidism, unspecified: Secondary | ICD-10-CM | POA: Diagnosis not present

## 2019-02-13 DIAGNOSIS — E039 Hypothyroidism, unspecified: Secondary | ICD-10-CM | POA: Diagnosis not present

## 2019-02-13 DIAGNOSIS — R69 Illness, unspecified: Secondary | ICD-10-CM | POA: Diagnosis not present

## 2019-02-13 DIAGNOSIS — J449 Chronic obstructive pulmonary disease, unspecified: Secondary | ICD-10-CM | POA: Diagnosis not present

## 2019-02-13 DIAGNOSIS — M810 Age-related osteoporosis without current pathological fracture: Secondary | ICD-10-CM | POA: Diagnosis not present

## 2019-02-13 DIAGNOSIS — E785 Hyperlipidemia, unspecified: Secondary | ICD-10-CM | POA: Diagnosis not present

## 2019-03-12 ENCOUNTER — Encounter (INDEPENDENT_AMBULATORY_CARE_PROVIDER_SITE_OTHER): Payer: Self-pay

## 2019-03-12 ENCOUNTER — Encounter: Payer: Self-pay | Admitting: Psychiatry

## 2019-03-12 ENCOUNTER — Ambulatory Visit: Payer: Medicare HMO | Admitting: Psychiatry

## 2019-03-12 ENCOUNTER — Other Ambulatory Visit: Payer: Self-pay

## 2019-03-12 DIAGNOSIS — F4310 Post-traumatic stress disorder, unspecified: Secondary | ICD-10-CM | POA: Diagnosis not present

## 2019-03-12 DIAGNOSIS — F3181 Bipolar II disorder: Secondary | ICD-10-CM

## 2019-03-12 DIAGNOSIS — F4001 Agoraphobia with panic disorder: Secondary | ICD-10-CM | POA: Diagnosis not present

## 2019-03-12 DIAGNOSIS — R69 Illness, unspecified: Secondary | ICD-10-CM | POA: Diagnosis not present

## 2019-03-12 MED ORDER — ALPRAZOLAM 0.5 MG PO TABS: 0.5000 mg | ORAL_TABLET | Freq: Four times a day (QID) | ORAL | 5 refills | Status: DC

## 2019-03-12 NOTE — Progress Notes (Signed)
[name] [id number] 05-30-1956 63 y.o.  Subjective:   Patient ID:  [name] is a 63 y.o. (DOB [birth year]) female.  Chief Complaint:  Chief Complaint  Patient presents with  . Follow-up    depression and anxiety    HPI [name] presents to the office today for follow-up of bipolar 2, PTSD.  Last seen October 2019.  No meds were changed  Friend with Sepsis died.  Misses her.  Hard bc friends for 50+ years.  Caused her some anxiety too. Son still acting crazy.  GS facing court for felony larceny and hasn't seen him in 4 years.  Don't know what to do with them.  Patient reports stable mood and denies depressed or irritable moods.  Patient has some recent difficulty with anxiety.  Patient denies difficulty with sleep initiation or maintenance. Denies appetite disturbance.  Patient reports that energy and motivation have been good.  Patient denies any difficulty with concentration.  Patient denies any suicidal ideation.  In a prayer chain and prays daily to help herself and others.  Asks to reduce citalopram to 20.  Past Psychiatric Medication Trials: Latuda 120, citalopram 30, N-acetylcysteine, Topamax 150, Latuda 120, Xanax, sertraline more anxiety, Saphris 20, mirtazapine 30, clonidine, Seroquel, Depakote, duloxetine, Wellbutrin, paroxetine, fluoxetine, Symbyax, Ambien, gabapentin no response for pain, Lyrica no response, trazodone no response, hydroxyzine headache, lamotrigine rash, nortriptyline, Flexeril, Abilify, lithium weight gain, diazepam Under our care since March 2007  Review of Systems:  Review of Systems  Respiratory: Positive for shortness of breath.   Neurological: Negative for tremors and weakness.    Medications: I have reviewed the patient's current medications.  Current Outpatient Medications  Medication Sig Dispense Refill  . albuterol (PROVENTIL HFA;VENTOLIN HFA) 108 (90 BASE) MCG/ACT inhaler Inhale 1 puff into the lungs every 6 (six) hours as  needed for wheezing or shortness of breath.    . ALPRAZolam (XANAX) 0.5 MG tablet TAKE 1 TABLET BY MOUTH FOUR TIMES A DAY 120 tablet 0  . citalopram (CELEXA) 20 MG tablet TAKE 1 TABLET BY MOUTH EVERY DAY (Patient taking differently: 30 mg. ) 90 tablet 0  . denosumab (PROLIA) 60 MG/ML SOLN injection Inject 60 mg into the skin every 6 (six) months. Administer in upper arm, thigh, or abdomen    . ketorolac (TORADOL) 10 MG tablet Take 1 tablet (10 mg total) by mouth every 6 (six) hours as needed. 20 tablet 0  . levothyroxine (SYNTHROID, LEVOTHROID) 75 MCG tablet Take 88 mcg by mouth daily before breakfast.     . Lurasidone HCl 120 MG TABS Take 120 mg by mouth daily.    Marland Kitchen omeprazole (PRILOSEC) 40 MG capsule Take 40 mg by mouth daily.    Marland Kitchen topiramate (TOPAMAX) 100 MG tablet Take 1.5 tablets (150 mg total) by mouth daily. 135 tablet 0  . Vitamin D, Ergocalciferol, (DRISDOL) 50000 UNITS CAPS capsule Take 50,000 Units by mouth every 7 (seven) days.    . Acetylcysteine (N-ACETYL-L-CYSTEINE) 600 MG CAPS Take 600 mg by mouth daily.    Marland Kitchen dexlansoprazole (DEXILANT) 60 MG capsule Take 60 mg by mouth daily.    . methocarbamol (ROBAXIN) 500 MG tablet Take 1 tablet (500 mg total) by mouth 3 (three) times daily between meals as needed for muscle spasms. (Patient not taking: Reported on 06/27/2018) 40 tablet 1  . oxyCODONE-acetaminophen (PERCOCET) 10-325 MG per tablet Take 1 tablet by mouth every 6 (six) hours as needed for pain.    Marland Kitchen oxyCODONE-acetaminophen (PERCOCET)  10-325 MG per tablet Take 1 tablet by mouth every 4 (four) hours as needed for pain. (Patient not taking: Reported on 06/27/2018) 30 tablet 0   No current facility-administered medications for this visit.     Medication Side Effects: None  Allergies:  Allergies  Allergen Reactions  . Alendronate Sodium Shortness Of Breath  . Fiorinal [Butalbital-Aspirin-Caffeine]     SWELLING OF LIPS AND HIVES  . Iohexol      Code: HIVES, Desc: omnipaque  300-doc'd by KB on 04-13-06.Marland KitchenMarland Kitchenpt states she gets severe hives   . Klonopin [Clonazepam]     PT STATES SHE OVERDOSED ON KLONOPIN  . Other     THE PURPLE DYE IN GENERIC SYNTHROID ( LEVOTHYROXINE ) CAUSED BREAKING OUT FACE AND SCALP.  PT CAN TAKE SYNTHROID.  Marland Kitchen Tramadol Other (See Comments)    depression  . Dexilant [Dexlansoprazole] Diarrhea    Past Medical History:  Diagnosis Date  . Bipolar disorder (HCC)    PT ON TOPIRAMATE AND ZANAX- SEES PSYCHIATRIST DR. COTTLE - PT STATES DOING WELL ON MEDICATIONS  . COPD (chronic obstructive pulmonary disease) ([place])    OCCAS USE OF INHALER-QUIT SMOKING 2014 - USING E- CIGARETTE  . Diverticulitis   . GERD (gastroesophageal reflux disease)   . H/O suicide attempt 2007   PT'S SON HAD COMMITTED SUICIDE AND SHE WAS OVER COME WITH GRIEF  . Hypothyroidism   . IBS (irritable bowel syndrome)   . Lichen simplex chronicus   . MVP (mitral valve prolapse)    DOES NOT CAUSE ANY PROBLEMS  . Osteoporosis   . Pain    RIGHT SHOULDER AND LIMITED ROM - -RT SHOULDER IMPINGEMENT, ADHESIVE CAPSULITIS, ROTATOR CUFF TEAR  . Pain    NECK PAIN -PT HOPING SURGERY ON HER SHOULDER HELPS HER NECK PAIN- DOES HAVE HX OF CERBVICAL DISK FUSION.  . Pain    LUMBAR PAIN - DDD AND PREVIOUS LUMBAR SURGERY  . Vitamin D deficiency     History reviewed. No pertinent family history.  Social History   Socioeconomic History  . Marital status: Married    Spouse name: Not on file  . Number of children: Not on file  . Years of education: Not on file  . Highest education level: Not on file  Occupational History  . Not on file  Social Needs  . Financial resource strain: Not on file  . Food insecurity    Worry: Not on file    Inability: Not on file  . Transportation needs    Medical: Not on file    Non-medical: Not on file  Tobacco Use  . Smoking status: Former Smoker    Packs/day: 1.00    Years: 40.00    Pack years: 40.00    Types: Cigarettes    Quit date: 10/06/2012     Years since quitting: 6.4  . Smokeless tobacco: Current User  Substance and Sexual Activity  . Alcohol use: No    Comment: SMOKING E- CIGARETTES  . Drug use: No  . Sexual activity: Not on file  Lifestyle  . Physical activity    Days per week: Not on file    Minutes per session: Not on file  . Stress: Not on file  Relationships  . Social Herbalist on phone: Not on file    Gets together: Not on file    Attends religious service: Not on file    Active member of club or organization: Not on file    Attends  meetings of clubs or organizations: Not on file    Relationship status: Not on file  . Intimate partner violence    Fear of current or ex partner: Not on file    Emotionally abused: Not on file    Physically abused: Not on file    Forced sexual activity: Not on file  Other Topics Concern  . Not on file  Social History Narrative  . Not on file    Past Medical History, Surgical history, Social history, and Family history were reviewed and updated as appropriate.   Please see review of systems for further details on the patient's review from today.   Objective:   Physical Exam:  There were no vitals taken for this visit.  Physical Exam Constitutional:      General: She is not in acute distress.    Appearance: She is well-developed.  Musculoskeletal:        General: No deformity.  Neurological:     Mental Status: She is alert and oriented to person, place, and time.     Coordination: Coordination normal.  Psychiatric:        Attention and Perception: Attention and perception normal. She does not perceive auditory or visual hallucinations.        Mood and Affect: Mood is anxious. Mood is not depressed. Affect is not labile, blunt, angry or inappropriate.        Speech: Speech normal.        Behavior: Behavior normal.        Thought Content: Thought content normal. Thought content does not include homicidal or suicidal ideation. Thought content does not  include homicidal or suicidal plan.        Cognition and Memory: Cognition and memory normal.        Judgment: Judgment normal.     Comments: Insight intact. No delusions.      Lab Review:     Component Value Date/Time   NA 141 02/11/2014 1045   K 4.5 02/11/2014 1045   CL 105 02/11/2014 1045   CO2 24 02/11/2014 1045   GLUCOSE 97 02/11/2014 1045   BUN 9 02/11/2014 1045   CREATININE 0.71 02/11/2014 1045   CALCIUM 9.6 02/11/2014 1045   PROT 7.2 01/28/2011 1626   ALBUMIN 4.1 01/28/2011 1626   AST 13 01/28/2011 1626   ALT 10 01/28/2011 1626   ALKPHOS 93 01/28/2011 1626   BILITOT 0.8 01/28/2011 1626   GFRNONAA >90 02/11/2014 1045   GFRAA >90 02/11/2014 1045       Component Value Date/Time   WBC 5.4 02/11/2014 1045   RBC 4.73 02/11/2014 1045   HGB 14.1 02/11/2014 1045   HCT 41.8 02/11/2014 1045   PLT 249 02/11/2014 1045   MCV 88.4 02/11/2014 1045   MCH 29.8 02/11/2014 1045   MCHC 33.7 02/11/2014 1045   RDW 13.2 02/11/2014 1045   LYMPHSABS 3.4 01/28/2011 1626   MONOABS 1.2 (H) 01/28/2011 1626   EOSABS 0.1 01/28/2011 1626   BASOSABS 0.0 01/28/2011 1626    No results found for: POCLITH, LITHIUM   No results found for: PHENYTOIN, PHENOBARB, VALPROATE, CBMZ   .res Assessment: Plan:    Thomasa was seen today for follow-up.  Diagnoses and all orders for this visit:  PTSD (post-traumatic stress disorder)  Panic disorder with agoraphobia  Bipolar II disorder ([place])   Supportive therapy dealing with Covid and son with addiction and her codependence.    As can been seen above the patient has had  treatment resistant bipolar disorder plus PTSD and panic with multiple failed medications with pretty good response to Latuda 120, citalopram 30, Xanax 0.5 4 times daily.  We discussed the short-term risks associated with benzodiazepines including sedation and increased fall risk among others.  Discussed long-term side effect risk including dependence, potential withdrawal  symptoms, and the potential eventual dose-related risk of dementia. Would have panic without Xanax.  Discussed potential metabolic side effects associated with atypical antipsychotics, as well as potential risk for movement side effects. Advised pt to contact office if movement side effects occur.  No evidence for TD.  No med changes indicated. However she wants to reduce citalopram to 20 mg daily.  That seems reasonable.  This may actually reduce the risk of mood cycling because SSRIs can induce mood cycling and bipolar patients.  Discussed the risks and let us know if she has any worsening depression or anxiety.  FU 6 mos  Lynder Parents, MD, DFAPA   Please see After Visit Summary for patient specific instructions.  No future appointments.  No orders of the defined types were placed in this encounter.   -------------------------------

## 2019-03-16 ENCOUNTER — Other Ambulatory Visit: Payer: Self-pay | Admitting: Psychiatry

## 2019-03-20 DIAGNOSIS — J449 Chronic obstructive pulmonary disease, unspecified: Secondary | ICD-10-CM | POA: Diagnosis not present

## 2019-03-20 DIAGNOSIS — M810 Age-related osteoporosis without current pathological fracture: Secondary | ICD-10-CM | POA: Diagnosis not present

## 2019-03-20 DIAGNOSIS — E785 Hyperlipidemia, unspecified: Secondary | ICD-10-CM | POA: Diagnosis not present

## 2019-03-20 DIAGNOSIS — E039 Hypothyroidism, unspecified: Secondary | ICD-10-CM | POA: Diagnosis not present

## 2019-03-20 DIAGNOSIS — R69 Illness, unspecified: Secondary | ICD-10-CM | POA: Diagnosis not present

## 2019-03-31 DIAGNOSIS — E039 Hypothyroidism, unspecified: Secondary | ICD-10-CM | POA: Diagnosis not present

## 2019-03-31 DIAGNOSIS — R61 Generalized hyperhidrosis: Secondary | ICD-10-CM | POA: Diagnosis not present

## 2019-03-31 DIAGNOSIS — R69 Illness, unspecified: Secondary | ICD-10-CM | POA: Diagnosis not present

## 2019-03-31 DIAGNOSIS — J449 Chronic obstructive pulmonary disease, unspecified: Secondary | ICD-10-CM | POA: Diagnosis not present

## 2019-04-06 ENCOUNTER — Other Ambulatory Visit: Payer: Self-pay | Admitting: Psychiatry

## 2019-04-21 DIAGNOSIS — S61212A Laceration without foreign body of right middle finger without damage to nail, initial encounter: Secondary | ICD-10-CM | POA: Diagnosis not present

## 2019-05-20 DIAGNOSIS — R69 Illness, unspecified: Secondary | ICD-10-CM | POA: Diagnosis not present

## 2019-06-09 DIAGNOSIS — M542 Cervicalgia: Secondary | ICD-10-CM | POA: Diagnosis not present

## 2019-06-09 DIAGNOSIS — M4712 Other spondylosis with myelopathy, cervical region: Secondary | ICD-10-CM | POA: Diagnosis not present

## 2019-06-09 DIAGNOSIS — M9902 Segmental and somatic dysfunction of thoracic region: Secondary | ICD-10-CM | POA: Diagnosis not present

## 2019-06-09 DIAGNOSIS — M810 Age-related osteoporosis without current pathological fracture: Secondary | ICD-10-CM | POA: Diagnosis not present

## 2019-06-09 DIAGNOSIS — M9901 Segmental and somatic dysfunction of cervical region: Secondary | ICD-10-CM | POA: Diagnosis not present

## 2019-06-10 DIAGNOSIS — M9902 Segmental and somatic dysfunction of thoracic region: Secondary | ICD-10-CM | POA: Diagnosis not present

## 2019-06-10 DIAGNOSIS — M542 Cervicalgia: Secondary | ICD-10-CM | POA: Diagnosis not present

## 2019-06-10 DIAGNOSIS — M9901 Segmental and somatic dysfunction of cervical region: Secondary | ICD-10-CM | POA: Diagnosis not present

## 2019-06-10 DIAGNOSIS — M4712 Other spondylosis with myelopathy, cervical region: Secondary | ICD-10-CM | POA: Diagnosis not present

## 2019-06-12 DIAGNOSIS — M4712 Other spondylosis with myelopathy, cervical region: Secondary | ICD-10-CM | POA: Diagnosis not present

## 2019-06-12 DIAGNOSIS — M542 Cervicalgia: Secondary | ICD-10-CM | POA: Diagnosis not present

## 2019-06-12 DIAGNOSIS — M9902 Segmental and somatic dysfunction of thoracic region: Secondary | ICD-10-CM | POA: Diagnosis not present

## 2019-06-12 DIAGNOSIS — M9901 Segmental and somatic dysfunction of cervical region: Secondary | ICD-10-CM | POA: Diagnosis not present

## 2019-06-16 DIAGNOSIS — M9901 Segmental and somatic dysfunction of cervical region: Secondary | ICD-10-CM | POA: Diagnosis not present

## 2019-06-16 DIAGNOSIS — M9902 Segmental and somatic dysfunction of thoracic region: Secondary | ICD-10-CM | POA: Diagnosis not present

## 2019-06-16 DIAGNOSIS — M542 Cervicalgia: Secondary | ICD-10-CM | POA: Diagnosis not present

## 2019-06-16 DIAGNOSIS — M4712 Other spondylosis with myelopathy, cervical region: Secondary | ICD-10-CM | POA: Diagnosis not present

## 2019-06-17 DIAGNOSIS — M4712 Other spondylosis with myelopathy, cervical region: Secondary | ICD-10-CM | POA: Diagnosis not present

## 2019-06-17 DIAGNOSIS — M9902 Segmental and somatic dysfunction of thoracic region: Secondary | ICD-10-CM | POA: Diagnosis not present

## 2019-06-17 DIAGNOSIS — M542 Cervicalgia: Secondary | ICD-10-CM | POA: Diagnosis not present

## 2019-06-17 DIAGNOSIS — M9901 Segmental and somatic dysfunction of cervical region: Secondary | ICD-10-CM | POA: Diagnosis not present

## 2019-06-19 DIAGNOSIS — M4712 Other spondylosis with myelopathy, cervical region: Secondary | ICD-10-CM | POA: Diagnosis not present

## 2019-06-19 DIAGNOSIS — M542 Cervicalgia: Secondary | ICD-10-CM | POA: Diagnosis not present

## 2019-06-19 DIAGNOSIS — M9901 Segmental and somatic dysfunction of cervical region: Secondary | ICD-10-CM | POA: Diagnosis not present

## 2019-06-19 DIAGNOSIS — M9902 Segmental and somatic dysfunction of thoracic region: Secondary | ICD-10-CM | POA: Diagnosis not present

## 2019-06-26 DIAGNOSIS — M9902 Segmental and somatic dysfunction of thoracic region: Secondary | ICD-10-CM | POA: Diagnosis not present

## 2019-06-26 DIAGNOSIS — M542 Cervicalgia: Secondary | ICD-10-CM | POA: Diagnosis not present

## 2019-06-26 DIAGNOSIS — M4712 Other spondylosis with myelopathy, cervical region: Secondary | ICD-10-CM | POA: Diagnosis not present

## 2019-06-26 DIAGNOSIS — M9901 Segmental and somatic dysfunction of cervical region: Secondary | ICD-10-CM | POA: Diagnosis not present

## 2019-06-30 DIAGNOSIS — M542 Cervicalgia: Secondary | ICD-10-CM | POA: Diagnosis not present

## 2019-06-30 DIAGNOSIS — M4712 Other spondylosis with myelopathy, cervical region: Secondary | ICD-10-CM | POA: Diagnosis not present

## 2019-06-30 DIAGNOSIS — M9902 Segmental and somatic dysfunction of thoracic region: Secondary | ICD-10-CM | POA: Diagnosis not present

## 2019-06-30 DIAGNOSIS — M9901 Segmental and somatic dysfunction of cervical region: Secondary | ICD-10-CM | POA: Diagnosis not present

## 2019-07-01 DIAGNOSIS — M542 Cervicalgia: Secondary | ICD-10-CM | POA: Diagnosis not present

## 2019-07-01 DIAGNOSIS — M9901 Segmental and somatic dysfunction of cervical region: Secondary | ICD-10-CM | POA: Diagnosis not present

## 2019-07-01 DIAGNOSIS — M4712 Other spondylosis with myelopathy, cervical region: Secondary | ICD-10-CM | POA: Diagnosis not present

## 2019-07-01 DIAGNOSIS — M9902 Segmental and somatic dysfunction of thoracic region: Secondary | ICD-10-CM | POA: Diagnosis not present

## 2019-07-03 DIAGNOSIS — M542 Cervicalgia: Secondary | ICD-10-CM | POA: Diagnosis not present

## 2019-07-03 DIAGNOSIS — M9902 Segmental and somatic dysfunction of thoracic region: Secondary | ICD-10-CM | POA: Diagnosis not present

## 2019-07-03 DIAGNOSIS — M9901 Segmental and somatic dysfunction of cervical region: Secondary | ICD-10-CM | POA: Diagnosis not present

## 2019-07-03 DIAGNOSIS — M4712 Other spondylosis with myelopathy, cervical region: Secondary | ICD-10-CM | POA: Diagnosis not present

## 2019-07-07 DIAGNOSIS — M9901 Segmental and somatic dysfunction of cervical region: Secondary | ICD-10-CM | POA: Diagnosis not present

## 2019-07-07 DIAGNOSIS — M9902 Segmental and somatic dysfunction of thoracic region: Secondary | ICD-10-CM | POA: Diagnosis not present

## 2019-07-07 DIAGNOSIS — M4712 Other spondylosis with myelopathy, cervical region: Secondary | ICD-10-CM | POA: Diagnosis not present

## 2019-07-07 DIAGNOSIS — M542 Cervicalgia: Secondary | ICD-10-CM | POA: Diagnosis not present

## 2019-07-10 DIAGNOSIS — M542 Cervicalgia: Secondary | ICD-10-CM | POA: Diagnosis not present

## 2019-07-10 DIAGNOSIS — J449 Chronic obstructive pulmonary disease, unspecified: Secondary | ICD-10-CM | POA: Diagnosis not present

## 2019-07-10 DIAGNOSIS — M810 Age-related osteoporosis without current pathological fracture: Secondary | ICD-10-CM | POA: Diagnosis not present

## 2019-07-10 DIAGNOSIS — M9901 Segmental and somatic dysfunction of cervical region: Secondary | ICD-10-CM | POA: Diagnosis not present

## 2019-07-10 DIAGNOSIS — M4712 Other spondylosis with myelopathy, cervical region: Secondary | ICD-10-CM | POA: Diagnosis not present

## 2019-07-10 DIAGNOSIS — E785 Hyperlipidemia, unspecified: Secondary | ICD-10-CM | POA: Diagnosis not present

## 2019-07-10 DIAGNOSIS — R69 Illness, unspecified: Secondary | ICD-10-CM | POA: Diagnosis not present

## 2019-07-10 DIAGNOSIS — E039 Hypothyroidism, unspecified: Secondary | ICD-10-CM | POA: Diagnosis not present

## 2019-07-10 DIAGNOSIS — M9902 Segmental and somatic dysfunction of thoracic region: Secondary | ICD-10-CM | POA: Diagnosis not present

## 2019-07-15 DIAGNOSIS — M542 Cervicalgia: Secondary | ICD-10-CM | POA: Diagnosis not present

## 2019-07-15 DIAGNOSIS — M9901 Segmental and somatic dysfunction of cervical region: Secondary | ICD-10-CM | POA: Diagnosis not present

## 2019-07-15 DIAGNOSIS — M4712 Other spondylosis with myelopathy, cervical region: Secondary | ICD-10-CM | POA: Diagnosis not present

## 2019-07-15 DIAGNOSIS — M9902 Segmental and somatic dysfunction of thoracic region: Secondary | ICD-10-CM | POA: Diagnosis not present

## 2019-07-22 DIAGNOSIS — M4712 Other spondylosis with myelopathy, cervical region: Secondary | ICD-10-CM | POA: Diagnosis not present

## 2019-07-22 DIAGNOSIS — M542 Cervicalgia: Secondary | ICD-10-CM | POA: Diagnosis not present

## 2019-07-22 DIAGNOSIS — M9902 Segmental and somatic dysfunction of thoracic region: Secondary | ICD-10-CM | POA: Diagnosis not present

## 2019-07-22 DIAGNOSIS — M9901 Segmental and somatic dysfunction of cervical region: Secondary | ICD-10-CM | POA: Diagnosis not present

## 2019-07-24 DIAGNOSIS — M9901 Segmental and somatic dysfunction of cervical region: Secondary | ICD-10-CM | POA: Diagnosis not present

## 2019-07-24 DIAGNOSIS — M542 Cervicalgia: Secondary | ICD-10-CM | POA: Diagnosis not present

## 2019-07-24 DIAGNOSIS — M9902 Segmental and somatic dysfunction of thoracic region: Secondary | ICD-10-CM | POA: Diagnosis not present

## 2019-07-24 DIAGNOSIS — M4712 Other spondylosis with myelopathy, cervical region: Secondary | ICD-10-CM | POA: Diagnosis not present

## 2019-07-31 DIAGNOSIS — M542 Cervicalgia: Secondary | ICD-10-CM | POA: Diagnosis not present

## 2019-07-31 DIAGNOSIS — M9901 Segmental and somatic dysfunction of cervical region: Secondary | ICD-10-CM | POA: Diagnosis not present

## 2019-07-31 DIAGNOSIS — M9902 Segmental and somatic dysfunction of thoracic region: Secondary | ICD-10-CM | POA: Diagnosis not present

## 2019-07-31 DIAGNOSIS — M4712 Other spondylosis with myelopathy, cervical region: Secondary | ICD-10-CM | POA: Diagnosis not present

## 2019-08-05 DIAGNOSIS — M542 Cervicalgia: Secondary | ICD-10-CM | POA: Diagnosis not present

## 2019-08-05 DIAGNOSIS — M9902 Segmental and somatic dysfunction of thoracic region: Secondary | ICD-10-CM | POA: Diagnosis not present

## 2019-08-05 DIAGNOSIS — M4712 Other spondylosis with myelopathy, cervical region: Secondary | ICD-10-CM | POA: Diagnosis not present

## 2019-08-05 DIAGNOSIS — M9901 Segmental and somatic dysfunction of cervical region: Secondary | ICD-10-CM | POA: Diagnosis not present

## 2019-08-06 DIAGNOSIS — R55 Syncope and collapse: Secondary | ICD-10-CM | POA: Diagnosis not present

## 2019-08-07 DIAGNOSIS — M9901 Segmental and somatic dysfunction of cervical region: Secondary | ICD-10-CM | POA: Diagnosis not present

## 2019-08-07 DIAGNOSIS — M542 Cervicalgia: Secondary | ICD-10-CM | POA: Diagnosis not present

## 2019-08-07 DIAGNOSIS — M9902 Segmental and somatic dysfunction of thoracic region: Secondary | ICD-10-CM | POA: Diagnosis not present

## 2019-08-07 DIAGNOSIS — M4712 Other spondylosis with myelopathy, cervical region: Secondary | ICD-10-CM | POA: Diagnosis not present

## 2019-08-08 ENCOUNTER — Telehealth: Payer: Self-pay | Admitting: Psychiatry

## 2019-08-08 NOTE — Telephone Encounter (Signed)
There is nothing she is taking from me that should be a problem.  There is nothing taking that should cause blackouts as long as she has a normal EKG.  I am glad she is being referred to a cardiologist.  However if her regular doctor says her EKG is okay and there are no psych med concerns here.

## 2019-08-08 NOTE — Telephone Encounter (Signed)
Pt. Made aware and verbalized understanding.

## 2019-08-08 NOTE — Telephone Encounter (Signed)
Patient called and said that on Saturday she was at [facility] and she blacked out. She went to the PCP and they referred to a cardiologist. She wants to know if any of her medicines that she takes would cause her to do that. She is very scared and doesn't remember anything about what happened? Please give her a calla t [phone]

## 2019-08-08 NOTE — Telephone Encounter (Signed)
Noted thank you

## 2019-08-11 DIAGNOSIS — M542 Cervicalgia: Secondary | ICD-10-CM | POA: Diagnosis not present

## 2019-08-11 DIAGNOSIS — M9902 Segmental and somatic dysfunction of thoracic region: Secondary | ICD-10-CM | POA: Diagnosis not present

## 2019-08-11 DIAGNOSIS — M9901 Segmental and somatic dysfunction of cervical region: Secondary | ICD-10-CM | POA: Diagnosis not present

## 2019-08-11 DIAGNOSIS — M4712 Other spondylosis with myelopathy, cervical region: Secondary | ICD-10-CM | POA: Diagnosis not present

## 2019-08-18 DIAGNOSIS — M9901 Segmental and somatic dysfunction of cervical region: Secondary | ICD-10-CM | POA: Diagnosis not present

## 2019-08-18 DIAGNOSIS — M4712 Other spondylosis with myelopathy, cervical region: Secondary | ICD-10-CM | POA: Diagnosis not present

## 2019-08-18 DIAGNOSIS — M9902 Segmental and somatic dysfunction of thoracic region: Secondary | ICD-10-CM | POA: Diagnosis not present

## 2019-08-18 DIAGNOSIS — M542 Cervicalgia: Secondary | ICD-10-CM | POA: Diagnosis not present

## 2019-08-19 DIAGNOSIS — M4712 Other spondylosis with myelopathy, cervical region: Secondary | ICD-10-CM | POA: Diagnosis not present

## 2019-08-19 DIAGNOSIS — M9902 Segmental and somatic dysfunction of thoracic region: Secondary | ICD-10-CM | POA: Diagnosis not present

## 2019-08-19 DIAGNOSIS — M542 Cervicalgia: Secondary | ICD-10-CM | POA: Diagnosis not present

## 2019-08-19 DIAGNOSIS — M9901 Segmental and somatic dysfunction of cervical region: Secondary | ICD-10-CM | POA: Diagnosis not present

## 2019-08-21 DIAGNOSIS — M4712 Other spondylosis with myelopathy, cervical region: Secondary | ICD-10-CM | POA: Diagnosis not present

## 2019-08-21 DIAGNOSIS — M9901 Segmental and somatic dysfunction of cervical region: Secondary | ICD-10-CM | POA: Diagnosis not present

## 2019-08-21 DIAGNOSIS — M542 Cervicalgia: Secondary | ICD-10-CM | POA: Diagnosis not present

## 2019-08-21 DIAGNOSIS — M9902 Segmental and somatic dysfunction of thoracic region: Secondary | ICD-10-CM | POA: Diagnosis not present

## 2019-08-25 ENCOUNTER — Encounter: Payer: Self-pay | Admitting: Internal Medicine

## 2019-08-25 ENCOUNTER — Other Ambulatory Visit: Payer: Self-pay

## 2019-08-25 ENCOUNTER — Telehealth: Payer: Self-pay | Admitting: *Deleted

## 2019-08-25 ENCOUNTER — Ambulatory Visit: Payer: Medicare HMO | Admitting: Internal Medicine

## 2019-08-25 VITALS — BP 108/74 | HR 67 | Temp 97.3°F | Ht 67.0 in | Wt 126.0 lb

## 2019-08-25 DIAGNOSIS — E039 Hypothyroidism, unspecified: Secondary | ICD-10-CM

## 2019-08-25 DIAGNOSIS — E785 Hyperlipidemia, unspecified: Secondary | ICD-10-CM

## 2019-08-25 DIAGNOSIS — Q231 Congenital insufficiency of aortic valve: Secondary | ICD-10-CM

## 2019-08-25 DIAGNOSIS — Q2381 Bicuspid aortic valve: Secondary | ICD-10-CM

## 2019-08-25 DIAGNOSIS — J449 Chronic obstructive pulmonary disease, unspecified: Secondary | ICD-10-CM

## 2019-08-25 DIAGNOSIS — R55 Syncope and collapse: Secondary | ICD-10-CM

## 2019-08-25 DIAGNOSIS — K219 Gastro-esophageal reflux disease without esophagitis: Secondary | ICD-10-CM

## 2019-08-25 NOTE — Telephone Encounter (Signed)
Please call [phone] regarding cardiac event monitor ordered by Dr. Margaretann Loveless.

## 2019-08-25 NOTE — Patient Instructions (Addendum)
Medication Instructions:  Your physician recommends that you continue on your current medications as directed. Please refer to the Current Medication list given to you today.  *If you need a refill on your cardiac medications before your next appointment, please call your pharmacy*  Lab Work: NONE   Testing/Procedures: Your physician has requested that you have an echocardiogram. Echocardiography is a painless test that uses sound waves to create images of your heart. It provides your doctor with information about the size and shape of your heart and how well your hearts chambers and valves are working. This procedure takes approximately one hour. There are no restrictions for this procedure. [address]  Your physician has recommended that you wear an event monitor. Event monitors are medical devices that record the hearts electrical activity. Doctors most often [place] these monitors to diagnose arrhythmias. Arrhythmias are problems with the speed or rhythm of the heartbeat. The monitor is a small, portable device. You can wear one while you do your normal daily activities. This is usually used to diagnose what is causing palpitations/syncope (passing out). 1 DAY   Follow-Up: At [facility], you and your health needs are our priority.  As part of our continuing mission to provide you with exceptional heart care, we have created designated Provider Care Teams.  These Care Teams include your primary Cardiologist (physician) and Advanced Practice Providers (APPs -  Physician Assistants and Nurse Practitioners) who all work together to provide you with the care you need, when you need it.  Your next appointment:   6 week(s)  The format for your next appointment:   In Person  Provider:   You may see DR Margaretann Loveless or one of the following Advanced Practice Providers on your designated Care Team:    Rosaria Ferries, PA-C  Jory Sims, DNP, ANP  Cadence Kathlen Mody,  NP   Other Instructions  Try using compression socks   Ambulatory Cardiac Monitoring An ambulatory cardiac monitor is a small recording device that is used to detect abnormal heart rhythms (arrhythmias). Most monitors are connected by wires to flat, sticky disks (electrodes) that are then attached to your chest. You may need to wear a monitor if you have had symptoms such as:  Fast heartbeats (palpitations).  Dizziness.  Fainting or light-headedness.  Unexplained weakness.  Shortness of breath. There are several types of monitors. Some common monitors include:  Holter monitor. This records your heart rhythm continuously, usually for 24-48 hours.  Event (episodic) monitor. This monitor has a symptoms button, and when pushed, it will begin recording. You need to activate this monitor to record when you have a heart-related symptom.  Automatic detection monitor. This monitor will begin recording when it detects an abnormal heartbeat. What are the risks? Generally, these devices are safe to use. However, it is possible that the skin under the electrodes will become irritated. How to prepare for monitoring Your health care provider will prepare your chest for the electrode placement and show you how to use the monitor.  Do not apply lotions to your chest before monitoring.  Follow directions on how to care for the monitor, and how to return the monitor when the testing period is complete. How to use your cardiac monitor  Follow directions about how long to wear the monitor, and if you can take the monitor off in order to shower or bathe. ? Do not let the monitor get wet. ? Do not bathe, swim, or use  a hot tub while wearing the monitor.  Keep your skin clean. Do not put body lotion or moisturizer on your chest.  Change the electrodes as told by your health care provider, or any time they stop sticking to your skin. You may need to use medical tape to keep them on.  Try to put  the electrodes in slightly different places on your chest to help prevent skin irritation. Follow directions from your health care provider about where to place the electrodes.  Make sure the monitor is safely clipped to your clothing or in a location close to your body as recommended by your health care provider.  If your monitor has a symptoms button, press the button to mark an event as soon as you feel a heart-related symptom, such as: ? Dizziness. ? Weakness. ? Light-headedness. ? Palpitations. ? Thumping or pounding in your chest. ? Shortness of breath. ? Unexplained weakness.  Keep a diary of your activities, such as walking, doing chores, and taking medicine. It is very important to note what you were doing when you pushed the button to record your symptoms. This will help your health care provider determine what might be contributing to your symptoms.  Send the recorded information as recommended by your health care provider. It may take some time for your health care provider to process the results.  Change the batteries as told by your health care provider.  Keep electronic devices away from your monitor. These include: ? Tablets. ? MP3 players. ? Cell phones.  While wearing your monitor you should avoid: ? Electric blankets. ? Armed forces operational officer. ? Electric toothbrushes. ? Microwave ovens. ? Magnets. ? Metal detectors. Get help right away if:  You have chest pain.  You have shortness of breath or extreme difficulty breathing.  You develop a very fast heartbeat that does not get better.  You develop dizziness that does not go away.  You faint or constantly feel like you are about to faint. Summary  An ambulatory cardiac monitor is a small recording device that is used to detect abnormal heart rhythms (arrhythmias).  Make sure you understand how to send the information from the monitor to your health care provider.  It is important to press the button on the  monitor when you have any heart-related symptoms.  Keep a diary of your activities, such as walking, doing chores, and taking medicine. It is very important to note what you were doing when you pushed the button to record your symptoms. This will help your health care provider learn what might be causing your symptoms. This information is not intended to replace advice given to you by your health care provider. Make sure you discuss any questions you have with your health care provider. Document Released: 06/13/2008 Document Revised: 08/17/2017 Document Reviewed: 08/19/2016 Elsevier Patient Education  [employer].  Echocardiogram An echocardiogram is a procedure that uses painless sound waves (ultrasound) to produce an image of the heart. Images from an echocardiogram can provide important information about:  Signs of coronary artery disease (CAD).  Aneurysm detection. An aneurysm is a weak or damaged part of an artery wall that bulges out from the normal force of blood pumping through the body.  Heart size and shape. Changes in the size or shape of the heart can be associated with certain conditions, including heart failure, aneurysm, and CAD.  Heart muscle function.  Heart valve function.  Signs of a past heart attack.  Fluid buildup around the heart.  Thickening of the heart muscle.  A tumor or infectious growth around the heart valves. Tell a health care provider about:  Any allergies you have.  All medicines you are taking, including vitamins, herbs, eye drops, creams, and over-the-counter medicines.  Any blood disorders you have.  Any surgeries you have had.  Any medical conditions you have.  Whether you are pregnant or may be pregnant. What are the risks? Generally, this is a safe procedure. However, problems may occur, including:  Allergic reaction to dye (contrast) that may be used during the procedure. What happens before the procedure? No specific  preparation is needed. You may eat and drink normally. What happens during the procedure?   An IV tube may be inserted into one of your veins.  You may receive contrast through this tube. A contrast is an injection that improves the quality of the pictures from your heart.  A gel will be applied to your chest.  A wand-like tool (transducer) will be moved over your chest. The gel will help to transmit the sound waves from the transducer.  The sound waves will harmlessly bounce off of your heart to allow the heart images to be captured in real-time motion. The images will be recorded on a computer. The procedure may vary among health care providers and hospitals. What happens after the procedure?  You may return to your normal, everyday life, including diet, activities, and medicines, unless your health care provider tells you not to do that. Summary  An echocardiogram is a procedure that uses painless sound waves (ultrasound) to produce an image of the heart.  Images from an echocardiogram can provide important information about the size and shape of your heart, heart muscle function, heart valve function, and fluid buildup around your heart.  You do not need to do anything to prepare before this procedure. You may eat and drink normally.  After the echocardiogram is completed, you may return to your normal, everyday life, unless your health care provider tells you not to do that. This information is not intended to replace advice given to you by your health care provider. Make sure you discuss any questions you have with your health care provider. Document Released: 09/01/2000 Document Revised: 12/26/2018 Document Reviewed: 10/07/2016 Elsevier Patient Education  2020 [employer].

## 2019-08-25 NOTE — Progress Notes (Signed)
Cardiology Office Note:    Date:  08/25/2019   ID:  [name], DOB 09/08/1956, MRN [id number]  PCP:  [name], MD  Cardiologist:  No primary care provider on file.  Electrophysiologist:  None   Referring MD: [name], MD   Chief Complaint: Syncope, history of bicuspid aortic valve.  History of Present Illness:    [name] is a 63 y.o. female with a hx of aortic valve stenosis which is mild, bicuspid aortic valve, COPD, GERD, hypothyroidism.  She is a previous patient of Dr. Tollie Eth.  She presents today after a syncopal episode on November 14.  She tells me she was shopping at [facility] and when she went out into the parking lot she blacked out she does not remember having a prodrome she was quite dizzy.  She remembers reaching for her shopping cart after she awoke.  She has never had syncope in her lifetime prior to this.  She has had episodes of presyncope approximately every 6 months.  She did not present to the hospital after her syncopal episode.  She inquires today if she is allowed to drive.   I have guided her on driving restrictions after after syncopal episode.  She has a history of a bicuspid aortic valve and on last echocardiogram performed at Dr. Thurman Coyer office she has been known to have mild aortic valve stenosis and mild aortic valve regurgitation per EMR.  She has no known family history of bicuspid aortic valve and I do not see report of a dilated a sending aorta or coarctation of the aorta in conjunction.  Ever since her syncopal episode she notes she has very low energy.  She had laboratory studies with her primary physician which were within the normal limits including a normal TSH.  She has been a smoker for approximately 50 years and has previously smoked 1 pack/day, she has now cut down to half a pack per day.   She endorses SOB with exertion after 30 min, and when she goes up hills. Chest pain at rest or with exertion, which will go away if  she rubs her chest the symptoms happen regularly, however she cannot quantify how often this is occurring. No snoring.  She does endorse orthopnea. No leg swelling.   She has a very strong family history of heart disease.  Fhx heart dz - father brain aneurysm, pgm MI, aunt death during open heart. Mom - chf, brothers had MI.   She has hyperlipidemia with an LDL of 112.  The patient denies palpitations, PND, or leg swelling.  Past Medical History:  Diagnosis Date  . Bipolar disorder (HCC)    PT ON TOPIRAMATE AND ZANAX- SEES PSYCHIATRIST DR. COTTLE - PT STATES DOING WELL ON MEDICATIONS  . COPD (chronic obstructive pulmonary disease) ([place])    OCCAS USE OF INHALER-QUIT SMOKING 2014 - USING E- CIGARETTE  . Diverticulitis   . GERD (gastroesophageal reflux disease)   . H/O suicide attempt 2007   PT'S SON HAD COMMITTED SUICIDE AND SHE WAS OVER COME WITH GRIEF  . Hypothyroidism   . IBS (irritable bowel syndrome)   . Lichen simplex chronicus   . MVP (mitral valve prolapse)    DOES NOT CAUSE ANY PROBLEMS  . Osteoporosis   . Pain    RIGHT SHOULDER AND LIMITED ROM - -RT SHOULDER IMPINGEMENT, ADHESIVE CAPSULITIS, ROTATOR CUFF TEAR  . Pain    NECK PAIN -PT HOPING SURGERY ON HER SHOULDER HELPS HER NECK PAIN- DOES  HAVE HX OF CERBVICAL DISK FUSION.  . Pain    LUMBAR PAIN - DDD AND PREVIOUS LUMBAR SURGERY  . Vitamin D deficiency     Past Surgical History:  Procedure Laterality Date  . ABDOMINAL HYSTERECTOMY  1975  . BACK SURGERY     LUMBAR SURGERY FOR HNP  . BREAST SURGERY     BREAST REDUCTION  . CERVICAL DISK FUSION    . LEFT OOPHORECTOMY  1994  . SHOULDER ARTHROSCOPY WITH SUBACROMIAL DECOMPRESSION AND OPEN ROTATOR C Right 02/19/2014   Procedure: RIGHT SHOULDER ARTHROSCOPY SUBACROMIAL DECOMPRESSION EVALUATION UNDER ANESTHESIA AND  MANIPULATION UNDER ANESTHIESIA DEBRIDEMENT OF PARTIAL ROTATOR CUFF;  Surgeon: Johnn Hai, MD;  Location: [facility];  Service: Orthopedics;  Laterality: Right;     Current Medications: Current Meds  Medication Sig  . Acetylcysteine (N-ACETYL-L-CYSTEINE) 600 MG CAPS Take 600 mg by mouth daily.  Marland Kitchen albuterol (PROVENTIL HFA;VENTOLIN HFA) 108 (90 BASE) MCG/ACT inhaler Inhale 1 puff into the lungs every 6 (six) hours as needed for wheezing or shortness of breath.  . ALPRAZolam (XANAX) 0.5 MG tablet Take 1 tablet (0.5 mg total) by mouth 4 (four) times daily.  . citalopram (CELEXA) 20 MG tablet Take 1.5 tablets (30 mg total) by mouth daily.  Marland Kitchen denosumab (PROLIA) 60 MG/ML SOLN injection Inject 60 mg into the skin every 6 (six) months. Administer in upper arm, thigh, or abdomen  . dexlansoprazole (DEXILANT) 60 MG capsule Take 60 mg by mouth daily.  Marland Kitchen ketorolac (TORADOL) 10 MG tablet Take 1 tablet (10 mg total) by mouth every 6 (six) hours as needed.  Marland Kitchen levothyroxine (SYNTHROID, LEVOTHROID) 75 MCG tablet Take 88 mcg by mouth daily before breakfast.   . Lurasidone HCl 120 MG TABS Take 120 mg by mouth daily.  . methocarbamol (ROBAXIN) 500 MG tablet Take 1 tablet (500 mg total) by mouth 3 (three) times daily between meals as needed for muscle spasms.  Marland Kitchen omeprazole (PRILOSEC) 40 MG capsule Take 40 mg by mouth daily.  Marland Kitchen oxyCODONE-acetaminophen (PERCOCET) 10-325 MG per tablet Take 1 tablet by mouth every 6 (six) hours as needed for pain.  Marland Kitchen oxyCODONE-acetaminophen (PERCOCET) 10-325 MG per tablet Take 1 tablet by mouth every 4 (four) hours as needed for pain.  Marland Kitchen topiramate (TOPAMAX) 100 MG tablet TAKE 1 & 1/2 TABLETS BY MOUTH DAILY  . Vitamin D, Ergocalciferol, (DRISDOL) 50000 UNITS CAPS capsule Take 50,000 Units by mouth every 7 (seven) days.     Allergies:   Alendronate sodium, Fiorinal [butalbital-aspirin-caffeine], Iohexol, Klonopin [clonazepam], Other, Tramadol, and Dexilant [dexlansoprazole]   Social History   Socioeconomic History  . Marital status: Married    Spouse name: Not on file  . Number of children: Not on file  . Years of education: Not on file  .  Highest education level: Not on file  Occupational History  . Not on file  Social Needs  . Financial resource strain: Not on file  . Food insecurity    Worry: Not on file    Inability: Not on file  . Transportation needs    Medical: Not on file    Non-medical: Not on file  Tobacco Use  . Smoking status: Former Smoker    Packs/day: 1.00    Years: 40.00    Pack years: 40.00    Types: Cigarettes    Quit date: 10/06/2012    Years since quitting: 6.8  . Smokeless tobacco: Current User  Substance and Sexual Activity  . Alcohol use: No  Comment: SMOKING E- CIGARETTES  . Drug use: No  . Sexual activity: Not on file  Lifestyle  . Physical activity    Days per week: Not on file    Minutes per session: Not on file  . Stress: Not on file  Relationships  . Social Herbalist on phone: Not on file    Gets together: Not on file    Attends religious service: Not on file    Active member of club or organization: Not on file    Attends meetings of clubs or organizations: Not on file    Relationship status: Not on file  Other Topics Concern  . Not on file  Social History Narrative  . Not on file     Family History: The patient's family history includes Heart attack in her paternal grandmother; Heart disease in her father and mother; Heart failure in her mother; Stroke in her mother.  ROS:   Please see the history of present illness.    All other systems reviewed and are negative.  EKGs/Labs/Other Studies Reviewed:    The following studies were reviewed today:  EKG: Normal sinus rhythm, low voltage QRS, possible septal infarct  Epworth Sleepiness Scale: Not performed today  Recent Labs: No results found for requested labs within last 8760 hours.  Recent Lipid Panel No results found for: CHOL, TRIG, HDL, CHOLHDL, VLDL, LDLCALC, LDLDIRECT  Physical Exam:    VS:  BP 108/74 (BP Location: Right Arm, Patient Position: Sitting, Cuff Size: Normal)   Pulse 67   Temp  (!) 97.3 F (36.3 C)   Ht 5\' 7"  (1.702 m)   Wt 126 lb (57.2 kg)   BMI 19.73 kg/m     Wt Readings from Last 5 Encounters:  08/25/19 126 lb (57.2 kg)  02/11/14 143 lb (64.9 kg)     Constitutional: No acute distress Eyes: sclera non-icteric, normal conjunctiva and lids ENMT: normal dentition, moist mucous membranes Cardiovascular: regular rhythm, normal rate.  A midsystolic click is appreciated.  1 out of 6 systolic murmur S1 and S2 normal. Radial pulses normal bilaterally. No jugular venous distention.  Respiratory: clear to auscultation bilaterally GI : normal bowel sounds, soft and nontender. No distention.   MSK: extremities warm, well perfused. No edema.  NEURO: grossly nonfocal exam, moves all extremities. PSYCH: alert and oriented x 3, normal mood and affect.      ASSESSMENT:    1. Syncope, unspecified syncope type   2. Bicuspid aortic valve   3. Chronic obstructive pulmonary disease, unspecified COPD type ([place])   4. Gastroesophageal reflux disease, unspecified whether esophagitis present   5. Hypothyroidism, unspecified type   6. Hyperlipidemia, unspecified hyperlipidemia type    PLAN:    Syncope-concerned about her episode of syncope, it would be best to evaluate with an echocardiogram.  We will also reevaluate her bicuspid aortic valve.  By exam it does not seem that she has severe aortic stenosis.  On echocardiography please evaluate the a sending aorta and evaluate for coarctation of the aorta, potentially concomitant conditions.  BAV-patient has been counseled on inheritance pattern of bicuspid aortic valve as well as thoracic aortic aneurysm.  HLD -she is on atorvastatin 20 mg daily, we can continue this therapy and reevaluate lipids.  Mild lower extremity swelling-I have recommended for the patient to use compression socks and she will try these.  TIME SPENT WITH PATIENT: 25 minutes of direct patient care. More than 50% of that time was spent on  coordination  of care and counseling regarding syncope, counseling on bicuspid aortic valve.  Cherlynn Kaiser, MD   [facility]   Medication Adjustments/Labs and Tests Ordered: Current medicines are reviewed at length with the patient today.  Concerns regarding medicines are outlined above.  Orders Placed This Encounter  Procedures  . Cardiac event monitor  . EKG 12-Lead  . ECHOCARDIOGRAM COMPLETE   No orders of the defined types were placed in this encounter.   Patient Instructions  Medication Instructions:  Your physician recommends that you continue on your current medications as directed. Please refer to the Current Medication list given to you today.  *If you need a refill on your cardiac medications before your next appointment, please call your pharmacy*  Lab Work: NONE   Testing/Procedures: Your physician has requested that you have an echocardiogram. Echocardiography is a painless test that uses sound waves to create images of your heart. It provides your doctor with information about the size and shape of your heart and how well your heart's chambers and valves are working. This procedure takes approximately one hour. There are no restrictions for this procedure. [place] [address]  Your physician has recommended that you wear an event monitor. Event monitors are medical devices that record the heart's electrical activity. Doctors most often [place] these monitors to diagnose arrhythmias. Arrhythmias are problems with the speed or rhythm of the heartbeat. The monitor is a small, portable device. You can wear one while you do your normal daily activities. This is usually used to diagnose what is causing palpitations/syncope (passing out). 30 DAY   Follow-Up: At [facility], you and your health needs are our priority.  As part of our continuing mission to provide you with exceptional heart care, we have created designated Provider Care Teams.  These  Care Teams include your primary Cardiologist (physician) and Advanced Practice Providers (APPs -  Physician Assistants and Nurse Practitioners) who all work together to provide you with the care you need, when you need it.  Your next appointment:   6 week(s)  The format for your next appointment:   In Person  Provider:   You may see DR Margaretann Loveless or one of the following Advanced Practice Providers on your designated Care Team:    Rosaria Ferries, PA-C  Jory Sims, DNP, ANP  Cadence Kathlen Mody, NP   Other Instructions  Try using compression socks   Ambulatory Cardiac Monitoring An ambulatory cardiac monitor is a small recording device that is used to detect abnormal heart rhythms (arrhythmias). Most monitors are connected by wires to flat, sticky disks (electrodes) that are then attached to your chest. You may need to wear a monitor if you have had symptoms such as:  Fast heartbeats (palpitations).  Dizziness.  Fainting or light-headedness.  Unexplained weakness.  Shortness of breath. There are several types of monitors. Some common monitors include:  Holter monitor. This records your heart rhythm continuously, usually for 24-48 hours.  Event (episodic) monitor. This monitor has a symptoms button, and when pushed, it will begin recording. You need to activate this monitor to record when you have a heart-related symptom.  Automatic detection monitor. This monitor will begin recording when it detects an abnormal heartbeat. What are the risks? Generally, these devices are safe to use. However, it is possible that the skin under the electrodes will become irritated. How to prepare for monitoring Your health care provider will prepare your chest for the electrode placement and  show you how to use the monitor.  Do not apply lotions to your chest before monitoring.  Follow directions on how to care for the monitor, and how to return the monitor when the testing period is  complete. How to use your cardiac monitor  Follow directions about how long to wear the monitor, and if you can take the monitor off in order to shower or bathe. ? Do not let the monitor get wet. ? Do not bathe, swim, or use a hot tub while wearing the monitor.  Keep your skin clean. Do not put body lotion or moisturizer on your chest.  Change the electrodes as told by your health care provider, or any time they stop sticking to your skin. You may need to use medical tape to keep them on.  Try to put the electrodes in slightly different places on your chest to help prevent skin irritation. Follow directions from your health care provider about where to place the electrodes.  Make sure the monitor is safely clipped to your clothing or in a location close to your body as recommended by your health care provider.  If your monitor has a symptoms button, press the button to mark an event as soon as you feel a heart-related symptom, such as: ? Dizziness. ? Weakness. ? Light-headedness. ? Palpitations. ? Thumping or pounding in your chest. ? Shortness of breath. ? Unexplained weakness.  Keep a diary of your activities, such as walking, doing chores, and taking medicine. It is very important to note what you were doing when you pushed the button to record your symptoms. This will help your health care provider determine what might be contributing to your symptoms.  Send the recorded information as recommended by your health care provider. It may take some time for your health care provider to process the results.  Change the batteries as told by your health care provider.  Keep electronic devices away from your monitor. These include: ? Tablets. ? MP3 players. ? Cell phones.  While wearing your monitor you should avoid: ? Electric blankets. ? Armed forces operational officer. ? Electric toothbrushes. ? Microwave ovens. ? Magnets. ? Metal detectors. Get help right away if:  You have chest pain.   You have shortness of breath or extreme difficulty breathing.  You develop a very fast heartbeat that does not get better.  You develop dizziness that does not go away.  You faint or constantly feel like you are about to faint. Summary  An ambulatory cardiac monitor is a small recording device that is used to detect abnormal heart rhythms (arrhythmias).  Make sure you understand how to send the information from the monitor to your health care provider.  It is important to press the button on the monitor when you have any heart-related symptoms.  Keep a diary of your activities, such as walking, doing chores, and taking medicine. It is very important to note what you were doing when you pushed the button to record your symptoms. This will help your health care provider learn what might be causing your symptoms. This information is not intended to replace advice given to you by your health care provider. Make sure you discuss any questions you have with your health care provider. Document Released: 06/13/2008 Document Revised: 08/17/2017 Document Reviewed: 08/19/2016 Elsevier Patient Education  [place].  Echocardiogram An echocardiogram is a procedure that uses painless sound waves (ultrasound) to produce an image of the heart. Images from an echocardiogram can provide important  information about:  Signs of coronary artery disease (CAD).  Aneurysm detection. An aneurysm is a weak or damaged part of an artery wall that bulges out from the normal force of blood pumping through the body.  Heart size and shape. Changes in the size or shape of the heart can be associated with certain conditions, including heart failure, aneurysm, and CAD.  Heart muscle function.  Heart valve function.  Signs of a past heart attack.  Fluid buildup around the heart.  Thickening of the heart muscle.  A tumor or infectious growth around the heart valves. Tell a health care provider about:   Any allergies you have.  All medicines you are taking, including vitamins, herbs, eye drops, creams, and over-the-counter medicines.  Any blood disorders you have.  Any surgeries you have had.  Any medical conditions you have.  Whether you are pregnant or may be pregnant. What are the risks? Generally, this is a safe procedure. However, problems may occur, including:  Allergic reaction to dye (contrast) that may be used during the procedure. What happens before the procedure? No specific preparation is needed. You may eat and drink normally. What happens during the procedure?   An IV tube may be inserted into one of your veins.  You may receive contrast through this tube. A contrast is an injection that improves the quality of the pictures from your heart.  A gel will be applied to your chest.  A wand-like tool (transducer) will be moved over your chest. The gel will help to transmit the sound waves from the transducer.  The sound waves will harmlessly bounce off of your heart to allow the heart images to be captured in real-time motion. The images will be recorded on a computer. The procedure may vary among health care providers and hospitals. What happens after the procedure?  You may return to your normal, everyday life, including diet, activities, and medicines, unless your health care provider tells you not to do that. Summary  An echocardiogram is a procedure that uses painless sound waves (ultrasound) to produce an image of the heart.  Images from an echocardiogram can provide important information about the size and shape of your heart, heart muscle function, heart valve function, and fluid buildup around your heart.  You do not need to do anything to prepare before this procedure. You may eat and drink normally.  After the echocardiogram is completed, you may return to your normal, everyday life, unless your health care provider tells you not to do that. This  information is not intended to replace advice given to you by your health care provider. Make sure you discuss any questions you have with your health care provider. Document Released: 09/01/2000 Document Revised: 12/26/2018 Document Reviewed: 10/07/2016 Elsevier Patient Education  2020 [employer].

## 2019-08-25 NOTE — Telephone Encounter (Signed)
[employer] to ship a 30 day cardiac event monitor to the patients home.  Instructions included in the monitor kit. 

## 2019-08-26 DIAGNOSIS — M9901 Segmental and somatic dysfunction of cervical region: Secondary | ICD-10-CM | POA: Diagnosis not present

## 2019-08-26 DIAGNOSIS — M542 Cervicalgia: Secondary | ICD-10-CM | POA: Diagnosis not present

## 2019-08-26 DIAGNOSIS — M4712 Other spondylosis with myelopathy, cervical region: Secondary | ICD-10-CM | POA: Diagnosis not present

## 2019-08-26 DIAGNOSIS — M9902 Segmental and somatic dysfunction of thoracic region: Secondary | ICD-10-CM | POA: Diagnosis not present

## 2019-08-28 ENCOUNTER — Ambulatory Visit (INDEPENDENT_AMBULATORY_CARE_PROVIDER_SITE_OTHER): Payer: Medicare HMO

## 2019-08-28 DIAGNOSIS — M9902 Segmental and somatic dysfunction of thoracic region: Secondary | ICD-10-CM | POA: Diagnosis not present

## 2019-08-28 DIAGNOSIS — M542 Cervicalgia: Secondary | ICD-10-CM | POA: Diagnosis not present

## 2019-08-28 DIAGNOSIS — M9901 Segmental and somatic dysfunction of cervical region: Secondary | ICD-10-CM | POA: Diagnosis not present

## 2019-08-28 DIAGNOSIS — M4712 Other spondylosis with myelopathy, cervical region: Secondary | ICD-10-CM | POA: Diagnosis not present

## 2019-08-28 DIAGNOSIS — R55 Syncope and collapse: Secondary | ICD-10-CM

## 2019-08-28 NOTE — Telephone Encounter (Signed)
° ° °  Patient requesting call for additional instructions

## 2019-08-28 NOTE — Telephone Encounter (Signed)
Patient called the monitor company and they walked her through the process of starting the monitor over the phone.

## 2019-09-01 ENCOUNTER — Ambulatory Visit (HOSPITAL_COMMUNITY): Payer: Medicare HMO | Attending: Internal Medicine

## 2019-09-01 ENCOUNTER — Other Ambulatory Visit: Payer: Self-pay

## 2019-09-01 DIAGNOSIS — R55 Syncope and collapse: Secondary | ICD-10-CM | POA: Diagnosis not present

## 2019-09-02 ENCOUNTER — Other Ambulatory Visit: Payer: Self-pay | Admitting: Psychiatry

## 2019-09-02 NOTE — Telephone Encounter (Signed)
Apt tomorrow 12/16, didn't want to send in yet in case of any changes

## 2019-09-03 ENCOUNTER — Ambulatory Visit (INDEPENDENT_AMBULATORY_CARE_PROVIDER_SITE_OTHER): Payer: Medicare HMO | Admitting: Psychiatry

## 2019-09-03 ENCOUNTER — Encounter: Payer: Self-pay | Admitting: Psychiatry

## 2019-09-03 DIAGNOSIS — M542 Cervicalgia: Secondary | ICD-10-CM | POA: Diagnosis not present

## 2019-09-03 DIAGNOSIS — F4310 Post-traumatic stress disorder, unspecified: Secondary | ICD-10-CM

## 2019-09-03 DIAGNOSIS — M9901 Segmental and somatic dysfunction of cervical region: Secondary | ICD-10-CM | POA: Diagnosis not present

## 2019-09-03 DIAGNOSIS — F3181 Bipolar II disorder: Secondary | ICD-10-CM | POA: Diagnosis not present

## 2019-09-03 DIAGNOSIS — M9902 Segmental and somatic dysfunction of thoracic region: Secondary | ICD-10-CM | POA: Diagnosis not present

## 2019-09-03 DIAGNOSIS — F4001 Agoraphobia with panic disorder: Secondary | ICD-10-CM

## 2019-09-03 DIAGNOSIS — R69 Illness, unspecified: Secondary | ICD-10-CM | POA: Diagnosis not present

## 2019-09-03 DIAGNOSIS — M4712 Other spondylosis with myelopathy, cervical region: Secondary | ICD-10-CM | POA: Diagnosis not present

## 2019-09-03 MED ORDER — CITALOPRAM HYDROBROMIDE 20 MG PO TABS: 20.0000 mg | ORAL_TABLET | Freq: Every day | ORAL | 1 refills | Status: DC

## 2019-09-03 MED ORDER — LURASIDONE HCL 120 MG PO TABS: 120.0000 mg | ORAL_TABLET | Freq: Every day | ORAL | 5 refills | Status: DC

## 2019-09-03 MED ORDER — ALPRAZOLAM 0.5 MG PO TABS: 0.5000 mg | ORAL_TABLET | Freq: Four times a day (QID) | ORAL | 5 refills | Status: DC

## 2019-09-03 NOTE — Progress Notes (Signed)
[name] [id number] 1955-12-28 63 y.o.  Virtual Visit via [facility]  I connected with pt by WebEx and verified that I am speaking with the correct person using two identifiers.   I discussed the limitations, risks, security and privacy concerns of performing an evaluation and management service by Jackquline Denmark and the availability of in person appointments. I also discussed with the patient that there may be a patient responsible charge related to this service. The patient expressed understanding and agreed to proceed.  I discussed the assessment and treatment plan with the patient. The patient was provided an opportunity to ask questions and all were answered. The patient agreed with the plan and demonstrated an understanding of the instructions.   The patient was advised to call back or seek an in-person evaluation if the symptoms worsen or if the condition fails to improve as anticipated.  I provided 30 minutes of video time during this encounter. The call started at 1200 and ended at 12:30. The patient was located at home and the provider was located office.  Subjective:   Patient ID:  [name] is a 63 y.o. (DOB 03/18/1956) female.  Chief Complaint:  Chief Complaint  Patient presents with  . Follow-up    Medication Management  . Other    Bipolar 2  . Stress    blackout unexplained    HPI [name] presents to the office today for follow-up of bipolar 2, PTSD.  Last seen March 12, 2019.  She wanted to reduce the citalopram to 20 mg and that seemed reasonable..  No other meds were changed  Had blackout end of November and is on a heart monitor right now.  Dr. Margaretann Loveless, [facility] cardiologist. Bicuspid aortic valve.  Not sure if it's related.  No known cause at this time.  FU 2022-10-12.    Friend with Sepsis died.  Misses her.  Hard bc friends for 50+ years.  Caused her some anxiety too. Son still acting crazy.  GS facing court for felony larceny and hasn't seen him in 4 years.   Don't know what to do with them.  Patient reports stable mood and denies depressed or irritable moods.  Patient has some recent difficulty with anxiety.  Patient denies difficulty with sleep initiation or maintenance. Denies appetite disturbance.  Patient reports that energy and motivation have been good.  Patient denies any difficulty with concentration.  Patient denies any suicidal ideation.  In a prayer chain and prays daily to help herself and others.  Asks to reduce citalopram to 20.  Past Psychiatric Medication Trials: Latuda 120, citalopram 30, N-acetylcysteine, Topamax 150, Latuda 120, Xanax, sertraline more anxiety, Saphris 20, mirtazapine 30, clonidine, Seroquel, Depakote, duloxetine, Wellbutrin, paroxetine, fluoxetine, Symbyax, Ambien, gabapentin no response for pain, Lyrica no response, trazodone no response, hydroxyzine headache, lamotrigine rash, nortriptyline, Flexeril, Abilify, lithium weight gain, diazepam Under our care since March 2007  Review of Systems:  Review of Systems  Respiratory: Positive for shortness of breath.   Neurological: Negative for tremors and weakness.    Medications: I have reviewed the patient's current medications.  Current Outpatient Medications  Medication Sig Dispense Refill  . Acetylcysteine (N-ACETYL-L-CYSTEINE) 600 MG CAPS Take 600 mg by mouth daily.    Marland Kitchen albuterol (PROVENTIL HFA;VENTOLIN HFA) 108 (90 BASE) MCG/ACT inhaler Inhale 1 puff into the lungs every 6 (six) hours as needed for wheezing or shortness of breath.    . ALPRAZolam (XANAX) 0.5 MG tablet Take 1 tablet (0.5 mg total)  by mouth 4 (four) times daily. 120 tablet 5  . citalopram (CELEXA) 20 MG tablet Take 1.5 tablets (30 mg total) by mouth daily. 135 tablet 1  . denosumab (PROLIA) 60 MG/ML SOLN injection Inject 60 mg into the skin every 6 (six) months. Administer in upper arm, thigh, or abdomen    . dexlansoprazole (DEXILANT) 60 MG capsule Take 60 mg by mouth daily.    . Lurasidone  HCl 120 MG TABS Take 120 mg by mouth daily.    Marland Kitchen omeprazole (PRILOSEC) 40 MG capsule Take 40 mg by mouth daily.    Marland Kitchen topiramate (TOPAMAX) 100 MG tablet TAKE 1 & 1/2 TABLETS BY MOUTH DAILY 135 tablet 1  . Vitamin D, Ergocalciferol, (DRISDOL) 50000 UNITS CAPS capsule Take 50,000 Units by mouth every 7 (seven) days.    Marland Kitchen levothyroxine (SYNTHROID, LEVOTHROID) 75 MCG tablet Take 88 mcg by mouth daily before breakfast.      No current facility-administered medications for this visit.    Medication Side Effects: None  Allergies:  Allergies  Allergen Reactions  . Alendronate Sodium Shortness Of Breath  . Fiorinal [Butalbital-Aspirin-Caffeine]     SWELLING OF LIPS AND HIVES  . Iohexol      Code: HIVES, Desc: omnipaque 300-doc'd by KB on 04-13-06.Marland KitchenMarland Kitchenpt states she gets severe hives   . Klonopin [Clonazepam]     PT STATES SHE OVERDOSED ON KLONOPIN  . Other     THE PURPLE DYE IN GENERIC SYNTHROID ( LEVOTHYROXINE ) CAUSED BREAKING OUT FACE AND SCALP.  PT CAN TAKE SYNTHROID.  Marland Kitchen Tramadol Other (See Comments)    depression  . Dexilant [Dexlansoprazole] Diarrhea    Past Medical History:  Diagnosis Date  . Bipolar disorder (HCC)    PT ON TOPIRAMATE AND ZANAX- SEES PSYCHIATRIST DR. COTTLE - PT STATES DOING WELL ON MEDICATIONS  . COPD (chronic obstructive pulmonary disease) ([place])    OCCAS USE OF INHALER-QUIT SMOKING 2014 - USING E- CIGARETTE  . Diverticulitis   . GERD (gastroesophageal reflux disease)   . H/O suicide attempt 2007   PT'S SON HAD COMMITTED SUICIDE AND SHE WAS OVER COME WITH GRIEF  . Hypothyroidism   . IBS (irritable bowel syndrome)   . Lichen simplex chronicus   . MVP (mitral valve prolapse)    DOES NOT CAUSE ANY PROBLEMS  . Osteoporosis   . Pain    RIGHT SHOULDER AND LIMITED ROM - -RT SHOULDER IMPINGEMENT, ADHESIVE CAPSULITIS, ROTATOR CUFF TEAR  . Pain    NECK PAIN -PT HOPING SURGERY ON HER SHOULDER HELPS HER NECK PAIN- DOES HAVE HX OF CERBVICAL DISK FUSION.  . Pain     LUMBAR PAIN - DDD AND PREVIOUS LUMBAR SURGERY  . Vitamin D deficiency     Family History  Problem Relation Age of Onset  . Heart failure Mother   . Heart disease Mother   . Stroke Mother   . Heart disease Father   . Heart attack Paternal Grandmother     Social History   Socioeconomic History  . Marital status: Married    Spouse name: Not on file  . Number of children: Not on file  . Years of education: Not on file  . Highest education level: Not on file  Occupational History  . Not on file  Tobacco Use  . Smoking status: Former Smoker    Packs/day: 1.00    Years: 40.00    Pack years: 40.00    Types: Cigarettes    Quit date: 10/06/2012  Years since quitting: 6.9  . Smokeless tobacco: Current User  Substance and Sexual Activity  . Alcohol use: No    Comment: SMOKING E- CIGARETTES  . Drug use: No  . Sexual activity: Not on file  Other Topics Concern  . Not on file  Social History Narrative  . Not on file   Social Determinants of Health   Financial Resource Strain:   . Difficulty of Paying Living Expenses: Not on file  Food Insecurity:   . Worried About Charity fundraiser in the Last Year: Not on file  . Ran Out of Food in the Last Year: Not on file  Transportation Needs:   . Lack of Transportation (Medical): Not on file  . Lack of Transportation (Non-Medical): Not on file  Physical Activity:   . Days of Exercise per Week: Not on file  . Minutes of Exercise per Session: Not on file  Stress:   . Feeling of Stress : Not on file  Social Connections:   . Frequency of Communication with Friends and Family: Not on file  . Frequency of Social Gatherings with Friends and Family: Not on file  . Attends Religious Services: Not on file  . Active Member of Clubs or Organizations: Not on file  . Attends Archivist Meetings: Not on file  . Marital Status: Not on file  Intimate Partner Violence:   . Fear of Current or Ex-Partner: Not on file  . Emotionally  Abused: Not on file  . Physically Abused: Not on file  . Sexually Abused: Not on file    Past Medical History, Surgical history, Social history, and Family history were reviewed and updated as appropriate.   Please see review of systems for further details on the patient's review from today.   Objective:   Physical Exam:  There were no vitals taken for this visit.  Physical Exam Neurological:     Mental Status: She is alert and oriented to person, place, and time.     Cranial Nerves: No dysarthria.  Psychiatric:        Attention and Perception: Attention and perception normal.        Mood and Affect: Mood is anxious.        Speech: Speech normal.        Behavior: Behavior is cooperative.        Thought Content: Thought content normal. Thought content is not paranoid or delusional. Thought content does not include homicidal or suicidal ideation. Thought content does not include homicidal or suicidal plan.        Cognition and Memory: Cognition and memory normal.        Judgment: Judgment normal.     Comments: Insight intact Anxiety over health specifically a blackout that was unexplained.     Lab Review:     Component Value Date/Time   NA 141 02/11/2014 1045   K 4.5 02/11/2014 1045   CL 105 02/11/2014 1045   CO2 24 02/11/2014 1045   GLUCOSE 97 02/11/2014 1045   BUN 9 02/11/2014 1045   CREATININE 0.71 02/11/2014 1045   CALCIUM 9.6 02/11/2014 1045   PROT 7.2 01/28/2011 1626   ALBUMIN 4.1 01/28/2011 1626   AST 13 01/28/2011 1626   ALT 10 01/28/2011 1626   ALKPHOS 93 01/28/2011 1626   BILITOT 0.8 01/28/2011 1626   GFRNONAA >90 02/11/2014 1045   GFRAA >90 02/11/2014 1045       Component Value Date/Time   WBC 5.4 02/11/2014  1045   RBC 4.73 02/11/2014 1045   HGB 14.1 02/11/2014 1045   HCT 41.8 02/11/2014 1045   PLT 249 02/11/2014 1045   MCV 88.4 02/11/2014 1045   MCH 29.8 02/11/2014 1045   MCHC 33.7 02/11/2014 1045   RDW 13.2 02/11/2014 1045   LYMPHSABS 3.4  01/28/2011 1626   MONOABS 1.2 (H) 01/28/2011 1626   EOSABS 0.1 01/28/2011 1626   BASOSABS 0.0 01/28/2011 1626    No results found for: POCLITH, LITHIUM   No results found for: PHENYTOIN, PHENOBARB, VALPROATE, CBMZ   .res Assessment: Plan:    Jeneffer was seen today for follow-up, other and stress.  Diagnoses and all orders for this visit:  Bipolar II disorder ([place])  PTSD (post-traumatic stress disorder)  Panic disorder with agoraphobia   Supportive therapy dealing with Covid and son with addiction and her codependence.    As can been seen above the patient has had treatment resistant bipolar disorder plus PTSD and panic with multiple failed medications with pretty good response to Latuda 120, citalopram 30, Xanax 0.5 4 times daily.  We discussed the short-term risks associated with benzodiazepines including sedation and increased fall risk among others.  Discussed long-term side effect risk including dependence, potential withdrawal symptoms, and the potential eventual dose-related risk of dementia. Would have panic without Xanax.  Discussed potential metabolic side effects associated with atypical antipsychotics, as well as potential risk for movement side effects. Advised pt to contact office if movement side effects occur.  No evidence for TD.  No med changes indicated.  Done well with reduction in  citalopram to 20 mg daily.  That seems reasonable.  This may actually reduce the risk of mood cycling because SSRIs can induce mood cycling and bipolar patients.  Discussed the risks and let us know if she has any worsening depression or anxiety.  FU 6 mos  Lynder Parents, MD, DFAPA   Please see After Visit Summary for patient specific instructions.  Future Appointments  Date Time Provider [facility]  10/08/2019 10:00 AM Elouise Munroe, MD [facility]    No orders of the defined types were placed in this encounter.   -------------------------------

## 2019-09-04 DIAGNOSIS — M542 Cervicalgia: Secondary | ICD-10-CM | POA: Diagnosis not present

## 2019-09-04 DIAGNOSIS — M9901 Segmental and somatic dysfunction of cervical region: Secondary | ICD-10-CM | POA: Diagnosis not present

## 2019-09-04 DIAGNOSIS — M4712 Other spondylosis with myelopathy, cervical region: Secondary | ICD-10-CM | POA: Diagnosis not present

## 2019-09-04 DIAGNOSIS — M9902 Segmental and somatic dysfunction of thoracic region: Secondary | ICD-10-CM | POA: Diagnosis not present

## 2019-09-09 DIAGNOSIS — M542 Cervicalgia: Secondary | ICD-10-CM | POA: Diagnosis not present

## 2019-09-09 DIAGNOSIS — M9902 Segmental and somatic dysfunction of thoracic region: Secondary | ICD-10-CM | POA: Diagnosis not present

## 2019-09-09 DIAGNOSIS — M9901 Segmental and somatic dysfunction of cervical region: Secondary | ICD-10-CM | POA: Diagnosis not present

## 2019-09-09 DIAGNOSIS — M4712 Other spondylosis with myelopathy, cervical region: Secondary | ICD-10-CM | POA: Diagnosis not present

## 2019-09-16 DIAGNOSIS — M9902 Segmental and somatic dysfunction of thoracic region: Secondary | ICD-10-CM | POA: Diagnosis not present

## 2019-09-16 DIAGNOSIS — M4712 Other spondylosis with myelopathy, cervical region: Secondary | ICD-10-CM | POA: Diagnosis not present

## 2019-09-16 DIAGNOSIS — M9901 Segmental and somatic dysfunction of cervical region: Secondary | ICD-10-CM | POA: Diagnosis not present

## 2019-09-16 DIAGNOSIS — M542 Cervicalgia: Secondary | ICD-10-CM | POA: Diagnosis not present

## 2019-09-18 DIAGNOSIS — M9901 Segmental and somatic dysfunction of cervical region: Secondary | ICD-10-CM | POA: Diagnosis not present

## 2019-09-18 DIAGNOSIS — M542 Cervicalgia: Secondary | ICD-10-CM | POA: Diagnosis not present

## 2019-09-18 DIAGNOSIS — M9902 Segmental and somatic dysfunction of thoracic region: Secondary | ICD-10-CM | POA: Diagnosis not present

## 2019-09-18 DIAGNOSIS — M4712 Other spondylosis with myelopathy, cervical region: Secondary | ICD-10-CM | POA: Diagnosis not present

## 2019-09-22 DIAGNOSIS — M542 Cervicalgia: Secondary | ICD-10-CM | POA: Diagnosis not present

## 2019-09-22 DIAGNOSIS — M4712 Other spondylosis with myelopathy, cervical region: Secondary | ICD-10-CM | POA: Diagnosis not present

## 2019-09-22 DIAGNOSIS — M9902 Segmental and somatic dysfunction of thoracic region: Secondary | ICD-10-CM | POA: Diagnosis not present

## 2019-09-22 DIAGNOSIS — M9901 Segmental and somatic dysfunction of cervical region: Secondary | ICD-10-CM | POA: Diagnosis not present

## 2019-10-07 ENCOUNTER — Other Ambulatory Visit: Payer: Self-pay | Admitting: Psychiatry

## 2019-10-07 DIAGNOSIS — F4310 Post-traumatic stress disorder, unspecified: Secondary | ICD-10-CM

## 2019-10-07 NOTE — Progress Notes (Addendum)
Cardiology Office Note:    Date:  10/08/2019   ID:  [name], DOB 24-May-1956, MRN [id number]  PCP:  [name], MD  Cardiologist:  No primary care provider on file.  Electrophysiologist:  None   Referring MD: [name], MD   Chief Complaint: Follow-up testing for syncope  History of Present Illness:    [name] is a 64 y.o. 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  She is a previous patient of Dr. Tollie Eth.  She presents today after a syncopal episode on November 14.  She tells me she was shopping at [facility] and when she went out into the parking lot she blacked out she does not remember having a prodrome she was quite dizzy.  She remembers reaching for her shopping cart after she awoke.  She has never had syncope in her lifetime prior to this.  She has had episodes of presyncope approximately every 6 months.  She did not present to the hospital after her syncopal episode.    She presents today to review testing.  She has had no interval events since our last visit.  She overall feels well and has no specific concerns.  She is relieved to hear that her testing is reassuring.  No acute findings on event monitor.   No acute findings on echocardiogram, we discussed that her bicuspid aortic valve is not well visualized and on next year's echocardiogram it is imperative we get a good look at this morphology.  In addition she has a mildly dilated aorta 39 mm, which for her BSA may represent mild dilatation..  No cross-sectional imaging of the aorta is available for review, and I am unable to see the images from Dr. Thurman Coyer echocardiogram in 2019.  He notes that the aortic root is 32 mm, and on our more recent exam it is documented at 38 mm with a 39 mm a sending aorta.  This warrants further investigation. Past Medical History:  Diagnosis Date  . Bipolar disorder (HCC)    PT ON TOPIRAMATE AND  ZANAX- SEES PSYCHIATRIST DR. COTTLE - PT STATES DOING WELL ON MEDICATIONS  . COPD (chronic obstructive pulmonary disease) ([place])    OCCAS USE OF INHALER-QUIT SMOKING 2014 - USING E- CIGARETTE  . Diverticulitis   . GERD (gastroesophageal reflux disease)   . H/O suicide attempt 2007   PT'S SON HAD COMMITTED SUICIDE AND SHE WAS OVER COME WITH GRIEF  . Hypothyroidism   . IBS (irritable bowel syndrome)   . Lichen simplex chronicus   . MVP (mitral valve prolapse)    DOES NOT CAUSE ANY PROBLEMS  . Osteoporosis   . Pain    RIGHT SHOULDER AND LIMITED ROM - -RT SHOULDER IMPINGEMENT, ADHESIVE CAPSULITIS, ROTATOR CUFF TEAR  . Pain    NECK PAIN -PT HOPING SURGERY ON HER SHOULDER HELPS HER NECK PAIN- DOES HAVE HX OF CERBVICAL DISK FUSION.  . Pain    LUMBAR PAIN - DDD AND PREVIOUS LUMBAR SURGERY  . Vitamin D deficiency     Past Surgical History:  Procedure Laterality Date  . ABDOMINAL HYSTERECTOMY  1975  . BACK SURGERY     LUMBAR SURGERY FOR HNP  . BREAST SURGERY     BREAST REDUCTION  . CERVICAL DISK FUSION    . LEFT OOPHORECTOMY  1994  . SHOULDER ARTHROSCOPY WITH SUBACROMIAL DECOMPRESSION AND OPEN ROTATOR C Right 02/19/2014   Procedure: RIGHT SHOULDER ARTHROSCOPY  SUBACROMIAL DECOMPRESSION EVALUATION UNDER ANESTHESIA AND  MANIPULATION UNDER ANESTHIESIA DEBRIDEMENT OF PARTIAL ROTATOR CUFF;  Surgeon: Johnn Hai, MD;  Location: [facility];  Service: Orthopedics;  Laterality: Right;    Current Medications: Current Meds  Medication Sig  . Acetylcysteine (N-ACETYL-L-CYSTEINE) 600 MG CAPS Take 600 mg by mouth daily.  Marland Kitchen albuterol (PROVENTIL HFA;VENTOLIN HFA) 108 (90 BASE) MCG/ACT inhaler Inhale 1 puff into the lungs every 6 (six) hours as needed for wheezing or shortness of breath.  . ALPRAZolam (XANAX) 0.5 MG tablet Take 1 tablet (0.5 mg total) by mouth 4 (four) times daily.  . citalopram (CELEXA) 20 MG tablet Take 1 tablet (20 mg total) by mouth daily.  Marland Kitchen denosumab (PROLIA) 60 MG/ML SOLN injection  Inject 60 mg into the skin every 6 (six) months. Administer in upper arm, thigh, or abdomen  . dexlansoprazole (DEXILANT) 60 MG capsule Take 60 mg by mouth daily.  Marland Kitchen levothyroxine (SYNTHROID, LEVOTHROID) 75 MCG tablet Take 88 mcg by mouth daily before breakfast.   . Lurasidone HCl 120 MG TABS Take 1 tablet (120 mg total) by mouth daily.  Marland Kitchen omeprazole (PRILOSEC) 40 MG capsule Take 40 mg by mouth daily.  Marland Kitchen topiramate (TOPAMAX) 100 MG tablet TAKE 1 AND 1/2 TABLETS BY MOUTH DAILY  . Vitamin D, Ergocalciferol, (DRISDOL) 50000 UNITS CAPS capsule Take 50,000 Units by mouth every 7 (seven) days.     Allergies:   Alendronate sodium, Fiorinal [butalbital-aspirin-caffeine], Iohexol, Klonopin [clonazepam], Other, Tramadol, and Dexilant [dexlansoprazole]   Social History   Socioeconomic History  . Marital status: Married    Spouse name: Not on file  . Number of children: Not on file  . Years of education: Not on file  . Highest education level: Not on file  Occupational History  . Not on file  Tobacco Use  . Smoking status: Former Smoker    Packs/day: 1.00    Years: 40.00    Pack years: 40.00    Types: Cigarettes    Quit date: 10/06/2012    Years since quitting: 7.0  . Smokeless tobacco: Current User  Substance and Sexual Activity  . Alcohol use: No    Comment: SMOKING E- CIGARETTES  . Drug use: No  . Sexual activity: Not on file  Other Topics Concern  . Not on file  Social History Narrative  . Not on file   Social Determinants of Health   Financial Resource Strain:   . Difficulty of Paying Living Expenses: Not on file  Food Insecurity:   . Worried About Charity fundraiser in the Last Year: Not on file  . Ran Out of Food in the Last Year: Not on file  Transportation Needs:   . Lack of Transportation (Medical): Not on file  . Lack of Transportation (Non-Medical): Not on file  Physical Activity:   . Days of Exercise per Week: Not on file  . Minutes of Exercise per Session: Not on  file  Stress:   . Feeling of Stress : Not on file  Social Connections:   . Frequency of Communication with Friends and Family: Not on file  . Frequency of Social Gatherings with Friends and Family: Not on file  . Attends Religious Services: Not on file  . Active Member of Clubs or Organizations: Not on file  . Attends Archivist Meetings: Not on file  . Marital Status: Not on file     Family History: The patient's family history includes Heart attack in her paternal grandmother;  Heart disease in her father and mother; Heart failure in her mother; Stroke in her mother.  ROS:   Please see the history of present illness.    All other systems reviewed and are negative.  EKGs/Labs/Other Studies Reviewed:    The following studies were reviewed today:  EKG: Not performed today 08/25/2019-normal sinus rhythm low voltage QRS, possible septal infarct.  Echo 09/01/2019:  1. Left ventricular ejection fraction, by visual estimation, is 60 to 65%. The left ventricle has normal function. There is no left ventricular hypertrophy.  2. The left ventricle has no regional wall motion abnormalities.  3. Global right ventricle has normal systolic function.The right ventricular size is normal. No increase in right ventricular wall thickness.  4. Left atrial size was normal.  5. Right atrial size was normal.  6. The mitral valve is normal in structure. Mild mitral valve regurgitation.  7. The tricuspid valve is normal in structure.  8. The aortic valve has an indeterminant number of cusps. Aortic valve regurgitation is not visualized. Mild aortic valve stenosis.  9. The pulmonic valve was normal in structure. Pulmonic valve regurgitation is not visualized. 10. Aortic dilatation noted. 11. There is mild to moderate dilatation of the ascending aorta measuring 39 mm. 12. Normal pulmonary artery systolic pressure. 13. The atrial septum is grossly normal. 14. The average left ventricular global  longitudinal strain is -18.1 %.   Recent Labs: No results found for requested labs within last 8760 hours.  Recent Lipid Panel No results found for: CHOL, TRIG, HDL, CHOLHDL, VLDL, LDLCALC, LDLDIRECT  Physical Exam:    VS:  BP 116/70   Pulse 71   Temp (!) 96.6 F (35.9 C)   Ht 5\' 7"  (1.702 m)   Wt 124 lb 9.6 oz (56.5 kg)   SpO2 99%   BMI 19.52 kg/m     Wt Readings from Last 5 Encounters:  10/08/19 124 lb 9.6 oz (56.5 kg)  08/25/19 126 lb (57.2 kg)  02/11/14 143 lb (64.9 kg)     Constitutional: No acute distress Eyes: sclera non-icteric, normal conjunctiva and lids ENMT: normal dentition, moist mucous membranes Cardiovascular: regular rhythm, normal rate, 1/6 systolic murmur. S1 and S2 normal. Radial pulses normal bilaterally. No jugular venous distention.  Respiratory: clear to auscultation bilaterally GI : normal bowel sounds, soft and nontender. No distention.   MSK: extremities warm, well perfused. No edema.  NEURO: grossly nonfocal exam, moves all extremities. PSYCH: alert and oriented x 3, normal mood and affect.   ASSESSMENT:    1. Syncope, unspecified syncope type   2. Bicuspid aortic valve   3. Chronic obstructive pulmonary disease, unspecified COPD type ([place])   4. Gastroesophageal reflux disease, unspecified whether esophagitis present   5. Hypothyroidism, unspecified type   6. Hyperlipidemia, unspecified hyperlipidemia type   7. Ascending aorta dilatation (HCC)    PLAN:    1.  Syncope-no worrisome findings on cardiac event monitor or echocardiogram to suggest an etiology for syncope.  She does have a mildly dilated aorta.  It would be unusual for this to cause syncope, however in the setting of her bicuspid aortic valve, it would be best to image her aorta given known association with aortopathy.  We will perform a CT angiogram aorta for completeness of work-up given a mildly dilated aorta.  2.  Bicuspid aortic valve and mild ascending aorta dilatation-we  will obtain CT angiography of the aorta to fully screen her thoracic aorta in the setting of a potential change  between an echocardiogram of 2019 to now.  We will also repeat an echocardiogram in 1 year to follow her bicuspid aortic valve mild stenosis.  3.  Hyperlipidemia-last lipid panel demonstrates an LDL of 112 from 2019 November and otherwise optimize lipid panel.  Patient takes atorvastatin 20 mg daily which is not on her medication list today, continue to follow lipids with her primary care physician.   Total time of encounter: 40 minutes total time of encounter, including 20 minutes spent in face-to-face patient care. This time includes coordination of care and counseling regarding bicuspid aortic valve, aortopathy, syncope. Remainder of non-face-to-face time involved reviewing chart documents/testing relevant to the patient encounter and documentation in the medical record.  Cherlynn Kaiser, MD [place]  [facility]   Medication Adjustments/Labs and Tests Ordered: Current medicines are reviewed at length with the patient today.  Concerns regarding medicines are outlined above.  Orders Placed This Encounter  Procedures  . ECHOCARDIOGRAM COMPLETE   No orders of the defined types were placed in this encounter.   Patient Instructions  Medication Instructions:  None ordered. *If you need a refill on your cardiac medications before your next appointment, please call your pharmacy*  Lab Work: None ordered If you have labs (blood work) drawn today and your tests are completely normal, you will receive your results only by: Marland Kitchen MyChart Message (if you have MyChart) OR . A paper copy in the mail If you have any lab test that is abnormal or we need to change your treatment, we will call you to review the results.  Testing/Procedures: Your physician has requested that you have an echocardiogram in 12 months. Echocardiography is a painless test that uses sound waves to create  images of your heart. It provides your doctor with information about the size and shape of your heart and how well your heart's chambers and valves are working. You may receive an ultrasound enhancing agent through an IV if needed to better visualize your heart during the echo.This procedure takes approximately one hour. There are no restrictions for this procedure. This will take place at the [address].    Follow-Up: At [facility], you and your health needs are our priority.  As part of our continuing mission to provide you with exceptional heart care, we have created designated Provider Care Teams.  These Care Teams include your primary Cardiologist (physician) and Advanced Practice Providers (APPs -  Physician Assistants and Nurse Practitioners) who all work together to provide you with the care you need, when you need it.  Your next appointment:   6 month(s)  The format for your next appointment:   In Person  Provider:   Cherlynn Kaiser, MD  Other Instructions Do not drive for 6 months. Syncope Syncope is when you pass out (faint) for a short time. It is caused by a sudden decrease in blood flow to the brain. Signs that you may be about to pass out include:  Feeling dizzy or light-headed.  Feeling sick to your stomach (nauseous).  Seeing all white or all black.  Having cold, clammy skin. If you pass out, get help right away. Call your local emergency services (911 in the [place] Do not drive yourself to the hospital. Follow these instructions at home: Watch for any changes in your symptoms. Take these actions to stay safe and help with your symptoms: Lifestyle  Do not drive, use machinery, or play sports until your doctor says it is okay.  Do not drink alcohol.  Do not use any products that contain nicotine or tobacco, such as cigarettes and e-cigarettes. If you need help quitting, ask your doctor.  Drink enough fluid to keep your pee (urine) pale  yellow. General instructions  Take over-the-counter and prescription medicines only as told by your doctor.  If you are taking blood pressure or heart medicine, sit up and stand up slowly. Spend a few minutes getting ready to sit and then stand. This can help you feel less dizzy.  Have someone stay with you until you feel stable.  If you start to feel like you might pass out, lie down right away and raise (elevate) your feet above the level of your heart. Breathe deeply and steadily. Wait until all of the symptoms are gone.  Keep all follow-up visits as told by your doctor. This is important. Get help right away if:  You have a very bad headache.  You pass out once or more than once.  You have pain in your chest, belly, or back.  You have a very fast or uneven heartbeat (palpitations).  It hurts to breathe.  You are bleeding from your mouth or your bottom (rectum).  You have black or tarry poop (stool).  You have jerky movements that you cannot control (seizure).  You are confused.  You have trouble walking.  You are very weak.  You have vision problems. These symptoms may be an emergency. Do not wait to see if the symptoms will go away. Get medical help right away. Call your local emergency services (911 in the [place] Do not drive yourself to the hospital. Summary  Syncope is when you pass out (faint) for a short time. It is caused by a sudden decrease in blood flow to the brain.  Signs that you may be about to faint include feeling dizzy, light-headed, or sick to your stomach, seeing all white or all black, or having cold, clammy skin.  If you start to feel like you might pass out, lie down right away and raise (elevate) your feet above the level of your heart. Breathe deeply and steadily. Wait until all of the symptoms are gone. This information is not intended to replace advice given to you by your health care provider. Make sure you discuss any questions you have  with your health care provider. Document Revised: 10/17/2017 Document Reviewed: 10/17/2017 Elsevier Patient Education  2020 [employer].

## 2019-10-08 ENCOUNTER — Encounter: Payer: Self-pay | Admitting: Internal Medicine

## 2019-10-08 ENCOUNTER — Telehealth: Payer: Self-pay | Admitting: *Deleted

## 2019-10-08 ENCOUNTER — Other Ambulatory Visit: Payer: Self-pay

## 2019-10-08 ENCOUNTER — Ambulatory Visit: Payer: Medicare HMO | Admitting: Internal Medicine

## 2019-10-08 VITALS — BP 116/70 | HR 71 | Temp 96.6°F | Ht 67.0 in | Wt 124.6 lb

## 2019-10-08 DIAGNOSIS — K219 Gastro-esophageal reflux disease without esophagitis: Secondary | ICD-10-CM | POA: Diagnosis not present

## 2019-10-08 DIAGNOSIS — Q231 Congenital insufficiency of aortic valve: Secondary | ICD-10-CM | POA: Diagnosis not present

## 2019-10-08 DIAGNOSIS — R55 Syncope and collapse: Secondary | ICD-10-CM | POA: Diagnosis not present

## 2019-10-08 DIAGNOSIS — Z01818 Encounter for other preprocedural examination: Secondary | ICD-10-CM

## 2019-10-08 DIAGNOSIS — E785 Hyperlipidemia, unspecified: Secondary | ICD-10-CM | POA: Diagnosis not present

## 2019-10-08 DIAGNOSIS — J449 Chronic obstructive pulmonary disease, unspecified: Secondary | ICD-10-CM

## 2019-10-08 DIAGNOSIS — I77810 Thoracic aortic ectasia: Secondary | ICD-10-CM | POA: Diagnosis not present

## 2019-10-08 DIAGNOSIS — E039 Hypothyroidism, unspecified: Secondary | ICD-10-CM | POA: Diagnosis not present

## 2019-10-08 MED ORDER — PREDNISONE 50 MG PO TABS: ORAL_TABLET | ORAL | 0 refills | Status: DC

## 2019-10-08 MED ORDER — DIPHENHYDRAMINE HCL 50 MG PO TABS: ORAL_TABLET | ORAL | 0 refills | Status: DC

## 2019-10-08 NOTE — Telephone Encounter (Signed)
Spoke with patient regarding appointment for CTA chest aorta scheduled 10/22/19 at 3:30pm at [facility]---arrival time 3:15pm 1st floor admissions office---liquids only 4 hours prior to study---patient to come in 10/14/19 for BMET

## 2019-10-08 NOTE — Telephone Encounter (Signed)
Per Dr. Alveda Reasons, she would like for the patient to have a CT angio of the Aorta. The patient does have a dye allergy (she gets hives). She has been advised of the instructions for the dye allergy and that someone from scheduling will call her to set this up.   Prednisone 50 mg-13 hours, 7 hours and 1 hour prior. Benadryl 50 mg- one hour prior.

## 2019-10-08 NOTE — Patient Instructions (Signed)
Medication Instructions:  None ordered. *If you need a refill on your cardiac medications before your next appointment, please call your pharmacy*  Lab Work: None ordered If you have labs (blood work) drawn today and your tests are completely normal, you will receive your results only by: Marland Kitchen MyChart Message (if you have MyChart) OR . A paper copy in the mail If you have any lab test that is abnormal or we need to change your treatment, we will call you to review the results.  Testing/Procedures: Your physician has requested that you have an echocardiogram in 12 months. Echocardiography is a painless test that uses sound waves to create images of your heart. It provides your doctor with information about the size and shape of your heart and how well your heart's chambers and valves are working. You may receive an ultrasound enhancing agent through an IV if needed to better visualize your heart during the echo.This procedure takes approximately one hour. There are no restrictions for this procedure. This will take place at the [address], [address].    Follow-Up: At [facility], you and your health needs are our priority.  As part of our continuing mission to provide you with exceptional heart care, we have created designated Provider Care Teams.  These Care Teams include your primary Cardiologist (physician) and Advanced Practice Providers (APPs -  Physician Assistants and Nurse Practitioners) who all work together to provide you with the care you need, when you need it.  Your next appointment:   6 month(s)  The format for your next appointment:   In Person  Provider:   Cherlynn Kaiser, MD  Other Instructions Do not drive for 6 months. Syncope Syncope is when you pass out (faint) for a short time. It is caused by a sudden decrease in blood flow to the brain. Signs that you may be about to pass out include:  Feeling dizzy or light-headed.  Feeling sick to your stomach  (nauseous).  Seeing all white or all black.  Having cold, clammy skin. If you pass out, get help right away. Call your local emergency services (911 in the [place] Do not drive yourself to the hospital. Follow these instructions at home: Watch for any changes in your symptoms. Take these actions to stay safe and help with your symptoms: Lifestyle  Do not drive, use machinery, or play sports until your doctor says it is okay.  Do not drink alcohol.  Do not use any products that contain nicotine or tobacco, such as cigarettes and e-cigarettes. If you need help quitting, ask your doctor.  Drink enough fluid to keep your pee (urine) pale yellow. General instructions  Take over-the-counter and prescription medicines only as told by your doctor.  If you are taking blood pressure or heart medicine, sit up and stand up slowly. Spend a few minutes getting ready to sit and then stand. This can help you feel less dizzy.  Have someone stay with you until you feel stable.  If you start to feel like you might pass out, lie down right away and raise (elevate) your feet above the level of your heart. Breathe deeply and steadily. Wait until all of the symptoms are gone.  Keep all follow-up visits as told by your doctor. This is important. Get help right away if:  You have a very bad headache.  You pass out once or more than once.  You have pain in your chest, belly, or back.  You have a  very fast or uneven heartbeat (palpitations).  It hurts to breathe.  You are bleeding from your mouth or your bottom (rectum).  You have black or tarry poop (stool).  You have jerky movements that you cannot control (seizure).  You are confused.  You have trouble walking.  You are very weak.  You have vision problems. These symptoms may be an emergency. Do not wait to see if the symptoms will go away. Get medical help right away. Call your local emergency services (911 in the [place] Do not drive  yourself to the hospital. Summary  Syncope is when you pass out (faint) for a short time. It is caused by a sudden decrease in blood flow to the brain.  Signs that you may be about to faint include feeling dizzy, light-headed, or sick to your stomach, seeing all white or all black, or having cold, clammy skin.  If you start to feel like you might pass out, lie down right away and raise (elevate) your feet above the level of your heart. Breathe deeply and steadily. Wait until all of the symptoms are gone. This information is not intended to replace advice given to you by your health care provider. Make sure you discuss any questions you have with your health care provider. Document Revised: 10/17/2017 Document Reviewed: 10/17/2017 Elsevier Patient Education  2020 [employer].

## 2019-10-14 ENCOUNTER — Other Ambulatory Visit: Payer: Self-pay

## 2019-10-14 DIAGNOSIS — Z01818 Encounter for other preprocedural examination: Secondary | ICD-10-CM | POA: Diagnosis not present

## 2019-10-14 DIAGNOSIS — Q231 Congenital insufficiency of aortic valve: Secondary | ICD-10-CM

## 2019-10-14 LAB — BASIC METABOLIC PANEL
BUN/Creatinine Ratio: 14 (ref 12–28)
BUN: 11 mg/dL (ref 8–27)
CO2: 21 mmol/L (ref 20–29)
Calcium: 8.9 mg/dL (ref 8.7–10.3)
Chloride: 101 mmol/L (ref 96–106)
Creatinine, Ser: 0.77 mg/dL (ref 0.57–1.00)
GFR calc Af Amer: 94 mL/min/{1.73_m2} (ref >59)
GFR calc non Af Amer: 82 mL/min/{1.73_m2} (ref >59)
Glucose: 84 mg/dL (ref 65–99)
Potassium: 4.6 mmol/L (ref 3.5–5.2)
Sodium: 136 mmol/L (ref 134–144)

## 2019-10-21 ENCOUNTER — Other Ambulatory Visit: Payer: Self-pay

## 2019-10-21 ENCOUNTER — Encounter (HOSPITAL_COMMUNITY): Payer: Self-pay

## 2019-10-21 ENCOUNTER — Ambulatory Visit (HOSPITAL_COMMUNITY): Payer: Medicare HMO

## 2019-10-22 ENCOUNTER — Ambulatory Visit (HOSPITAL_COMMUNITY)
Admission: RE | Admit: 2019-10-22 | Discharge: 2019-10-22 | Disposition: A | Discharge status: Home / Self Care | Payer: Medicare HMO | Source: Ambulatory Visit | Attending: Internal Medicine | Admitting: Internal Medicine

## 2019-10-22 ENCOUNTER — Other Ambulatory Visit: Payer: Self-pay

## 2019-10-22 DIAGNOSIS — R55 Syncope and collapse: Secondary | ICD-10-CM | POA: Diagnosis not present

## 2019-10-22 DIAGNOSIS — Q231 Congenital insufficiency of aortic valve: Secondary | ICD-10-CM | POA: Diagnosis present

## 2019-10-22 MED ORDER — IOHEXOL 350 MG/ML SOLN
100.0000 mL | Freq: Once | INTRAVENOUS | Status: AC | PRN
  Administered 2019-10-22: 100 mL via INTRAVENOUS

## 2019-10-23 DIAGNOSIS — K219 Gastro-esophageal reflux disease without esophagitis: Secondary | ICD-10-CM | POA: Diagnosis not present

## 2019-10-23 DIAGNOSIS — J449 Chronic obstructive pulmonary disease, unspecified: Secondary | ICD-10-CM | POA: Diagnosis not present

## 2019-10-23 DIAGNOSIS — Z823 Family history of stroke: Secondary | ICD-10-CM | POA: Diagnosis not present

## 2019-10-23 DIAGNOSIS — J309 Allergic rhinitis, unspecified: Secondary | ICD-10-CM | POA: Diagnosis not present

## 2019-10-23 DIAGNOSIS — R69 Illness, unspecified: Secondary | ICD-10-CM | POA: Diagnosis not present

## 2019-10-23 DIAGNOSIS — Z79899 Other long term (current) drug therapy: Secondary | ICD-10-CM | POA: Diagnosis not present

## 2019-10-23 DIAGNOSIS — E039 Hypothyroidism, unspecified: Secondary | ICD-10-CM | POA: Diagnosis not present

## 2019-10-23 DIAGNOSIS — M810 Age-related osteoporosis without current pathological fracture: Secondary | ICD-10-CM | POA: Diagnosis not present

## 2019-11-24 DIAGNOSIS — Z01818 Encounter for other preprocedural examination: Secondary | ICD-10-CM | POA: Diagnosis not present

## 2019-11-24 DIAGNOSIS — H2511 Age-related nuclear cataract, right eye: Secondary | ICD-10-CM | POA: Diagnosis not present

## 2019-11-24 DIAGNOSIS — H2512 Age-related nuclear cataract, left eye: Secondary | ICD-10-CM | POA: Diagnosis not present

## 2019-11-26 DIAGNOSIS — S46811A Strain of other muscles, fascia and tendons at shoulder and upper arm level, right arm, initial encounter: Secondary | ICD-10-CM | POA: Diagnosis not present

## 2019-11-26 DIAGNOSIS — M25511 Pain in right shoulder: Secondary | ICD-10-CM | POA: Diagnosis not present

## 2019-11-27 DIAGNOSIS — E785 Hyperlipidemia, unspecified: Secondary | ICD-10-CM | POA: Diagnosis not present

## 2019-11-27 DIAGNOSIS — E039 Hypothyroidism, unspecified: Secondary | ICD-10-CM | POA: Diagnosis not present

## 2019-11-27 DIAGNOSIS — J449 Chronic obstructive pulmonary disease, unspecified: Secondary | ICD-10-CM | POA: Diagnosis not present

## 2019-11-27 DIAGNOSIS — M810 Age-related osteoporosis without current pathological fracture: Secondary | ICD-10-CM | POA: Diagnosis not present

## 2019-11-27 DIAGNOSIS — R69 Illness, unspecified: Secondary | ICD-10-CM | POA: Diagnosis not present

## 2019-12-08 DIAGNOSIS — M810 Age-related osteoporosis without current pathological fracture: Secondary | ICD-10-CM | POA: Diagnosis not present

## 2019-12-11 DIAGNOSIS — H25811 Combined forms of age-related cataract, right eye: Secondary | ICD-10-CM | POA: Diagnosis not present

## 2019-12-11 DIAGNOSIS — H2511 Age-related nuclear cataract, right eye: Secondary | ICD-10-CM | POA: Diagnosis not present

## 2019-12-25 DIAGNOSIS — H2512 Age-related nuclear cataract, left eye: Secondary | ICD-10-CM | POA: Diagnosis not present

## 2019-12-26 ENCOUNTER — Other Ambulatory Visit: Payer: Self-pay | Admitting: Family Medicine

## 2019-12-26 DIAGNOSIS — Z1231 Encounter for screening mammogram for malignant neoplasm of breast: Secondary | ICD-10-CM

## 2019-12-27 ENCOUNTER — Ambulatory Visit: Payer: Medicare HMO | Attending: Internal Medicine

## 2019-12-27 DIAGNOSIS — Z23 Encounter for immunization: Secondary | ICD-10-CM

## 2019-12-27 NOTE — Progress Notes (Signed)
   Covid-19 Vaccination Clinic  Name:  [name]    MRN: [id number] DOB: 12/26/1955  12/27/2019  [name] was observed post Covid-19 immunization for 15 minutes without incident. She was provided with Vaccine Information Sheet and instruction to access the V-Safe system.   [name] was instructed to call 911 with any severe reactions post vaccine: Marland Kitchen Difficulty breathing  . Swelling of face and throat  . A fast heartbeat  . A bad rash all over body  . Dizziness and weakness   Immunizations Administered    Name Date Dose VIS Date Route   Pfizer COVID-19 Vaccine 12/27/2019 11:50 AM 0.3 mL 08/29/2019 Intramuscular   Manufacturer: [place]   Lot: [id number]   [place]: [id number]

## 2020-01-01 ENCOUNTER — Other Ambulatory Visit: Payer: Self-pay

## 2020-01-01 ENCOUNTER — Ambulatory Visit
Admission: RE | Admit: 2020-01-01 | Discharge: 2020-01-01 | Disposition: A | Discharge status: Home / Self Care | Payer: Medicare HMO | Source: Ambulatory Visit | Attending: Family Medicine | Admitting: Family Medicine

## 2020-01-01 DIAGNOSIS — Z1231 Encounter for screening mammogram for malignant neoplasm of breast: Secondary | ICD-10-CM

## 2020-01-20 ENCOUNTER — Ambulatory Visit: Payer: Medicare HMO | Attending: Internal Medicine

## 2020-01-20 DIAGNOSIS — Z23 Encounter for immunization: Secondary | ICD-10-CM

## 2020-01-20 NOTE — Progress Notes (Signed)
   Covid-19 Vaccination Clinic  Name:  [name]    MRN: [id number] DOB: 03-11-1956  01/20/2020  [name] was observed post Covid-19 immunization for 15 minutes without incident. She was provided with Vaccine Information Sheet and instruction to access the V-Safe system.   [name] was instructed to call 911 with any severe reactions post vaccine: Marland Kitchen Difficulty breathing  . Swelling of face and throat  . A fast heartbeat  . A bad rash all over body  . Dizziness and weakness   Immunizations Administered    Name Date Dose VIS Date Route   Pfizer COVID-19 Vaccine 01/20/2020  2:28 PM 0.3 mL 11/12/2018 Intramuscular   Manufacturer: [place]   Lot: [id number]   [place]: [id number]

## 2020-02-06 DIAGNOSIS — J301 Allergic rhinitis due to pollen: Secondary | ICD-10-CM | POA: Diagnosis not present

## 2020-02-06 DIAGNOSIS — E559 Vitamin D deficiency, unspecified: Secondary | ICD-10-CM | POA: Diagnosis not present

## 2020-02-06 DIAGNOSIS — R69 Illness, unspecified: Secondary | ICD-10-CM | POA: Diagnosis not present

## 2020-02-06 DIAGNOSIS — E785 Hyperlipidemia, unspecified: Secondary | ICD-10-CM | POA: Diagnosis not present

## 2020-02-06 DIAGNOSIS — E538 Deficiency of other specified B group vitamins: Secondary | ICD-10-CM | POA: Diagnosis not present

## 2020-02-06 DIAGNOSIS — E039 Hypothyroidism, unspecified: Secondary | ICD-10-CM | POA: Diagnosis not present

## 2020-02-06 DIAGNOSIS — E441 Mild protein-calorie malnutrition: Secondary | ICD-10-CM | POA: Diagnosis not present

## 2020-02-06 DIAGNOSIS — J449 Chronic obstructive pulmonary disease, unspecified: Secondary | ICD-10-CM | POA: Diagnosis not present

## 2020-02-06 DIAGNOSIS — G44209 Tension-type headache, unspecified, not intractable: Secondary | ICD-10-CM | POA: Diagnosis not present

## 2020-02-19 ENCOUNTER — Telehealth: Payer: Self-pay | Admitting: Acute Care

## 2020-02-19 DIAGNOSIS — F17210 Nicotine dependence, cigarettes, uncomplicated: Secondary | ICD-10-CM

## 2020-02-19 DIAGNOSIS — Z87891 Personal history of nicotine dependence: Secondary | ICD-10-CM

## 2020-02-22 ENCOUNTER — Other Ambulatory Visit: Payer: Self-pay | Admitting: Psychiatry

## 2020-02-23 NOTE — Telephone Encounter (Signed)
Spoke with pt and scheduled SDMV 03/24/20 11:00 CT ordered Nothing further needed

## 2020-03-06 ENCOUNTER — Other Ambulatory Visit: Payer: Self-pay | Admitting: Psychiatry

## 2020-03-06 DIAGNOSIS — F4001 Agoraphobia with panic disorder: Secondary | ICD-10-CM

## 2020-03-10 ENCOUNTER — Other Ambulatory Visit: Payer: Self-pay | Admitting: Psychiatry

## 2020-03-10 DIAGNOSIS — F4310 Post-traumatic stress disorder, unspecified: Secondary | ICD-10-CM

## 2020-03-24 ENCOUNTER — Other Ambulatory Visit: Payer: Self-pay

## 2020-03-24 ENCOUNTER — Ambulatory Visit (INDEPENDENT_AMBULATORY_CARE_PROVIDER_SITE_OTHER)
Admission: RE | Admit: 2020-03-24 | Discharge: 2020-03-24 | Disposition: A | Discharge status: Home / Self Care | Payer: Medicare HMO | Source: Ambulatory Visit | Attending: Acute Care | Admitting: Acute Care

## 2020-03-24 ENCOUNTER — Encounter: Payer: Self-pay | Admitting: Acute Care

## 2020-03-24 ENCOUNTER — Ambulatory Visit (INDEPENDENT_AMBULATORY_CARE_PROVIDER_SITE_OTHER): Payer: Medicare HMO | Admitting: Acute Care

## 2020-03-24 DIAGNOSIS — Z87891 Personal history of nicotine dependence: Secondary | ICD-10-CM

## 2020-03-24 DIAGNOSIS — F17210 Nicotine dependence, cigarettes, uncomplicated: Secondary | ICD-10-CM | POA: Diagnosis not present

## 2020-03-24 DIAGNOSIS — Z122 Encounter for screening for malignant neoplasm of respiratory organs: Secondary | ICD-10-CM

## 2020-03-24 DIAGNOSIS — R69 Illness, unspecified: Secondary | ICD-10-CM | POA: Diagnosis not present

## 2020-03-24 NOTE — Progress Notes (Signed)
Shared Decision Making Visit Lung Cancer Screening Program [phone])   Eligibility:  Age 64 y.o. Pack Years Smoking History Calculation 41 pack year smoking history (# packs/per year x # years smoked)  Recent History of coughing up blood  no  Unexplained weight loss? no ( >Than 15 pounds within the last 6 months )  Prior History Lung / other cancer no (Diagnosis within the last 5 years already requiring surveillance chest CT Scans).  Smoking Status Current Smoker  Former Smokers: Years since quit: NA  Quit Date: NA  Visit Components:  Discussion included one or more decision making aids. yes  Discussion included risk/benefits of screening. yes  Discussion included potential follow up diagnostic testing for abnormal scans. yes  Discussion included meaning and risk of over diagnosis. yes  Discussion included meaning and risk of False Positives. yes  Discussion included meaning of total radiation exposure. yes  Counseling Included:  Importance of adherence to annual lung cancer LDCT screening. yes  Impact of comorbidities on ability to participate in the program. yes  Ability and willingness to under diagnostic treatment. yes  Smoking Cessation Counseling:  Current Smokers:   Discussed importance of smoking cessation. yes  Information about tobacco cessation classes and interventions provided to patient. yes  Patient provided with "ticket" for LDCT Scan. yes  Symptomatic Patient. no  Counseling  Diagnosis Code: Tobacco Use Z72.0  Asymptomatic Patient yes  Counseling (Intermediate counseling: > three minutes counseling) [id number]  Former Smokers:   Discussed the importance of maintaining cigarette abstinence. yes  Diagnosis Code: Personal History of Nicotine Dependence. Q67.619  Information about tobacco cessation classes and interventions provided to patient. Yes  Patient provided with "ticket" for LDCT Scan. yes  Written Order for Lung Cancer Screening  with LDCT placed in Epic. Yes (CT Chest Lung Cancer Screening Low Dose W/O CM) [id number] Z12.2-Screening of respiratory organs Z87.891-Personal history of nicotine dependence  Pt. Does want to quit smoking. She has not set a quit date.   I have spent 25 minutes of face to face time with Ms. Gauger discussing the risks and benefits of lung cancer screening. We viewed a power point together that explained in detail the above noted topics. We paused at intervals to allow for questions to be asked and answered to ensure understanding.We discussed that the single most powerful action that she can take to decrease her risk of developing lung cancer is to quit smoking. We discussed whether or not she is ready to commit to setting a quit date. We discussed options for tools to aid in quitting smoking including nicotine replacement therapy, non-nicotine medications, support groups, Quit Smart classes, and behavior modification. We discussed that often times setting smaller, more achievable goals, such as eliminating 1 cigarette a day for a week and then 2 cigarettes a day for a week can be helpful in slowly decreasing the number of cigarettes smoked. This allows for a sense of accomplishment as well as providing a clinical benefit. I gave her the " Be Stronger Than Your Excuses" card with contact information for community resources, classes, free nicotine replacement therapy, and access to mobile apps, text messaging, and on-line smoking cessation help. I have also given her my card and contact information in the event she needs to contact me. We discussed the time and location of the scan, and that either Doroteo Glassman RN or I will call with the results within 24-48 hours of receiving them. I have offered her  a copy of the power  point we viewed  as a resource in the event they need reinforcement of the concepts we discussed today in the office. The patient verbalized understanding of all of  the above and had no  further questions upon leaving the office. They have my contact information in the event they have any further questions.  I spent 4 minutes counseling on smoking cessation and the health risks of continued tobacco abuse.  I explained to the patient that there has been a high incidence of coronary artery disease noted on these exams. I explained that this is a non-gated exam therefore degree or severity cannot be determined. This patient is currently on statin therapy. I have asked the patient to follow-up with their PCP regarding any incidental finding of coronary artery disease and management with diet or medication as their PCP  feels is clinically indicated. The patient verbalized understanding of the above and had no further questions upon completion of the visit.  Magdalen Spatz, NP  03/24/2020

## 2020-03-25 NOTE — Progress Notes (Signed)

## 2020-03-26 ENCOUNTER — Other Ambulatory Visit: Payer: Self-pay | Admitting: *Deleted

## 2020-03-26 DIAGNOSIS — F17210 Nicotine dependence, cigarettes, uncomplicated: Secondary | ICD-10-CM

## 2020-03-26 DIAGNOSIS — Z87891 Personal history of nicotine dependence: Secondary | ICD-10-CM

## 2020-04-20 ENCOUNTER — Encounter: Payer: Self-pay | Admitting: Internal Medicine

## 2020-04-20 ENCOUNTER — Ambulatory Visit (INDEPENDENT_AMBULATORY_CARE_PROVIDER_SITE_OTHER): Payer: Medicare HMO | Admitting: Internal Medicine

## 2020-04-20 ENCOUNTER — Other Ambulatory Visit: Payer: Self-pay

## 2020-04-20 VITALS — BP 100/70 | HR 65 | Ht 67.0 in | Wt 127.4 lb

## 2020-04-20 DIAGNOSIS — J449 Chronic obstructive pulmonary disease, unspecified: Secondary | ICD-10-CM

## 2020-04-20 DIAGNOSIS — Q231 Congenital insufficiency of aortic valve: Secondary | ICD-10-CM

## 2020-04-20 DIAGNOSIS — Z79899 Other long term (current) drug therapy: Secondary | ICD-10-CM

## 2020-04-20 DIAGNOSIS — E785 Hyperlipidemia, unspecified: Secondary | ICD-10-CM | POA: Diagnosis not present

## 2020-04-20 DIAGNOSIS — R0600 Dyspnea, unspecified: Secondary | ICD-10-CM | POA: Diagnosis not present

## 2020-04-20 DIAGNOSIS — R0609 Other forms of dyspnea: Secondary | ICD-10-CM

## 2020-04-20 DIAGNOSIS — R55 Syncope and collapse: Secondary | ICD-10-CM | POA: Diagnosis not present

## 2020-04-20 MED ORDER — ATORVASTATIN CALCIUM 80 MG PO TABS
80.0000 mg | ORAL_TABLET | Freq: Every day | ORAL | 3 refills | Status: DC
Start: 2020-04-20 — End: 2022-10-04

## 2020-04-20 MED ORDER — ASPIRIN EC 81 MG PO TBEC
81.0000 mg | DELAYED_RELEASE_TABLET | Freq: Every day | ORAL | 3 refills | Status: AC
Start: 2020-04-20 — End: ?

## 2020-04-20 NOTE — Progress Notes (Signed)
Cardiology Office Note:    Date:  04/20/2020   ID:  [name], DOB March 17, 1956, MRN [id number]  PCP:  [name], MD  Cardiologist:  No primary care provider on file.  Electrophysiologist:  None   Referring MD: [name], MD   Chief Complaint: syncope follow up, DOE  History of Present Illness:    [name] is a 64 y.o. female with a history of mild bicuspid aortic valve stenosis, COPD, GERD, hypothyroidism. She presents today after a syncopal episode on November 14.  She tells me she was shopping at [facility] and when she went out into the parking lot she blacked out she does not remember having a prodrome she was quite dizzy.  Her evaluation was benign for an etiology for syncope. Driving restrictions placed for 6 mo. No interval events, cleared to drive unless recurrence.  Today she describes DOE. She is a current smoker. She is concerned about DOE with minimal exertion. No significant resting dyspnea. Mild chest discomfort in conjunction. No palpitations.   Past Medical History:  Diagnosis Date  . Bipolar disorder (HCC)    PT ON TOPIRAMATE AND ZANAX- SEES PSYCHIATRIST DR. COTTLE - PT STATES DOING WELL ON MEDICATIONS  . COPD (chronic obstructive pulmonary disease) ([place])    OCCAS USE OF INHALER-QUIT SMOKING 2014 - USING E- CIGARETTE  . Diverticulitis   . GERD (gastroesophageal reflux disease)   . H/O suicide attempt 2007   PT'S SON HAD COMMITTED SUICIDE AND SHE WAS OVER COME WITH GRIEF  . Hypothyroidism   . IBS (irritable bowel syndrome)   . Lichen simplex chronicus   . MVP (mitral valve prolapse)    DOES NOT CAUSE ANY PROBLEMS  . Osteoporosis   . Pain    RIGHT SHOULDER AND LIMITED ROM - -RT SHOULDER IMPINGEMENT, ADHESIVE CAPSULITIS, ROTATOR CUFF TEAR  . Pain    NECK PAIN -PT HOPING SURGERY ON HER SHOULDER HELPS HER NECK PAIN- DOES HAVE HX OF CERBVICAL DISK FUSION.  . Pain    LUMBAR PAIN - DDD AND PREVIOUS LUMBAR SURGERY  . Vitamin D deficiency     Past  Surgical History:  Procedure Laterality Date  . ABDOMINAL HYSTERECTOMY  1975  . BACK SURGERY     LUMBAR SURGERY FOR HNP  . BREAST SURGERY     BREAST REDUCTION  . CERVICAL DISK FUSION    . LEFT OOPHORECTOMY  1994  . REDUCTION MAMMAPLASTY    . SHOULDER ARTHROSCOPY WITH SUBACROMIAL DECOMPRESSION AND OPEN ROTATOR C Right 02/19/2014   Procedure: RIGHT SHOULDER ARTHROSCOPY SUBACROMIAL DECOMPRESSION EVALUATION UNDER ANESTHESIA AND  MANIPULATION UNDER ANESTHIESIA DEBRIDEMENT OF PARTIAL ROTATOR CUFF;  Surgeon: Johnn Hai, MD;  Location: [facility];  Service: Orthopedics;  Laterality: Right;    Current Medications: Current Meds  Medication Sig  . Acetylcysteine (N-ACETYL-L-CYSTEINE) 600 MG CAPS Take 600 mg by mouth daily.  Marland Kitchen albuterol (PROVENTIL HFA;VENTOLIN HFA) 108 (90 BASE) MCG/ACT inhaler Inhale 1 puff into the lungs every 6 (six) hours as needed for wheezing or shortness of breath.  . ALPRAZolam (XANAX) 0.5 MG tablet TAKE 1 TABLET BY MOUTH 4 TIMES A DAY  . atorvastatin (LIPITOR) 20 MG tablet Take 20 mg by mouth daily.  . citalopram (CELEXA) 20 MG tablet TAKE 1 TABLET BY MOUTH EVERY DAY  . denosumab (PROLIA) 60 MG/ML SOLN injection Inject 60 mg into the skin every 6 (six) months. Administer in upper arm, thigh, or abdomen  . dexlansoprazole (DEXILANT) 60 MG capsule Take 60 mg by  mouth daily.  . diphenhydrAMINE (BENADRYL) 50 MG tablet Take one 50 mg tablet one hour before the test with your last dose of the prednisone.  Marland Kitchen levothyroxine (SYNTHROID, LEVOTHROID) 75 MCG tablet Take 88 mcg by mouth daily before breakfast.   . Lurasidone HCl 120 MG TABS Take 1 tablet (120 mg total) by mouth daily.  Marland Kitchen omeprazole (PRILOSEC) 40 MG capsule Take 40 mg by mouth daily.  . predniSONE (DELTASONE) 50 MG tablet Take one 50 mg tablet 13 hours, 7 hours and 1 hour before the test.  . topiramate (TOPAMAX) 100 MG tablet TAKE 1 AND 1/2 TABLETS DAILY BY MOUTH  . Vitamin D, Ergocalciferol, (DRISDOL) 50000 UNITS CAPS  capsule Take 50,000 Units by mouth every 7 (seven) days.     Allergies:   Alendronate sodium, Fiorinal [butalbital-aspirin-caffeine], Iohexol, Klonopin [clonazepam], Other, Tramadol, and Dexilant [dexlansoprazole]   Social History   Socioeconomic History  . Marital status: Married    Spouse name: Not on file  . Number of children: Not on file  . Years of education: Not on file  . Highest education level: Not on file  Occupational History  . Not on file  Tobacco Use  . Smoking status: Current Every Day Smoker    Packs/day: 0.75    Years: 55.00    Pack years: 41.25    Types: Cigarettes  . Smokeless tobacco: Current User  Substance and Sexual Activity  . Alcohol use: No    Comment: SMOKING E- CIGARETTES  . Drug use: No  . Sexual activity: Not on file  Other Topics Concern  . Not on file  Social History Narrative  . Not on file   Social Determinants of Health   Financial Resource Strain:   . Difficulty of Paying Living Expenses:   Food Insecurity:   . Worried About Charity fundraiser in the Last Year:   . Arboriculturist in the Last Year:   Transportation Needs:   . Film/video editor (Medical):   Marland Kitchen Lack of Transportation (Non-Medical):   Physical Activity:   . Days of Exercise per Week:   . Minutes of Exercise per Session:   Stress:   . Feeling of Stress :   Social Connections:   . Frequency of Communication with Friends and Family:   . Frequency of Social Gatherings with Friends and Family:   . Attends Religious Services:   . Active Member of Clubs or Organizations:   . Attends Archivist Meetings:   Marland Kitchen Marital Status:      Family History: The patient's family history includes Heart attack in her paternal grandmother; Heart disease in her father and mother; Heart failure in her mother; Stroke in her mother.  ROS:   Please see the history of present illness.    All other systems reviewed and are negative.  EKGs/Labs/Other Studies Reviewed:     The following studies were reviewed today:  EKG:  NSR, septal infarct pattern, low voltage QRS  Recent Labs: 10/14/2019: BUN 11; Creatinine, Ser 0.77; Potassium 4.6; Sodium 136  Recent Lipid Panel No results found for: CHOL, TRIG, HDL, CHOLHDL, VLDL, LDLCALC, LDLDIRECT  Physical Exam:    VS:  BP 100/70 (BP Location: Left Arm, Patient Position: Sitting, Cuff Size: Normal)   Pulse 65   Ht 5\' 7"  (1.702 m)   Wt 127 lb 6.4 oz (57.8 kg)   BMI 19.95 kg/m     Wt Readings from Last 5 Encounters:  04/20/20 127 lb  6.4 oz (57.8 kg)  10/08/19 124 lb 9.6 oz (56.5 kg)  08/25/19 126 lb (57.2 kg)  02/11/14 143 lb (64.9 kg)     Constitutional: No acute distress Eyes: sclera non-icteric, normal conjunctiva and lids ENMT: normal dentition, moist mucous membranes Cardiovascular: regular rhythm, normal rate, no murmurs. S1 and S2 normal. Radial pulses normal bilaterally. No jugular venous distention.  Respiratory: clear to auscultation bilaterally GI : normal bowel sounds, soft and nontender. No distention.   MSK: extremities warm, well perfused. No edema.  NEURO: grossly nonfocal exam, moves all extremities. PSYCH: alert and oriented x 3, normal mood and affect.   ASSESSMENT:    1. Dyspnea on exertion   2. Bicuspid aortic valve   3. Syncope, unspecified syncope type   4. Chronic obstructive pulmonary disease, unspecified COPD type ([place])   5. Hyperlipidemia, unspecified hyperlipidemia type   6. Medication management    PLAN:    Dyspnea on exertion - Plan: EKG 12-Lead, MYOCARDIAL PERFUSION IMAGING -given risk factors of smoking, and HLD, recommend ischemic evaluation. Will attempt treadmill myoview and if unable to complete will convert to lexiscan - no active wheezing.  - mild CAC on recent lung CT.  Bicuspid aortic valve - mild stenosis. Will repeat echo in 2-3 years or sooner for symptoms. -BAV calcifications on recent noncontrast CT, recommend ASA and atorvastatin.   Syncope,  unspecified syncope type - no recurrence  Chronic obstructive pulmonary disease, unspecified COPD type ([place]) - followed by PCP and pulmonary.   Hyperlipidemia, unspecified hyperlipidemia type - LDL 109, goal is <70. Increase atorvastatin to 80 mg daily.   Total time of encounter: 30 minutes total time of encounter, including 25 minutes spent in face-to-face patient care on the date of this encounter. This time includes coordination of care and counseling regarding above mentioned problem list. Remainder of non-face-to-face time involved reviewing chart documents/testing relevant to the patient encounter and documentation in the medical record. I have independently reviewed documentation from referring provider.   Cherlynn Kaiser, MD [place]  [facility]    Medication Adjustments/Labs and Tests Ordered: Current medicines are reviewed at length with the patient today.  Concerns regarding medicines are outlined above.  Orders Placed This Encounter  Procedures  . MYOCARDIAL PERFUSION IMAGING  . EKG 12-Lead   Meds ordered this encounter  Medications  . atorvastatin (LIPITOR) 80 MG tablet    Sig: Take 1 tablet (80 mg total) by mouth daily.    Dispense:  90 tablet    Refill:  3  . aspirin EC 81 MG tablet    Sig: Take 1 tablet (81 mg total) by mouth daily. Swallow whole.    Dispense:  90 tablet    Refill:  3    Patient Instructions  Medication Instructions:  START Aspirin 81mg  Daily INCREASED Lipitor to 80mg  Daily *If you need a refill on your cardiac medications before your next appointment, please call your pharmacy*   Lab Work: None ordered If you have labs (blood work) drawn today and your tests are completely normal, you will receive your results only by: Marland Kitchen MyChart Message (if you have MyChart) OR . A paper copy in the mail If you have any lab test that is abnormal or we need to change your treatment, we will call you to review the  results.   Testing/Procedures: This will be done at [address].  Your physician has requested that you have en exercise stress myoview. Please follow instruction sheet,  as given.    Follow-Up: At [facility], you and your health needs are our priority.  As part of our continuing mission to provide you with exceptional heart care, we have created designated Provider Care Teams.  These Care Teams include your primary Cardiologist (physician) and Advanced Practice Providers (APPs -  Physician Assistants and Nurse Practitioners) who all work together to provide you with the care you need, when you need it.   Your next appointment:   1 month(s) after stress myoview  The format for your next appointment:   In Person  Provider:   Cherlynn Kaiser, MD   Other Instructions

## 2020-04-20 NOTE — Patient Instructions (Signed)
Medication Instructions:  START Aspirin 81mg  Daily INCREASED Lipitor to 80mg  Daily *If you need a refill on your cardiac medications before your next appointment, please call your pharmacy*   Lab Work: None ordered If you have labs (blood work) drawn today and your tests are completely normal, you will receive your results only by: Marland Kitchen MyChart Message (if you have MyChart) OR . A paper copy in the mail If you have any lab test that is abnormal or we need to change your treatment, we will call you to review the results.   Testing/Procedures: This will be done at [address].  Your physician has requested that you have en exercise stress myoview. Please follow instruction sheet, as given.    Follow-Up: At [facility], you and your health needs are our priority.  As part of our continuing mission to provide you with exceptional heart care, we have created designated Provider Care Teams.  These Care Teams include your primary Cardiologist (physician) and Advanced Practice Providers (APPs -  Physician Assistants and Nurse Practitioners) who all work together to provide you with the care you need, when you need it.   Your next appointment:   1 month(s) after stress myoview  The format for your next appointment:   In Person  Provider:   Cherlynn Kaiser, MD   Other Instructions

## 2020-04-22 ENCOUNTER — Telehealth (HOSPITAL_COMMUNITY): Payer: Self-pay | Admitting: *Deleted

## 2020-04-22 NOTE — Telephone Encounter (Signed)
Patient given detailed instructions per Myocardial Perfusion Study Information Sheet for the test on 04/27/20 at 10:00. Patient notified to arrive 15 minutes early and that it is imperative to arrive on time for appointment to keep from having the test rescheduled.  If you need to cancel or reschedule your appointment, please call the office within 24 hours of your appointment. . Patient verbalized understanding.[name]

## 2020-04-24 ENCOUNTER — Other Ambulatory Visit (HOSPITAL_COMMUNITY)
Admission: RE | Admit: 2020-04-24 | Discharge: 2020-04-24 | Disposition: A | Discharge status: Home / Self Care | Payer: Medicare HMO | Source: Ambulatory Visit | Attending: Internal Medicine | Admitting: Internal Medicine

## 2020-04-24 DIAGNOSIS — Z01812 Encounter for preprocedural laboratory examination: Secondary | ICD-10-CM | POA: Diagnosis not present

## 2020-04-24 DIAGNOSIS — Z20822 Contact with and (suspected) exposure to covid-19: Secondary | ICD-10-CM | POA: Diagnosis not present

## 2020-04-24 LAB — SARS CORONAVIRUS 2 (TAT 6-24 HRS): SARS Coronavirus 2: NEGATIVE

## 2020-04-27 ENCOUNTER — Other Ambulatory Visit: Payer: Self-pay

## 2020-04-27 ENCOUNTER — Ambulatory Visit (HOSPITAL_COMMUNITY): Payer: Medicare HMO | Attending: Cardiology

## 2020-04-27 DIAGNOSIS — R0609 Other forms of dyspnea: Secondary | ICD-10-CM

## 2020-04-27 DIAGNOSIS — R0600 Dyspnea, unspecified: Secondary | ICD-10-CM | POA: Diagnosis not present

## 2020-04-27 LAB — MYOCARDIAL PERFUSION IMAGING
LV dias vol: 59 mL (ref 46–106)
LV sys vol: 18 mL
Peak HR: 82 {beats}/min
Rest HR: 55 {beats}/min
SDS: 2
SRS: 0
SSS: 2
TID: 1.2

## 2020-04-27 MED ORDER — TECHNETIUM TC 99M TETROFOSMIN IV KIT
9.6000 | PACK | Freq: Once | INTRAVENOUS | Status: AC | PRN
  Administered 2020-04-27: 9.6 via INTRAVENOUS
  Filled 2020-04-27: qty 10

## 2020-04-27 MED ORDER — REGADENOSON 0.4 MG/5ML IV SOLN
0.4000 mg | Freq: Once | INTRAVENOUS | Status: AC
  Administered 2020-04-27: 0.4 mg via INTRAVENOUS

## 2020-04-27 MED ORDER — TECHNETIUM TC 99M TETROFOSMIN IV KIT
31.4000 | PACK | Freq: Once | INTRAVENOUS | Status: AC | PRN
  Administered 2020-04-27: 31.4 via INTRAVENOUS
  Filled 2020-04-27: qty 32

## 2020-05-17 ENCOUNTER — Encounter: Payer: Self-pay | Admitting: Psychiatry

## 2020-05-17 ENCOUNTER — Ambulatory Visit (INDEPENDENT_AMBULATORY_CARE_PROVIDER_SITE_OTHER): Payer: Medicare HMO | Admitting: Psychiatry

## 2020-05-17 ENCOUNTER — Other Ambulatory Visit: Payer: Self-pay

## 2020-05-17 DIAGNOSIS — F4001 Agoraphobia with panic disorder: Secondary | ICD-10-CM

## 2020-05-17 DIAGNOSIS — F4310 Post-traumatic stress disorder, unspecified: Secondary | ICD-10-CM | POA: Diagnosis not present

## 2020-05-17 DIAGNOSIS — F411 Generalized anxiety disorder: Secondary | ICD-10-CM

## 2020-05-17 DIAGNOSIS — F3181 Bipolar II disorder: Secondary | ICD-10-CM

## 2020-05-17 DIAGNOSIS — R69 Illness, unspecified: Secondary | ICD-10-CM | POA: Diagnosis not present

## 2020-05-17 MED ORDER — ALPRAZOLAM 0.5 MG PO TABS: 0.5000 mg | ORAL_TABLET | Freq: Four times a day (QID) | ORAL | 5 refills | Status: DC

## 2020-05-17 MED ORDER — LURASIDONE HCL 120 MG PO TABS: 120.0000 mg | ORAL_TABLET | Freq: Every day | ORAL | 5 refills | Status: DC

## 2020-05-17 MED ORDER — CITALOPRAM HYDROBROMIDE 20 MG PO TABS: 30.0000 mg | ORAL_TABLET | Freq: Every day | ORAL | 0 refills | Status: DC

## 2020-05-17 MED ORDER — TOPIRAMATE 100 MG PO TABS: 150.0000 mg | ORAL_TABLET | Freq: Every day | ORAL | 3 refills | Status: DC
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
## 2020-05-21 DIAGNOSIS — E538 Deficiency of other specified B group vitamins: Secondary | ICD-10-CM | POA: Diagnosis not present

## 2020-05-21 DIAGNOSIS — E039 Hypothyroidism, unspecified: Secondary | ICD-10-CM | POA: Diagnosis not present

## 2020-05-21 DIAGNOSIS — E559 Vitamin D deficiency, unspecified: Secondary | ICD-10-CM | POA: Diagnosis not present

## 2020-05-21 DIAGNOSIS — E785 Hyperlipidemia, unspecified: Secondary | ICD-10-CM | POA: Diagnosis not present

## 2020-05-27 DIAGNOSIS — E538 Deficiency of other specified B group vitamins: Secondary | ICD-10-CM | POA: Diagnosis not present

## 2020-05-27 DIAGNOSIS — I700 Atherosclerosis of aorta: Secondary | ICD-10-CM | POA: Diagnosis not present

## 2020-05-27 DIAGNOSIS — J449 Chronic obstructive pulmonary disease, unspecified: Secondary | ICD-10-CM | POA: Diagnosis not present

## 2020-05-27 DIAGNOSIS — E039 Hypothyroidism, unspecified: Secondary | ICD-10-CM | POA: Diagnosis not present

## 2020-05-27 DIAGNOSIS — E559 Vitamin D deficiency, unspecified: Secondary | ICD-10-CM | POA: Diagnosis not present

## 2020-05-27 DIAGNOSIS — M8588 Other specified disorders of bone density and structure, other site: Secondary | ICD-10-CM | POA: Diagnosis not present

## 2020-05-27 DIAGNOSIS — Z23 Encounter for immunization: Secondary | ICD-10-CM | POA: Diagnosis not present

## 2020-05-27 DIAGNOSIS — Z0000 Encounter for general adult medical examination without abnormal findings: Secondary | ICD-10-CM | POA: Diagnosis not present

## 2020-05-27 DIAGNOSIS — E785 Hyperlipidemia, unspecified: Secondary | ICD-10-CM | POA: Diagnosis not present

## 2020-05-27 DIAGNOSIS — T23262A Burn of second degree of back of left hand, initial encounter: Secondary | ICD-10-CM | POA: Diagnosis not present

## 2020-05-31 ENCOUNTER — Other Ambulatory Visit: Payer: Self-pay | Admitting: Family Medicine

## 2020-05-31 DIAGNOSIS — M8580 Other specified disorders of bone density and structure, unspecified site: Secondary | ICD-10-CM

## 2020-06-02 ENCOUNTER — Ambulatory Visit
Admission: RE | Admit: 2020-06-02 | Discharge: 2020-06-02 | Disposition: A | Discharge status: Home / Self Care | Payer: Medicare HMO | Source: Ambulatory Visit | Attending: Family Medicine | Admitting: Family Medicine

## 2020-06-02 ENCOUNTER — Other Ambulatory Visit: Payer: Self-pay

## 2020-06-02 DIAGNOSIS — R69 Illness, unspecified: Secondary | ICD-10-CM | POA: Diagnosis not present

## 2020-06-02 DIAGNOSIS — M810 Age-related osteoporosis without current pathological fracture: Secondary | ICD-10-CM | POA: Diagnosis not present

## 2020-06-02 DIAGNOSIS — M8580 Other specified disorders of bone density and structure, unspecified site: Secondary | ICD-10-CM

## 2020-06-02 DIAGNOSIS — M8589 Other specified disorders of bone density and structure, multiple sites: Secondary | ICD-10-CM | POA: Diagnosis not present

## 2020-06-02 DIAGNOSIS — E785 Hyperlipidemia, unspecified: Secondary | ICD-10-CM | POA: Diagnosis not present

## 2020-06-02 DIAGNOSIS — Z780 Asymptomatic menopausal state: Secondary | ICD-10-CM | POA: Diagnosis not present

## 2020-06-02 DIAGNOSIS — J449 Chronic obstructive pulmonary disease, unspecified: Secondary | ICD-10-CM | POA: Diagnosis not present

## 2020-06-02 DIAGNOSIS — E039 Hypothyroidism, unspecified: Secondary | ICD-10-CM | POA: Diagnosis not present

## 2020-06-03 ENCOUNTER — Other Ambulatory Visit: Payer: Self-pay | Admitting: Psychiatry

## 2020-06-03 DIAGNOSIS — F4310 Post-traumatic stress disorder, unspecified: Secondary | ICD-10-CM

## 2020-06-03 DIAGNOSIS — F411 Generalized anxiety disorder: Secondary | ICD-10-CM

## 2020-06-03 DIAGNOSIS — F4001 Agoraphobia with panic disorder: Secondary | ICD-10-CM

## 2020-06-04 DIAGNOSIS — R69 Illness, unspecified: Secondary | ICD-10-CM | POA: Diagnosis not present

## 2020-06-04 NOTE — Telephone Encounter (Signed)
Last office visit he increased this to 1.5 tablets daily. Please review

## 2020-06-04 NOTE — Telephone Encounter (Signed)
review 

## 2020-06-07 ENCOUNTER — Other Ambulatory Visit: Payer: Self-pay

## 2020-06-07 ENCOUNTER — Ambulatory Visit (INDEPENDENT_AMBULATORY_CARE_PROVIDER_SITE_OTHER): Payer: Medicare HMO | Admitting: Internal Medicine

## 2020-06-07 ENCOUNTER — Encounter: Payer: Self-pay | Admitting: Internal Medicine

## 2020-06-07 VITALS — BP 110/78 | HR 70 | Ht 67.0 in | Wt 128.0 lb

## 2020-06-07 DIAGNOSIS — R0600 Dyspnea, unspecified: Secondary | ICD-10-CM | POA: Diagnosis not present

## 2020-06-07 DIAGNOSIS — E785 Hyperlipidemia, unspecified: Secondary | ICD-10-CM

## 2020-06-07 DIAGNOSIS — Q231 Congenital insufficiency of aortic valve: Secondary | ICD-10-CM | POA: Diagnosis not present

## 2020-06-07 DIAGNOSIS — Z79899 Other long term (current) drug therapy: Secondary | ICD-10-CM

## 2020-06-07 DIAGNOSIS — R0609 Other forms of dyspnea: Secondary | ICD-10-CM

## 2020-06-07 DIAGNOSIS — R55 Syncope and collapse: Secondary | ICD-10-CM | POA: Diagnosis not present

## 2020-06-07 DIAGNOSIS — Q2381 Bicuspid aortic valve: Secondary | ICD-10-CM

## 2020-06-07 NOTE — Patient Instructions (Signed)
Medication Instructions:  No Changes In Medications at this time.  *If you need a refill on your cardiac medications before your next appointment, please call your pharmacy*  Lab Work: None Ordered At This Time.  If you have labs (blood work) drawn today and your tests are completely normal, you will receive your results only by: . MyChart Message (if you have MyChart) OR . A paper copy in the mail If you have any lab test that is abnormal or we need to change your treatment, we will call you to review the results.  Testing/Procedures: None Ordered At This Time.   Follow-Up: At [facility], you and your health needs are our priority.  As part of our continuing mission to provide you with exceptional heart care, we have created designated Provider Care Teams.  These Care Teams include your primary Cardiologist (physician) and Advanced Practice Providers (APPs -  Physician Assistants and Nurse Practitioners) who all work together to provide you with the care you need, when you need it.  Your next appointment:   6 month(s)  The format for your next appointment:   In Person  Provider:   Gayatri Acharya, MD   
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
## 2020-06-10 DIAGNOSIS — M810 Age-related osteoporosis without current pathological fracture: Secondary | ICD-10-CM | POA: Diagnosis not present

## 2020-06-30 DIAGNOSIS — M25511 Pain in right shoulder: Secondary | ICD-10-CM | POA: Diagnosis not present

## 2020-07-10 ENCOUNTER — Other Ambulatory Visit: Payer: Self-pay | Admitting: Psychiatry

## 2020-07-10 DIAGNOSIS — F4001 Agoraphobia with panic disorder: Secondary | ICD-10-CM

## 2020-07-10 DIAGNOSIS — F3181 Bipolar II disorder: Secondary | ICD-10-CM

## 2020-07-12 NOTE — Telephone Encounter (Signed)
Scheduled 11/30 now

## 2020-08-05 DIAGNOSIS — E039 Hypothyroidism, unspecified: Secondary | ICD-10-CM | POA: Diagnosis not present

## 2020-08-05 DIAGNOSIS — E785 Hyperlipidemia, unspecified: Secondary | ICD-10-CM | POA: Diagnosis not present

## 2020-08-05 DIAGNOSIS — M810 Age-related osteoporosis without current pathological fracture: Secondary | ICD-10-CM | POA: Diagnosis not present

## 2020-08-05 DIAGNOSIS — J449 Chronic obstructive pulmonary disease, unspecified: Secondary | ICD-10-CM | POA: Diagnosis not present

## 2020-08-05 DIAGNOSIS — R69 Illness, unspecified: Secondary | ICD-10-CM | POA: Diagnosis not present

## 2020-08-05 DIAGNOSIS — K219 Gastro-esophageal reflux disease without esophagitis: Secondary | ICD-10-CM | POA: Diagnosis not present

## 2020-08-10 ENCOUNTER — Other Ambulatory Visit: Payer: Self-pay | Admitting: Psychiatry

## 2020-08-10 DIAGNOSIS — F4001 Agoraphobia with panic disorder: Secondary | ICD-10-CM

## 2020-08-10 DIAGNOSIS — F4310 Post-traumatic stress disorder, unspecified: Secondary | ICD-10-CM

## 2020-08-10 DIAGNOSIS — F411 Generalized anxiety disorder: Secondary | ICD-10-CM

## 2020-08-17 ENCOUNTER — Encounter: Payer: Self-pay | Admitting: Psychiatry

## 2020-08-17 ENCOUNTER — Telehealth (INDEPENDENT_AMBULATORY_CARE_PROVIDER_SITE_OTHER): Payer: Medicare HMO | Admitting: Psychiatry

## 2020-08-17 DIAGNOSIS — F4001 Agoraphobia with panic disorder: Secondary | ICD-10-CM | POA: Diagnosis not present

## 2020-08-17 DIAGNOSIS — F3181 Bipolar II disorder: Secondary | ICD-10-CM

## 2020-08-17 DIAGNOSIS — F4310 Post-traumatic stress disorder, unspecified: Secondary | ICD-10-CM | POA: Diagnosis not present

## 2020-08-17 DIAGNOSIS — R69 Illness, unspecified: Secondary | ICD-10-CM | POA: Diagnosis not present

## 2020-08-17 DIAGNOSIS — F411 Generalized anxiety disorder: Secondary | ICD-10-CM

## 2020-08-17 MED ORDER — CITALOPRAM HYDROBROMIDE 20 MG PO TABS: 30.0000 mg | ORAL_TABLET | Freq: Every day | ORAL | 1 refills | Status: DC

## 2020-08-17 MED ORDER — ALPRAZOLAM 0.5 MG PO TABS: 0.5000 mg | ORAL_TABLET | Freq: Four times a day (QID) | ORAL | 5 refills | Status: DC

## 2020-08-17 MED ORDER — LURASIDONE HCL 120 MG PO TABS: 120.0000 mg | ORAL_TABLET | Freq: Every day | ORAL | 5 refills | Status: DC
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
## 2020-08-20 DIAGNOSIS — E039 Hypothyroidism, unspecified: Secondary | ICD-10-CM | POA: Diagnosis not present

## 2020-08-20 DIAGNOSIS — R69 Illness, unspecified: Secondary | ICD-10-CM | POA: Diagnosis not present

## 2020-08-20 DIAGNOSIS — E785 Hyperlipidemia, unspecified: Secondary | ICD-10-CM | POA: Diagnosis not present

## 2020-08-20 DIAGNOSIS — J449 Chronic obstructive pulmonary disease, unspecified: Secondary | ICD-10-CM | POA: Diagnosis not present

## 2020-08-20 DIAGNOSIS — M810 Age-related osteoporosis without current pathological fracture: Secondary | ICD-10-CM | POA: Diagnosis not present

## 2020-08-20 DIAGNOSIS — K219 Gastro-esophageal reflux disease without esophagitis: Secondary | ICD-10-CM | POA: Diagnosis not present

## 2020-08-29 DIAGNOSIS — R69 Illness, unspecified: Secondary | ICD-10-CM | POA: Diagnosis not present

## 2020-09-28 ENCOUNTER — Ambulatory Visit: Payer: Medicare HMO | Admitting: Psychiatry

## 2020-09-29 ENCOUNTER — Other Ambulatory Visit: Payer: Self-pay

## 2020-09-29 ENCOUNTER — Ambulatory Visit (HOSPITAL_COMMUNITY): Payer: Medicare HMO | Attending: Cardiology

## 2020-09-29 DIAGNOSIS — R55 Syncope and collapse: Secondary | ICD-10-CM | POA: Diagnosis not present

## 2020-09-29 LAB — ECHOCARDIOGRAM COMPLETE
AR max vel: 1.38 cm2
AV Area VTI: 1.48 cm2
AV Area mean vel: 1.28 cm2
AV Mean grad: 11.8 mmHg
AV Peak grad: 21.6 mmHg
Ao pk vel: 2.33 m/s
Area-P 1/2: 3.54 cm2
S' Lateral: 2.8 cm

## 2020-10-06 DIAGNOSIS — K219 Gastro-esophageal reflux disease without esophagitis: Secondary | ICD-10-CM | POA: Diagnosis not present

## 2020-10-06 DIAGNOSIS — M810 Age-related osteoporosis without current pathological fracture: Secondary | ICD-10-CM | POA: Diagnosis not present

## 2020-10-06 DIAGNOSIS — E785 Hyperlipidemia, unspecified: Secondary | ICD-10-CM | POA: Diagnosis not present

## 2020-10-06 DIAGNOSIS — R69 Illness, unspecified: Secondary | ICD-10-CM | POA: Diagnosis not present

## 2020-10-06 DIAGNOSIS — J449 Chronic obstructive pulmonary disease, unspecified: Secondary | ICD-10-CM | POA: Diagnosis not present

## 2020-10-06 DIAGNOSIS — E039 Hypothyroidism, unspecified: Secondary | ICD-10-CM | POA: Diagnosis not present

## 2020-11-24 DIAGNOSIS — E039 Hypothyroidism, unspecified: Secondary | ICD-10-CM | POA: Diagnosis not present

## 2020-11-24 DIAGNOSIS — I700 Atherosclerosis of aorta: Secondary | ICD-10-CM | POA: Diagnosis not present

## 2020-11-24 DIAGNOSIS — R69 Illness, unspecified: Secondary | ICD-10-CM | POA: Diagnosis not present

## 2020-11-24 DIAGNOSIS — J449 Chronic obstructive pulmonary disease, unspecified: Secondary | ICD-10-CM | POA: Diagnosis not present

## 2020-11-24 DIAGNOSIS — E441 Mild protein-calorie malnutrition: Secondary | ICD-10-CM | POA: Diagnosis not present

## 2020-11-24 DIAGNOSIS — R197 Diarrhea, unspecified: Secondary | ICD-10-CM | POA: Diagnosis not present

## 2020-11-25 DIAGNOSIS — R197 Diarrhea, unspecified: Secondary | ICD-10-CM | POA: Diagnosis not present

## 2020-11-29 ENCOUNTER — Ambulatory Visit (INDEPENDENT_AMBULATORY_CARE_PROVIDER_SITE_OTHER): Payer: Medicare HMO | Admitting: Internal Medicine

## 2020-11-29 ENCOUNTER — Other Ambulatory Visit: Payer: Self-pay

## 2020-11-29 ENCOUNTER — Encounter: Payer: Self-pay | Admitting: Internal Medicine

## 2020-11-29 VITALS — BP 110/64 | HR 58 | Ht 67.0 in | Wt 110.8 lb

## 2020-11-29 DIAGNOSIS — Q231 Congenital insufficiency of aortic valve: Secondary | ICD-10-CM | POA: Diagnosis not present

## 2020-11-29 DIAGNOSIS — E785 Hyperlipidemia, unspecified: Secondary | ICD-10-CM

## 2020-11-29 DIAGNOSIS — I77810 Thoracic aortic ectasia: Secondary | ICD-10-CM | POA: Diagnosis not present

## 2020-11-29 DIAGNOSIS — Z79899 Other long term (current) drug therapy: Secondary | ICD-10-CM

## 2020-11-29 DIAGNOSIS — R55 Syncope and collapse: Secondary | ICD-10-CM
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
## 2020-11-29 NOTE — Patient Instructions (Addendum)

## 2020-12-01 DIAGNOSIS — R197 Diarrhea, unspecified: Secondary | ICD-10-CM | POA: Diagnosis not present

## 2020-12-01 DIAGNOSIS — R634 Abnormal weight loss: Secondary | ICD-10-CM | POA: Diagnosis not present

## 2020-12-01 DIAGNOSIS — R1310 Dysphagia, unspecified: Secondary | ICD-10-CM | POA: Diagnosis not present

## 2020-12-02 ENCOUNTER — Other Ambulatory Visit: Payer: Self-pay | Admitting: Gastroenterology

## 2020-12-02 DIAGNOSIS — R634 Abnormal weight loss: Secondary | ICD-10-CM

## 2020-12-03 DIAGNOSIS — R197 Diarrhea, unspecified: Secondary | ICD-10-CM | POA: Diagnosis not present

## 2020-12-07 ENCOUNTER — Telehealth: Payer: Self-pay

## 2020-12-07 MED ORDER — DIPHENHYDRAMINE HCL 50 MG PO TABS: 50.0000 mg | ORAL_TABLET | Freq: Once | ORAL | 0 refills | Status: DC

## 2020-12-07 MED ORDER — PREDNISONE 50 MG PO TABS: ORAL_TABLET | ORAL | 0 refills | Status: DC

## 2020-12-07 NOTE — Telephone Encounter (Signed)
Phone call to patient to review instructions for 13 hr prep for CT w/ contrast on 12/17/20 at 9:40 AM. Prescription called into [facility]. Pt aware and verbalized understanding of instructions.    Pt to take 50 mg of prednisone on 12/16/20 at 8:40 PM, 50 mg of prednisone on 12/17/20 at 2:40 AM, and 50 mg of prednisone on 12/17/20 at 8:40 AM . Pt is also to take 50 mg of benadryl on 12/17/20 at 8:40 AM. Please call [phone] with any questions.

## 2020-12-09 DIAGNOSIS — M810 Age-related osteoporosis without current pathological fracture: Secondary | ICD-10-CM | POA: Diagnosis not present

## 2020-12-17 ENCOUNTER — Ambulatory Visit
Admission: RE | Admit: 2020-12-17 | Discharge: 2020-12-17 | Disposition: A | Discharge status: Home / Self Care | Payer: Medicare HMO | Source: Ambulatory Visit | Attending: Gastroenterology | Admitting: Gastroenterology

## 2020-12-17 ENCOUNTER — Other Ambulatory Visit: Payer: Self-pay

## 2020-12-17 DIAGNOSIS — K5730 Diverticulosis of large intestine without perforation or abscess without bleeding: Secondary | ICD-10-CM | POA: Diagnosis not present

## 2020-12-17 DIAGNOSIS — R634 Abnormal weight loss: Secondary | ICD-10-CM

## 2020-12-17 MED ORDER — IOPAMIDOL (ISOVUE-300) INJECTION 61%
100.0000 mL | Freq: Once | INTRAVENOUS | Status: AC | PRN
  Administered 2020-12-17: 100 mL via INTRAVENOUS

## 2020-12-27 DIAGNOSIS — D123 Benign neoplasm of transverse colon: Secondary | ICD-10-CM | POA: Diagnosis not present

## 2020-12-27 DIAGNOSIS — K224 Dyskinesia of esophagus: Secondary | ICD-10-CM | POA: Diagnosis not present

## 2020-12-27 DIAGNOSIS — R197 Diarrhea, unspecified: Secondary | ICD-10-CM | POA: Diagnosis not present

## 2020-12-27 DIAGNOSIS — R1310 Dysphagia, unspecified: Secondary | ICD-10-CM | POA: Diagnosis not present

## 2020-12-27 DIAGNOSIS — R634 Abnormal weight loss: Secondary | ICD-10-CM | POA: Diagnosis not present

## 2020-12-29 DIAGNOSIS — D123 Benign neoplasm of transverse colon: Secondary | ICD-10-CM | POA: Diagnosis not present

## 2020-12-29 DIAGNOSIS — R634 Abnormal weight loss: Secondary | ICD-10-CM | POA: Diagnosis not present

## 2021-01-10 DIAGNOSIS — K219 Gastro-esophageal reflux disease without esophagitis: Secondary | ICD-10-CM | POA: Diagnosis not present

## 2021-01-10 DIAGNOSIS — R634 Abnormal weight loss: Secondary | ICD-10-CM | POA: Diagnosis not present

## 2021-01-10 DIAGNOSIS — R1310 Dysphagia, unspecified: Secondary | ICD-10-CM | POA: Diagnosis not present

## 2021-01-10 DIAGNOSIS — R197 Diarrhea, unspecified: Secondary | ICD-10-CM | POA: Diagnosis not present

## 2021-02-01 DIAGNOSIS — H04123 Dry eye syndrome of bilateral lacrimal glands: Secondary | ICD-10-CM | POA: Diagnosis not present

## 2021-02-01 DIAGNOSIS — Z961 Presence of intraocular lens: Secondary | ICD-10-CM | POA: Diagnosis not present

## 2021-02-03 DIAGNOSIS — E039 Hypothyroidism, unspecified: Secondary | ICD-10-CM | POA: Diagnosis not present

## 2021-02-03 DIAGNOSIS — E785 Hyperlipidemia, unspecified: Secondary | ICD-10-CM | POA: Diagnosis not present

## 2021-02-03 DIAGNOSIS — K219 Gastro-esophageal reflux disease without esophagitis: Secondary | ICD-10-CM | POA: Diagnosis not present

## 2021-02-03 DIAGNOSIS — M810 Age-related osteoporosis without current pathological fracture: Secondary | ICD-10-CM | POA: Diagnosis not present

## 2021-02-03 DIAGNOSIS — J449 Chronic obstructive pulmonary disease, unspecified: Secondary | ICD-10-CM | POA: Diagnosis not present

## 2021-02-03 DIAGNOSIS — R69 Illness, unspecified: Secondary | ICD-10-CM | POA: Diagnosis not present

## 2021-02-09 ENCOUNTER — Other Ambulatory Visit: Payer: Self-pay | Admitting: Psychiatry

## 2021-02-09 DIAGNOSIS — F411 Generalized anxiety disorder: Secondary | ICD-10-CM

## 2021-02-09 DIAGNOSIS — F4001 Agoraphobia with panic disorder: Secondary | ICD-10-CM

## 2021-02-09 DIAGNOSIS — F4310 Post-traumatic stress disorder, unspecified: Secondary | ICD-10-CM

## 2021-02-23 DIAGNOSIS — M4316 Spondylolisthesis, lumbar region: Secondary | ICD-10-CM | POA: Diagnosis not present

## 2021-03-01 DIAGNOSIS — M545 Low back pain, unspecified: Secondary | ICD-10-CM | POA: Diagnosis not present

## 2021-03-01 DIAGNOSIS — M4316 Spondylolisthesis, lumbar region: Secondary | ICD-10-CM | POA: Diagnosis not present

## 2021-03-02 DIAGNOSIS — M4316 Spondylolisthesis, lumbar region: Secondary | ICD-10-CM | POA: Diagnosis not present

## 2021-03-12 ENCOUNTER — Other Ambulatory Visit: Payer: Self-pay | Admitting: Psychiatry

## 2021-03-12 DIAGNOSIS — F4001 Agoraphobia with panic disorder: Secondary | ICD-10-CM

## 2021-03-12 DIAGNOSIS — F3181 Bipolar II disorder: Secondary | ICD-10-CM

## 2021-04-01 ENCOUNTER — Other Ambulatory Visit: Payer: Self-pay | Admitting: Internal Medicine

## 2021-04-06 ENCOUNTER — Other Ambulatory Visit: Payer: Self-pay | Admitting: Psychiatry

## 2021-04-06 DIAGNOSIS — F4310 Post-traumatic stress disorder, unspecified: Secondary | ICD-10-CM

## 2021-04-06 DIAGNOSIS — F3181 Bipolar II disorder: Secondary | ICD-10-CM

## 2021-04-06 DIAGNOSIS — F411 Generalized anxiety disorder: Secondary | ICD-10-CM

## 2021-04-06 DIAGNOSIS — F4001 Agoraphobia with panic disorder: Secondary | ICD-10-CM

## 2021-04-08 NOTE — Telephone Encounter (Signed)
Please get scheduled for 6 month f/u, overdue with Dr. Clovis Pu

## 2021-04-08 NOTE — Telephone Encounter (Signed)
LM on Pt's VM follow up over due. Call to schedule.

## 2021-04-11 ENCOUNTER — Telehealth: Payer: Self-pay | Admitting: Internal Medicine

## 2021-04-11 NOTE — Telephone Encounter (Signed)
*  STAT* If patient is at the pharmacy, call can be transferred to refill team.   1. Which medications need to be refilled? (please list name of each medication and dose if known)  atorvastatin (LIPITOR) 80 MG tablet   2. Which pharmacy/location (including street and city if local pharmacy) is medication to be sent to?  [id number] [place] [place] - [address]. Do they need a 30 day or 90 day supply? [address]

## 2021-04-11 NOTE — Telephone Encounter (Signed)
This medication can not be refilled because it ended 07/19/20 by Dr. Margaretann Loveless

## 2021-04-18 DIAGNOSIS — E039 Hypothyroidism, unspecified: Secondary | ICD-10-CM | POA: Diagnosis not present

## 2021-04-18 DIAGNOSIS — J449 Chronic obstructive pulmonary disease, unspecified: Secondary | ICD-10-CM | POA: Diagnosis not present

## 2021-04-18 DIAGNOSIS — E785 Hyperlipidemia, unspecified: Secondary | ICD-10-CM | POA: Diagnosis not present

## 2021-04-18 DIAGNOSIS — K219 Gastro-esophageal reflux disease without esophagitis: Secondary | ICD-10-CM | POA: Diagnosis not present

## 2021-04-18 DIAGNOSIS — E538 Deficiency of other specified B group vitamins: Secondary | ICD-10-CM | POA: Diagnosis not present

## 2021-04-18 DIAGNOSIS — R69 Illness, unspecified: Secondary | ICD-10-CM | POA: Diagnosis not present

## 2021-04-18 DIAGNOSIS — E441 Mild protein-calorie malnutrition: Secondary | ICD-10-CM | POA: Diagnosis not present

## 2021-04-18 DIAGNOSIS — E559 Vitamin D deficiency, unspecified: Secondary | ICD-10-CM | POA: Diagnosis not present

## 2021-05-04 ENCOUNTER — Other Ambulatory Visit: Payer: Self-pay

## 2021-05-04 ENCOUNTER — Ambulatory Visit (INDEPENDENT_AMBULATORY_CARE_PROVIDER_SITE_OTHER): Payer: Medicare HMO | Admitting: Psychiatry

## 2021-05-04 ENCOUNTER — Encounter: Payer: Self-pay | Admitting: Psychiatry

## 2021-05-04 DIAGNOSIS — F411 Generalized anxiety disorder: Secondary | ICD-10-CM | POA: Diagnosis not present

## 2021-05-04 DIAGNOSIS — F3181 Bipolar II disorder: Secondary | ICD-10-CM

## 2021-05-04 DIAGNOSIS — F4310 Post-traumatic stress disorder, unspecified: Secondary | ICD-10-CM | POA: Diagnosis not present

## 2021-05-04 DIAGNOSIS — R69 Illness, unspecified: Secondary | ICD-10-CM | POA: Diagnosis not present

## 2021-05-04 DIAGNOSIS — F4001 Agoraphobia with panic disorder: Secondary | ICD-10-CM

## 2021-05-04 MED ORDER — LURASIDONE HCL 120 MG PO TABS: 120.0000 mg | ORAL_TABLET | Freq: Every day | ORAL | 5 refills | Status: DC

## 2021-05-04 MED ORDER — CITALOPRAM HYDROBROMIDE 20 MG PO TABS: 30.0000 mg | ORAL_TABLET | Freq: Every day | ORAL | 1 refills | Status: DC
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
## 2021-05-12 ENCOUNTER — Other Ambulatory Visit: Payer: Self-pay | Admitting: *Deleted

## 2021-05-12 DIAGNOSIS — F17210 Nicotine dependence, cigarettes, uncomplicated: Secondary | ICD-10-CM

## 2021-05-12 DIAGNOSIS — Z87891 Personal history of nicotine dependence: Secondary | ICD-10-CM

## 2021-05-17 ENCOUNTER — Other Ambulatory Visit: Payer: Self-pay

## 2021-05-17 ENCOUNTER — Ambulatory Visit (INDEPENDENT_AMBULATORY_CARE_PROVIDER_SITE_OTHER)
Admission: RE | Admit: 2021-05-17 | Discharge: 2021-05-17 | Disposition: A | Discharge status: Home / Self Care | Payer: Medicare HMO | Source: Ambulatory Visit | Attending: Family Medicine | Admitting: Family Medicine

## 2021-05-17 DIAGNOSIS — Z87891 Personal history of nicotine dependence: Secondary | ICD-10-CM | POA: Diagnosis not present

## 2021-05-17 DIAGNOSIS — F17210 Nicotine dependence, cigarettes, uncomplicated: Secondary | ICD-10-CM

## 2021-05-17 DIAGNOSIS — R69 Illness, unspecified: Secondary | ICD-10-CM | POA: Diagnosis not present

## 2021-05-26 NOTE — Progress Notes (Signed)
Please call patient and let them  know their  low dose Ct was read as a Lung RADS 2: nodules that are benign in appearance and behavior with a very low likelihood of becoming a clinically active cancer due to size or lack of growth. Recommendation per radiology is for a repeat LDCT in 12 months. .Please let them  know we will order and schedule their  annual screening scan for 05/2022. Please let them  know there was notation of CAD on their  scan.  Please remind the patient  that this is a non-gated exam therefore degree or severity of disease  cannot be determined. Please have them  follow up with their PCP regarding potential risk factor modification, dietary therapy or pharmacologic therapy if clinically indicated. Pt.  is  currently on statin therapy. Please place order for annual  screening scan for  05/2022 and fax results to PCP. Thanks so much.  + CAD, + statin, followed by cardiology

## 2021-05-27 ENCOUNTER — Other Ambulatory Visit: Payer: Self-pay | Admitting: *Deleted

## 2021-05-27 DIAGNOSIS — Z87891 Personal history of nicotine dependence: Secondary | ICD-10-CM

## 2021-05-27 DIAGNOSIS — F17210 Nicotine dependence, cigarettes, uncomplicated: Secondary | ICD-10-CM

## 2021-05-30 DIAGNOSIS — E538 Deficiency of other specified B group vitamins: Secondary | ICD-10-CM | POA: Diagnosis not present

## 2021-05-30 DIAGNOSIS — J449 Chronic obstructive pulmonary disease, unspecified: Secondary | ICD-10-CM | POA: Diagnosis not present

## 2021-05-30 DIAGNOSIS — I700 Atherosclerosis of aorta: Secondary | ICD-10-CM | POA: Diagnosis not present

## 2021-05-30 DIAGNOSIS — Z23 Encounter for immunization: Secondary | ICD-10-CM | POA: Diagnosis not present

## 2021-05-30 DIAGNOSIS — E441 Mild protein-calorie malnutrition: Secondary | ICD-10-CM | POA: Diagnosis not present

## 2021-05-30 DIAGNOSIS — K219 Gastro-esophageal reflux disease without esophagitis: Secondary | ICD-10-CM | POA: Diagnosis not present

## 2021-05-30 DIAGNOSIS — E785 Hyperlipidemia, unspecified: Secondary | ICD-10-CM | POA: Diagnosis not present

## 2021-05-30 DIAGNOSIS — E039 Hypothyroidism, unspecified: Secondary | ICD-10-CM | POA: Diagnosis not present

## 2021-05-30 DIAGNOSIS — M810 Age-related osteoporosis without current pathological fracture: Secondary | ICD-10-CM | POA: Diagnosis not present

## 2021-05-30 DIAGNOSIS — Z0000 Encounter for general adult medical examination without abnormal findings: Secondary | ICD-10-CM | POA: Diagnosis not present

## 2021-05-30 DIAGNOSIS — E559 Vitamin D deficiency, unspecified: Secondary | ICD-10-CM | POA: Diagnosis not present

## 2021-06-06 DIAGNOSIS — E785 Hyperlipidemia, unspecified: Secondary | ICD-10-CM | POA: Diagnosis not present

## 2021-06-06 DIAGNOSIS — R69 Illness, unspecified: Secondary | ICD-10-CM | POA: Diagnosis not present

## 2021-06-06 DIAGNOSIS — J449 Chronic obstructive pulmonary disease, unspecified: Secondary | ICD-10-CM | POA: Diagnosis not present

## 2021-06-06 DIAGNOSIS — K219 Gastro-esophageal reflux disease without esophagitis: Secondary | ICD-10-CM | POA: Diagnosis not present

## 2021-06-06 DIAGNOSIS — E039 Hypothyroidism, unspecified: Secondary | ICD-10-CM | POA: Diagnosis not present

## 2021-06-06 DIAGNOSIS — M810 Age-related osteoporosis without current pathological fracture: Secondary | ICD-10-CM | POA: Diagnosis not present

## 2021-06-15 DIAGNOSIS — M810 Age-related osteoporosis without current pathological fracture: Secondary | ICD-10-CM | POA: Diagnosis not present

## 2021-06-23 ENCOUNTER — Ambulatory Visit: Payer: Medicare HMO | Admitting: Physician Assistant

## 2021-06-23 ENCOUNTER — Other Ambulatory Visit: Payer: Self-pay

## 2021-06-23 DIAGNOSIS — Z1283 Encounter for screening for malignant neoplasm of skin: Secondary | ICD-10-CM | POA: Diagnosis not present

## 2021-06-23 DIAGNOSIS — L570 Actinic keratosis: Secondary | ICD-10-CM

## 2021-06-28 DIAGNOSIS — M810 Age-related osteoporosis without current pathological fracture: Secondary | ICD-10-CM | POA: Diagnosis not present

## 2021-06-28 DIAGNOSIS — E785 Hyperlipidemia, unspecified: Secondary | ICD-10-CM | POA: Diagnosis not present

## 2021-06-28 DIAGNOSIS — K219 Gastro-esophageal reflux disease without esophagitis: Secondary | ICD-10-CM | POA: Diagnosis not present

## 2021-06-28 DIAGNOSIS — J449 Chronic obstructive pulmonary disease, unspecified: Secondary | ICD-10-CM | POA: Diagnosis not present

## 2021-06-28 DIAGNOSIS — R69 Illness, unspecified: Secondary | ICD-10-CM | POA: Diagnosis not present

## 2021-06-28 DIAGNOSIS — E039 Hypothyroidism, unspecified: Secondary | ICD-10-CM | POA: Diagnosis not present

## 2021-06-28 DIAGNOSIS — J0110 Acute frontal sinusitis, unspecified: Secondary | ICD-10-CM | POA: Diagnosis not present

## 2021-07-01 ENCOUNTER — Encounter: Payer: Self-pay | Admitting: Physician Assistant

## 2021-07-01 NOTE — Progress Notes (Signed)
   New Patient   Subjective  [name] is a 65 y.o. female who presents for the following: Skin Problem (Here to have some lesions on her legs and face looked at. They are darker flat areas patient wants removed. Said we froze last time. ).   The following portions of the chart were reviewed this encounter and updated as appropriate:  Tobacco  Allergies  Meds  Problems  Med Hx  Surg Hx  Fam Hx      Objective  Well appearing patient in no apparent distress; mood and affect are within normal limits.  A full examination was performed including scalp, head, eyes, ears, nose, lips, neck, chest, axillae, abdomen, back, buttocks, bilateral upper extremities, bilateral lower extremities, hands, feet, fingers, toes, fingernails, and toenails. All findings within normal limits unless otherwise noted below.  Right Upper Cutaneous Lip Erythematous patches with gritty scale.   Assessment & Plan  AK (actinic keratosis) Right Upper Cutaneous Lip  Destruction of lesion - Right Upper Cutaneous Lip Complexity: simple   Destruction method: cryotherapy   Informed consent: discussed and consent obtained   Timeout:  patient name, date of birth, surgical site, and procedure verified Lesion destroyed using liquid nitrogen: Yes   Cryotherapy cycles:  3 Outcome: patient tolerated procedure well with no complications     No atypical nevi noted at the time of the visit.   I, Denee Boeder, PA-C, have reviewed all documentation's for this visit.  The documentation on 07/01/21 for the exam, diagnosis, procedures and orders are all accurate and complete.

## 2021-07-04 ENCOUNTER — Other Ambulatory Visit: Payer: Self-pay | Admitting: Psychiatry

## 2021-07-04 DIAGNOSIS — F411 Generalized anxiety disorder: Secondary | ICD-10-CM

## 2021-07-04 DIAGNOSIS — F4001 Agoraphobia with panic disorder: Secondary | ICD-10-CM

## 2021-07-04 DIAGNOSIS — F4310 Post-traumatic stress disorder, unspecified: Secondary | ICD-10-CM

## 2021-07-04 DIAGNOSIS — F3181 Bipolar II disorder: Secondary | ICD-10-CM

## 2021-07-05 ENCOUNTER — Ambulatory Visit: Payer: Self-pay | Admitting: Psychiatry

## 2021-07-07 DIAGNOSIS — R519 Headache, unspecified: Secondary | ICD-10-CM | POA: Diagnosis not present

## 2021-07-08 ENCOUNTER — Other Ambulatory Visit: Payer: Self-pay

## 2021-07-08 ENCOUNTER — Emergency Department (HOSPITAL_BASED_OUTPATIENT_CLINIC_OR_DEPARTMENT_OTHER)
Admission: EM | Admit: 2021-07-08 | Discharge: 2021-07-08 | Disposition: A | Discharge status: Home / Self Care | Payer: Medicare HMO | Attending: Emergency Medicine | Admitting: Emergency Medicine

## 2021-07-08 ENCOUNTER — Emergency Department (HOSPITAL_BASED_OUTPATIENT_CLINIC_OR_DEPARTMENT_OTHER): Payer: Medicare HMO

## 2021-07-08 ENCOUNTER — Encounter (HOSPITAL_BASED_OUTPATIENT_CLINIC_OR_DEPARTMENT_OTHER): Payer: Self-pay | Admitting: Emergency Medicine

## 2021-07-08 DIAGNOSIS — F17210 Nicotine dependence, cigarettes, uncomplicated: Secondary | ICD-10-CM | POA: Insufficient documentation

## 2021-07-08 DIAGNOSIS — J449 Chronic obstructive pulmonary disease, unspecified: Secondary | ICD-10-CM | POA: Insufficient documentation

## 2021-07-08 DIAGNOSIS — R69 Illness, unspecified: Secondary | ICD-10-CM | POA: Diagnosis not present

## 2021-07-08 DIAGNOSIS — R519 Headache, unspecified: Secondary | ICD-10-CM | POA: Diagnosis not present

## 2021-07-08 DIAGNOSIS — H5711 Ocular pain, right eye: Secondary | ICD-10-CM | POA: Insufficient documentation

## 2021-07-08 DIAGNOSIS — Z7982 Long term (current) use of aspirin: Secondary | ICD-10-CM | POA: Insufficient documentation

## 2021-07-08 DIAGNOSIS — Z79899 Other long term (current) drug therapy: Secondary | ICD-10-CM | POA: Insufficient documentation

## 2021-07-08 DIAGNOSIS — E039 Hypothyroidism, unspecified: Secondary | ICD-10-CM | POA: Insufficient documentation

## 2021-07-08 LAB — BASIC METABOLIC PANEL
Anion gap: 6 (ref 5–15)
BUN: 10 mg/dL (ref 8–23)
CO2: 23 mmol/L (ref 22–32)
Calcium: 8.8 mg/dL — ABNORMAL LOW (ref 8.9–10.3)
Chloride: 107 mmol/L (ref 98–111)
Creatinine, Ser: 0.7 mg/dL (ref 0.44–1.00)
GFR, Estimated: >60 mL/min (ref >60)
Glucose, Bld: 102 mg/dL — ABNORMAL HIGH (ref 70–99)
Potassium: 4 mmol/L (ref 3.5–5.1)
Sodium: 136 mmol/L (ref 135–145)

## 2021-07-08 LAB — CBC WITH DIFFERENTIAL/PLATELET
Abs Immature Granulocytes: 0.02 10*3/uL (ref 0.00–0.07)
Basophils Absolute: 0.1 10*3/uL (ref 0.0–0.1)
Basophils Relative: 1 %
Eosinophils Absolute: 0.1 10*3/uL (ref 0.0–0.5)
Eosinophils Relative: 2 %
HCT: 38.9 % (ref 36.0–46.0)
Hemoglobin: 13 g/dL (ref 12.0–15.0)
Immature Granulocytes: 0 %
Lymphocytes Relative: 33 %
Lymphs Abs: 2.5 10*3/uL (ref 0.7–4.0)
MCH: 31 pg (ref 26.0–34.0)
MCHC: 33.4 g/dL (ref 30.0–36.0)
MCV: 92.6 fL (ref 80.0–100.0)
Monocytes Absolute: 0.7 10*3/uL (ref 0.1–1.0)
Monocytes Relative: 9 %
Neutro Abs: 4.3 10*3/uL (ref 1.7–7.7)
Neutrophils Relative %: 55 %
Platelets: 234 10*3/uL (ref 150–400)
RBC: 4.2 MIL/uL (ref 3.87–5.11)
RDW: 13.7 % (ref 11.5–15.5)
WBC: 7.7 10*3/uL (ref 4.0–10.5)
nRBC: 0 % (ref 0.0–0.2)

## 2021-07-08 LAB — SEDIMENTATION RATE: Sed Rate: 2 mm/hr (ref 0–22)

## 2021-07-08 MED ORDER — DIPHENHYDRAMINE HCL 50 MG/ML IJ SOLN
12.5000 mg | Freq: Once | INTRAMUSCULAR | Status: AC
  Administered 2021-07-08: 12.5 mg via INTRAVENOUS
  Filled 2021-07-08: qty 1

## 2021-07-08 MED ORDER — PROCHLORPERAZINE EDISYLATE 10 MG/2ML IJ SOLN
10.0000 mg | Freq: Once | INTRAMUSCULAR | Status: AC
  Administered 2021-07-08: 10 mg via INTRAVENOUS
  Filled 2021-07-08: qty 2

## 2021-07-08 NOTE — Discharge Instructions (Addendum)
You were seen and evaluated in the emergency room today for evaluation of right-sided headache.  As we discussed, I have a low suspicion for any emergent causes of your headache at this time.  I would like for you to follow-up with your primary care doctor within the next week to ensure we are going the right direction.  Please return to the emergency department if you experience worsening headache, loss of vision, double vision, blurred vision, weakness/numbness of any extremity, or any other concerns you might have.

## 2021-07-08 NOTE — ED Provider Notes (Signed)
[name]   CSN: [id number] Arrival date & time: 07/08/21  1501     History Chief Complaint  Patient presents with   Headache    [name] is a 65 y.o. female who presents to the emergency department with a 4-week history of intermittent right frontal and temporal headache.  She describes her headache as a sharp stabbing sensation.  She currently rates her headache a 9/10 in severity.  It is not improved with over-the-counter analgesics.  Nothing seems to make it worse or bring on the headaches.  She reports associated right eye pain when the headache is at its peak but denies any visual disturbances including double vision, loss of vision, blurred vision.  She also denies focal weakness/numbness in the upper or lower extremities.  Also complains of dizziness characterizes a lightheaded sensation and nausea.  Denies any chest pain, fever, chills, or shortness of breath. Denies any jaw claudication.   The history is provided by the patient. No language interpreter was used.  Headache     Past Medical History:  Diagnosis Date   Bipolar disorder ([place])    PT ON TOPIRAMATE AND ZANAX- SEES PSYCHIATRIST DR. COTTLE - PT STATES DOING WELL ON MEDICATIONS   COPD (chronic obstructive pulmonary disease) ([place])    OCCAS USE OF INHALER-QUIT SMOKING 2014 - USING E- [employer]IG[employer]   Diverticulitis    GERD (gastroesophageal reflux disease)    H/O suicide attempt 2007   PT'S SON HAD COMMITTED SUICIDE AND SHE WAS OVER COME WITH GRIEF   Hypothyroidism    IBS (irritable bowel syndrome)    Lichen simplex chronicus    MVP (mitral valve prolapse)    DOES NOT CAUSE ANY PROBLEMS   Osteoporosis    Pain    RIGHT SHOULDER AND LIMITED ROM - -RT SHOULDER IMPINGEMENT, ADHESIVE CAPSULITIS, ROTATOR CUFF TEAR   Pain    NECK PAIN -PT HOPING SURGERY ON HER SHOULDER HELPS HER NECK PAIN- DOES HAVE HX OF CERBVICAL DISK FUSION.   Pain    LUMBAR PAIN - DDD AND PREVIOUS  LUMBAR SURGERY   Vitamin D deficiency     Patient Active Problem List   Diagnosis Date Noted   Encounter for screening for lung cancer 03/24/2020   PTSD (post-traumatic stress disorder) 06/27/2018   Bipolar II disorder ([place]) 06/11/2018    Past Surgical History:  Procedure Laterality Date   ABDOMINAL HYSTERECTOMY  1975   BACK SURGERY     LUMBAR SURGERY FOR HNP   BREAST SURGERY     BREAST REDUCTION   CERVICAL DISK FUSION     LEFT OOPHORECTOMY  1994   REDUCTION MAMMAPLASTY     SHOULDER ARTHROSCOPY WITH SUBACROMIAL DECOMPRESSION AND OPEN ROTATOR C Right 02/19/2014   Procedure: RIGHT SHOULDER ARTHROSCOPY SUBACROMIAL DECOMPRESSION EVALUATION UNDER ANESTHESIA AND  MANIPULATION UNDER ANESTHIESIA DEBRIDEMENT OF PARTIAL ROTATOR CUFF;  Surgeon: Johnn Hai, MD;  Location: [facility];  Service: Orthopedics;  Laterality: Right;     OB History   No obstetric history on file.     Family History  Problem Relation Age of Onset   Heart failure Mother    Heart disease Mother    Stroke Mother    Heart disease Father    Heart attack Paternal Grandmother     Social History   Tobacco Use   Smoking status: Every Day    Packs/day: 0.75    Years: 55.00    Pack years: 41.25    Types: Cigarettes  Smokeless tobacco: Current  Substance Use Topics   Alcohol use: No   Drug use: No    Home Medications Prior to Admission medications   Medication Sig Start Date End Date Taking? Authorizing Provider  Acetylcysteine (N-ACETYL-L-CYSTEINE) 600 MG CAPS Take 600 mg by mouth daily.    [provider]  albuterol (PROVENTIL HFA;VENTOLIN HFA) 108 (90 BASE) MCG/ACT inhaler Inhale 1 puff into the lungs every 6 (six) hours as needed for wheezing or shortness of breath.    [provider]  ALPRAZolam (XANAX) 0.5 MG tablet TAKE 1 TABLET (0.5 MG TOTAL) BY MOUTH IN THE MORNING, AT NOON, IN THE EVENING, AND AT BEDTIME. 03/14/21   Cottle, Billey Co., MD  aspirin EC 81 MG tablet Take 1 tablet  (81 mg total) by mouth daily. Swallow whole. 04/20/20   Elouise Munroe, MD  atorvastatin (LIPITOR) 80 MG tablet Take 1 tablet (80 mg total) by mouth daily. 04/20/20 07/19/20  Elouise Munroe, MD  citalopram (CELEXA) 20 MG tablet Take 1.5 tablets (30 mg total) by mouth daily. 05/04/21   Cottle, Billey Co., MD  denosumab (PROLIA) 60 MG/ML SOLN injection Inject 60 mg into the skin every 6 (six) months. Administer in upper arm, thigh, or abdomen    [provider]  dexlansoprazole (DEXILANT) 60 MG capsule Take 60 mg by mouth daily.    [provider]  diphenhydrAMINE (BENADRYL) 50 MG tablet Take one 50 mg tablet one hour before the test with your last dose of the prednisone. 10/08/19   Elouise Munroe, MD  diphenhydrAMINE (BENADRYL) 50 MG tablet Take 1 tablet (50 mg total) by mouth once for 1 dose. Pt to take 50 mg of benadryl 1 hour before her CT scan (12/17/20 @ 8:40 AM), with last dose of prednisone. 12/07/20 12/07/20  Titus Dubin, MD  Lurasidone HCl 120 MG TABS Take 1 tablet (120 mg total) by mouth daily. 05/04/21   Cottle, Billey Co., MD  omeprazole (PRILOSEC) 40 MG capsule Take 40 mg by mouth daily.    [provider]  predniSONE (DELTASONE) 50 MG tablet Take one 50 mg tablet 13 hours, 7 hours and 1 hour before the test. 10/08/19   Elouise Munroe, MD  SYNTHROID 88 MCG tablet Take 88 mcg by mouth daily. 06/04/20   [provider]  topiramate (TOPAMAX) 100 MG tablet TAKE 1 & 1/2 TABLETS BY MOUTH DAILY 07/05/21   Cottle, Billey Co., MD  TRELEGY ELLIPTA 100-62.5-25 MCG/INH AEPB Inhale 1 puff into the lungs daily. 02/06/20   [provider]  Vitamin D, Ergocalciferol, (DRISDOL) 50000 UNITS CAPS capsule Take 50,000 Units by mouth every 7 (seven) days.    [provider]    Allergies    Alendronate sodium, Fiorinal [butalbital-aspirin-caffeine], Iohexol, Klonopin [clonazepam], Other, Tramadol, and Dexilant [dexlansoprazole]  Review of Systems    Review of Systems  Neurological:  Positive for headaches.  All other systems reviewed and are negative.  Physical Exam Updated Vital Signs BP 126/74   Pulse (!) 56   Temp 98 F (36.7 C) (Tympanic)   Resp 20   Ht _0  (1.702 m)   Wt 52.2 kg   SpO2 100%   BMI 18.01 kg/m   Physical Exam Vitals and nursing [name] reviewed.  Constitutional:      General: She is not in acute distress.    Appearance: Normal appearance.  HENT:     Head: Normocephalic.     Comments: There  is tenderness over the right temple. Eyes:     General:        Right eye: No discharge.        Left eye: No discharge.     Extraocular Movements: Extraocular movements intact.     Right eye: No nystagmus.     Left eye: No nystagmus.     Pupils: Pupils are equal, round, and reactive to light.  Cardiovascular:     Comments: Regular rate and rhythm.  S1/S2 are distinct without any evidence of murmur, rubs, or gallops.  Radial pulses are 2+ bilaterally.  Dorsalis pedis pulses are 2+ bilaterally.  No evidence of pedal edema. Pulmonary:     Comments: Clear to auscultation bilaterally.  Normal effort.  No respiratory distress.  No evidence of wheezes, rales, or rhonchi heard throughout. Abdominal:     General: Abdomen is flat. Bowel sounds are normal. There is no distension.     Tenderness: There is no abdominal tenderness. There is no guarding or rebound.  Musculoskeletal:        General: Normal range of motion.     Cervical back: Neck supple.  Skin:    General: Skin is warm and dry.     Findings: No rash.  Neurological:     General: No focal deficit present.     Mental Status: She is alert.     Comments: Cranial nerves II through XII are intact.  5/5 strength in the upper and lower extremities.  Normal sensation in the upper and lower extremities.  Psychiatric:        Mood and Affect: Mood normal.        Behavior: Behavior normal.    ED Results / Procedures / Treatments   Labs (all labs ordered are  listed, but only abnormal results are displayed) Labs Reviewed  BASIC METABOLIC PANEL - Abnormal; Notable for the following components:      Result Value   Glucose, Bld 102 (*)    Calcium 8.8 (*)    All other components within normal limits  CBC WITH DIFFERENTIAL/PLATELET  SEDIMENTATION RATE    EKG None  Radiology CT Head Wo Contrast  Result Date: 07/08/2021 CLINICAL DATA:  Headache, new or worsening. Headache for 4 weeks in the upper right frontal region. EXAM: CT HEAD WITHOUT CONTRAST TECHNIQUE: Contiguous axial images were obtained from the base of the skull through the vertex without intravenous contrast. COMPARISON:  CT head 12/15/2005.  MRI 09/14/2014. FINDINGS: Brain: There is no evidence of acute intracranial hemorrhage, mass lesion, brain edema or extra-axial fluid collection. Mild atrophy with mild prominence of the ventricles and subarachnoid spaces. There is no CT evidence of acute cortical infarction. Vascular: Intracranial vascular calcifications. No hyperdense vessel identified. Skull: Negative for fracture or focal lesion. Sinuses/Orbits: The visualized paranasal sinuses and mastoid air cells are clear. No orbital abnormalities are seen. Other: None. IMPRESSION: No acute or significant intracranial findings. Mild cerebral atrophy. Electronically Signed   By: Richardean Sale M.D.   On: 07/08/2021 18:25    Procedures Procedures   Medications Ordered in ED Medications  prochlorperazine (COMPAZINE) injection 10 mg (10 mg Intravenous Given 07/08/21 1706)  diphenhydrAMINE (BENADRYL) injection 12.5 mg (12.5 mg Intravenous Given 07/08/21 1709)    ED Course  I have reviewed the triage vital signs and the nursing notes.  Pertinent labs & imaging results that were available during my care of the patient were reviewed by me and considered in my medical decision making (see chart for  details).    MDM Rules/Calculators/A&P                          ENYLA LISBON is a 65 y.o.  female who presents to the emergency department for evaluation of a right-sided headache and right eye pain.  Neurologic exam was without any evidence of meningismus, altered mental status, or focal neurological findings.  I have a low suspicion for meningitis, encephalitis, or and stroke at this time.  Her presentation is not consistent with acute intracranial bleed or subarachnoid hemorrhage at this time.  She has no history of trauma.  Presentation does not suggest tumor or abscess at this time.  CBC and BMP are without any significant abnormalities.  ESR was normal.  I initially ordered a CRP which has to be courier to over to [facility].  Somehow it did not make it over during the day rounds and will be another 2+ hours to get that result back.  Concerned that the ESR is negative have a low suspicion for giant cell arteritis at this time.  She has had no visual changes, jaw claudication, or pulsatile temporal artery. CT was negative for any acute intracranial pathology at this time.  Given the clinical scenario, I believe she is safe for discharge.  Strict return precautions were given.  I am going to have her follow-up with her primary care doctor within the next week to ensure we are going the right direction for further evaluation.  All questions and concerns addressed.  She is feeling much better after her headache cocktail and feels comfortable going home.    Final Clinical Impression(s) / ED Diagnoses Final diagnoses:  Acute nonintractable headache, unspecified headache type    Rx / DC Orders ED Discharge Orders     None        Cherrie Gauze 07/08/21 2037    Davonna Belling, MD 07/08/21 2312

## 2021-07-08 NOTE — ED Notes (Signed)
Shift change report given to Jeronimo Greaves, Therapist, sports.

## 2021-07-08 NOTE — ED Notes (Signed)
Pt c/o headache x4 weeks. Pt denies any trauma event at that time and does not remember anything happening before her headache started. Pt states it hurts mainly over her right eye and her right eye has been watering more than usual.

## 2021-07-08 NOTE — ED Triage Notes (Signed)
Pt reports headache for 4 weeks, upper right frontal.  Telemedicine gave her Rx without relief.  [facility] and advised to followup in ED.

## 2021-07-13 ENCOUNTER — Telehealth: Payer: Self-pay | Admitting: Psychiatry

## 2021-07-13 NOTE — Telephone Encounter (Signed)
Please see [name]'s note below and advise. Thank you.    [name] called to report a severe headache over her right eye. She has had this ongoing for about 3 weeks. She also had congestion and got a sinus infection. Got put on antibiotics and took them. Heachache did not go away, but sinus infection got better. Went to [employer] in clinic last week and they suggested ER visit to get head scan. She did that on 10/21, Friday, and scan was clear. She followed up with Dr. Dema Severin at [facility] and also went to dentist to get checked out. Dr. Dema Severin gave her prednisone and is going to see her Friday 10/28. [name] wants to know if there is anything you can think of that could be causing the HA. Excedrin and Sudafed don't help it. No med changes, except on Prednisone for last two days.

## 2021-07-13 NOTE — Telephone Encounter (Signed)
[name] called to report a severe headache over her right eye. She has had this ongoing for about 3 weeks. She also had congestion and got a sinus infection. Got put on antibiotics and took them. Heachache did not go away, but sinus infection got better. Went to [employer] in clinic last week and they suggested ER visit to get head scan. She did that on 10/21, Friday, and scan was clear. She followed up with Dr. Dema Severin at [facility] and also went to dentist to get checked out. Dr. Dema Severin gave her prednisone and is going to see her Friday 10/28. [name] wants to know if there is anything you can think of that could be causing the HA. Excedrin and Sudafed don't help it. No med changes, except on Prednisone for last two days.

## 2021-07-15 DIAGNOSIS — R519 Headache, unspecified: Secondary | ICD-10-CM | POA: Diagnosis not present

## 2021-07-17 DIAGNOSIS — E785 Hyperlipidemia, unspecified: Secondary | ICD-10-CM | POA: Diagnosis not present

## 2021-07-17 DIAGNOSIS — E039 Hypothyroidism, unspecified: Secondary | ICD-10-CM | POA: Diagnosis not present

## 2021-07-17 DIAGNOSIS — R69 Illness, unspecified: Secondary | ICD-10-CM | POA: Diagnosis not present

## 2021-07-17 DIAGNOSIS — M810 Age-related osteoporosis without current pathological fracture: Secondary | ICD-10-CM | POA: Diagnosis not present

## 2021-07-17 DIAGNOSIS — K219 Gastro-esophageal reflux disease without esophagitis: Secondary | ICD-10-CM | POA: Diagnosis not present

## 2021-07-17 DIAGNOSIS — J449 Chronic obstructive pulmonary disease, unspecified: Secondary | ICD-10-CM | POA: Diagnosis not present

## 2021-07-18 ENCOUNTER — Other Ambulatory Visit: Payer: Self-pay | Admitting: Family Medicine

## 2021-07-18 DIAGNOSIS — R519 Headache, unspecified: Secondary | ICD-10-CM

## 2021-07-19 NOTE — Telephone Encounter (Signed)
I neglected to respond to this earlier.  We have not made any psychiatric medicine changes in recent months.  She can get headaches from withdrawal if she stops any of the medicines abruptly.  I assume she has not done that.  So I do not think there is anything I can offer her on the subject of her headaches.  She does needs to follow up with her primary care doctor about it.

## 2021-07-19 NOTE — Telephone Encounter (Signed)
Called patient with information.  She saw PCP and was given zolmitriptan 5 mg with another dose 2 hours later if needed. She has had a CT scan and will possibly have an MRI scan. She is being seen at [facility] tomorrow. She states the medication doesn't relieve the pain.

## 2021-07-20 ENCOUNTER — Telehealth: Payer: Self-pay | Admitting: Psychiatry

## 2021-07-20 ENCOUNTER — Ambulatory Visit: Payer: Medicare HMO | Admitting: Psychiatry

## 2021-07-20 ENCOUNTER — Encounter: Payer: Self-pay | Admitting: Psychiatry

## 2021-07-20 VITALS — BP 104/62 | HR 80 | Ht 67.0 in | Wt 112.1 lb

## 2021-07-20 DIAGNOSIS — R519 Headache, unspecified: Secondary | ICD-10-CM

## 2021-07-20 DIAGNOSIS — Z8249 Family history of ischemic heart disease and other diseases of the circulatory system: Secondary | ICD-10-CM | POA: Diagnosis not present

## 2021-07-20 MED ORDER — INDOMETHACIN 25 MG PO CAPS: ORAL_CAPSULE | ORAL | 0 refills | Status: AC

## 2021-07-20 NOTE — Patient Instructions (Addendum)
CTA of the head Indomethacin trial to assess for hemicrania continua: Week  1:  -Please obtain generic omeprazole (20 mg) (Prilosec) [[employer], [employer], [employer], etc] and start one daily. You may increase to twice a day if necessary. -Please start indomethacin (indocin) 25 mg (one capsule) 3 times per day with meals. -If, in one week, you are headache-free, this is your dose, stay on it. -If, in one week, you are tolerating the indocin, but have headache, then increase the dose as below. Week 2: - Please increase indocin to 50 mg (2 capsules) 3 times per day with meals. - If, in one week, you are headache-free, this is your dose, stay on it. -If, in one week, you are tolerating the indocin, but have headache, then increase the dose as below. Week 3:  -Please increase indocin to 75 mg (3 capsules) 3 times per day with meals. Let me know how you are doing once you finish the course of Indocin  3. Consider occipital nerve block (procedure where we inject numbing medication and steroid in the back of the head). You can call the office to schedule this

## 2021-07-20 NOTE — Telephone Encounter (Signed)
[employer] medicare order sent to [facility], they will obtain the auth and reach out to the patient to schedule.  

## 2021-07-20 NOTE — Progress Notes (Signed)
Referring:  Lynn Stains, MD [address] [place],  [place] [address]  PCP: Lynn Stains, MD  Neurology was asked to evaluate [name], a 65 year old female for a chief complaint of headaches.  Our recommendations of care will be communicated by shared medical record.    CC:  headaches  HPI:  Medical co-morbidities: bipolar, HLD, hypothyroidism  The patient presents for evaluation of right sided headaches which began 6 weeks ago on September 28. She developed an upper respiratory infection soon after this. Respiratory symptoms improved but headaches persisted. Headache comes and goes throughout the day. Headache is described as right sided stabbing pain with ipsilateral eye lacrimation, ptosis, and rhinorrhea. It occurs multiple times per day and lasts for seconds at a time. She denies interictal pain but does say she constantly "feels bad". Denies jaw pain or vision changes. Was prescribed Zomig which hasn't made a difference.  [place]ye on 07/08/21 showed mild cerebral atrophy and was otherwise unremarkable. She has an MRI scheduled for 08/01/21.  Headache History: Onset: 6 weeks ago Triggers: no triggers Aura: no Location: right temple, frontal area Quality/Description: sharp Severity: 9/10 Associated Symptoms:  Photophobia: no  Phonophobia: yes  Nausea: yes Vomiting: yes Other symptoms: droopy eyelid, eye redness, runny nose on the right Duration of headaches: seconds Red flags:   New onset age>50  Headache days per month: 30 Headache free days per month: 0  Current Treatment: Abortive Zomig 5 mg PRN  Preventative Topamax 150 mg daily   Prior Therapies                                 Zomig 5 mg PRN Topamax 150 mg daily Celexa 30 mg daily   Headache Risk Factors: Headache risk factors and/or co-morbidities (+) Neck Pain - stiffness (-) Obesity  Body mass index is 17.56 kg/m. (-) History of Traumatic Brain Injury and/or  Concussion  LABS: 07/08/21: ESR 2  IMAGING:  [facility] 07/08/21: mild cerebral atrophy, otherwise unremarkable  Imaging independently reviewed on July 20, 2021   Current Outpatient Medications on File Prior to Visit  Medication Sig Dispense Refill   Acetylcysteine (N-ACETYL-L-CYSTEINE) 600 MG CAPS Take 600 mg by mouth daily.     albuterol (PROVENTIL HFA;VENTOLIN HFA) 108 (90 BASE) MCG/ACT inhaler Inhale 1 puff into the lungs every 6 (six) hours as needed for wheezing or shortness of breath.     ALPRAZolam (XANAX) 0.5 MG tablet TAKE 1 TABLET (0.5 MG TOTAL) BY MOUTH IN THE MORNING, AT NOON, IN THE EVENING, AND AT BEDTIME. 120 tablet 5   aspirin EC 81 MG tablet Take 1 tablet (81 mg total) by mouth daily. Swallow whole. 90 tablet 3   citalopram (CELEXA) 20 MG tablet Take 1.5 tablets (30 mg total) by mouth daily. 135 tablet 1   denosumab (PROLIA) 60 MG/ML SOLN injection Inject 60 mg into the skin every 6 (six) months. Administer in upper arm, thigh, or abdomen     dexlansoprazole (DEXILANT) 60 MG capsule Take 60 mg by mouth daily.     diphenhydrAMINE (BENADRYL) 50 MG tablet Take one 50 mg tablet one hour before the test with your last dose of the prednisone. 1 tablet 0   Lurasidone HCl 120 MG TABS Take 1 tablet (120 mg total) by mouth daily. 30 tablet 5   omeprazole (PRILOSEC) 40 MG capsule Take 40 mg by mouth daily.     predniSONE (  DELTASONE) 50 MG tablet Take one 50 mg tablet 13 hours, 7 hours and 1 hour before the test. 3 tablet 0   SYNTHROID 88 MCG tablet Take 88 mcg by mouth daily.     topiramate (TOPAMAX) 100 MG tablet TAKE 1 & 1/2 TABLETS BY MOUTH DAILY 135 tablet 0   TRELEGY ELLIPTA 100-62.5-25 MCG/INH AEPB Inhale 1 puff into the lungs daily.     Vitamin D, Ergocalciferol, (DRISDOL) 50000 UNITS CAPS capsule Take 50,000 Units by mouth every 7 (seven) days.     zolmitriptan (ZOMIG) 5 MG tablet Take 5 mg by mouth as needed for migraine.     atorvastatin (LIPITOR) 80 MG tablet Take 1 tablet  (80 mg total) by mouth daily. 90 tablet 3   diphenhydrAMINE (BENADRYL) 50 MG tablet Take 1 tablet (50 mg total) by mouth once for 1 dose. Pt to take 50 mg of benadryl 1 hour before her CT scan (12/17/20 @ 8:40 AM), with last dose of prednisone. 1 tablet 0   No current facility-administered medications on file prior to visit.     Allergies: Allergies  Allergen Reactions   Alendronate Sodium Shortness Of Breath   Fiorinal [Butalbital-Aspirin-Caffeine]     SWELLING OF LIPS AND HIVES   Iohexol      Code: HIVES, Desc: omnipaque 300-doc'd by KB on 04-13-06.Marland KitchenMarland Kitchenpt states she gets severe hives    Klonopin [Clonazepam]     PT STATES SHE OVERDOSED ON KLONOPIN   Other     THE PURPLE DYE IN GENERIC SYNTHROID ( LEVOTHYROXINE ) CAUSED BREAKING OUT FACE AND SCALP.  PT CAN TAKE SYNTHROID.   Tramadol Other (See Comments)    depression   Dexilant [Dexlansoprazole] Diarrhea    Family History: Migraine or other headaches in the family:  no Aneurysms in a first degree relative:  yes, father, cousin Brain tumors in the family:  no Other neurological illness in the family:   no  Past Medical History: Past Medical History:  Diagnosis Date   Bipolar disorder ([place])    PT ON TOPIRAMATE AND ZANAX- SEES PSYCHIATRIST DR. COTTLE - PT STATES DOING WELL ON MEDICATIONS   COPD (chronic obstructive pulmonary disease) ([place])    OCCAS USE OF INHALER-QUIT SMOKING 2014 - USING E- [employer]IG[employer]   Diverticulitis    GERD (gastroesophageal reflux disease)    H/O suicide attempt 2007   PT'S SON HAD COMMITTED SUICIDE AND SHE WAS OVER COME WITH GRIEF   Hypothyroidism    IBS (irritable bowel syndrome)    Lichen simplex chronicus    MVP (mitral valve prolapse)    DOES NOT CAUSE ANY PROBLEMS   Osteoporosis    Pain    RIGHT SHOULDER AND LIMITED ROM - -RT SHOULDER IMPINGEMENT, ADHESIVE CAPSULITIS, ROTATOR CUFF TEAR   Pain    NECK PAIN -PT HOPING SURGERY ON HER SHOULDER HELPS HER NECK PAIN- DOES HAVE HX OF CERBVICAL DISK  FUSION.   Pain    LUMBAR PAIN - DDD AND PREVIOUS LUMBAR SURGERY   Vitamin D deficiency     Past Surgical History Past Surgical History:  Procedure Laterality Date   ABDOMINAL HYSTERECTOMY  1975   BACK SURGERY     LUMBAR SURGERY FOR [facility]   BREAST SURGERY     BREAST REDUCTION   CERVICAL DISK FUSION     LEFT OOPHORECTOMY  1994   REDUCTION MAMMAPLASTY     SHOULDER ARTHROSCOPY WITH SUBACROMIAL DECOMPRESSION AND OPEN ROTATOR C Right 02/19/2014   Procedure: RIGHT SHOULDER ARTHROSCOPY SUBACROMIAL DECOMPRESSION  EVALUATION UNDER ANESTHESIA AND  MANIPULATION UNDER ANESTHIESIA DEBRIDEMENT OF PARTIAL ROTATOR CUFF;  Surgeon: Johnn Hai, MD;  Location: [facility];  Service: Orthopedics;  Laterality: Right;    Social History: Social History   Tobacco Use   Smoking status: Every Day    Packs/day: 0.75    Years: 55.00    Pack years: 41.25    Types: Cigarettes   Smokeless tobacco: Current  Substance Use Topics   Alcohol use: No   Drug use: No    ROS: Negative for fevers, chills. Positive for headaches, ptosis, rhinorrhea. All other systems reviewed and negative unless stated otherwise in HPI.   Physical Exam:   Vital Signs: BP 104/62   Pulse 80   Ht 5' 7"  (1.702 m)   Wt 112 lb 2 oz (50.9 kg)   SpO2 98%   BMI 17.56 kg/m  GENERAL: grimacing in pain SKIN:  Color, texture, turgor normal. No rashes or lesions HEAD:  Normocephalic/atraumatic. CV:  RRR RESP: Normal respiratory effort MSK: +tenderness to palpation over right temple and occiput  NEUROLOGICAL: Mental Status: Alert, oriented to person, place and time,Follows commands Cranial Nerves: PERRL,visual fields intact to confrontation,extraocular movements intact,facial sensation intact,no facial droop or ptosis,hearing intact to finger rub bilaterally,no dysarthria,palate elevate symmetrically,tongue protrudes midline,shoulder shrug intact and symmetric Motor: muscle strength 5/5 both upper and lower extremities,no drift, normal  tone Reflexes: 2+ throughout Sensation: intact to light touch all 4 extremities Coordination: Finger-to- nose-finger intact bilaterally,Heel-to-shin intact bilaterally Gait: normal-based   IMPRESSION: 65 year old female with a history of bipolar disorder, HLD, hypothyroidism who presents for evaluation of new right sided headaches which began 6 weeks ago. They are associated with ipsilateral autonomic features and last seconds at a time. 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 Will first do an indomethacin trial to rule out hemicrania continua. If she does not respond to indomethacin will plan to treat for SUNCT. MRI brain ordered for later this month. Will add CTA given family history of first degree relative with aneurysm.  PLAN: -MRI brain scheduled for 11/14 -CTA head -Indomethacin trial to rule out hemicrania continua: 25 mg TID x 7 days, then 50 mg TID x 7 days, then 75 mg TID x 7 days -If no response to Indomethacin will plan to treat for SUNCT. Ideally would treat with Lamictal, however this does have interactions with her Topamax. Could consider increasing Topamax or starting gabapentin as an alternative -Consider occipital nerve block   I spent a total of 40 minutes chart reviewing and counseling the patient. Headache education was done. Discussed treatment options including preventive and acute medications, natural supplements, and physical therapy. Discussed medication overuse headache and to limit use of acute treatments to no more than 2 days/week or 10 days/month. Discussed medication side effects, adverse reactions and drug interactions. Written educational materials and patient instructions outlining all of the above were given.  Follow-up: 3 months, or sooner for nerve block   Genia Harold, MD 07/20/2021   10:41 AM

## 2021-07-21 ENCOUNTER — Telehealth: Payer: Self-pay | Admitting: Psychiatry

## 2021-07-21 NOTE — Telephone Encounter (Signed)
Sent my chart message to patient on this.

## 2021-07-21 NOTE — Telephone Encounter (Signed)
Pt wants to discuss scheduling the suggested nerve block mentioned by Dr Billey Gosling

## 2021-07-25 NOTE — Telephone Encounter (Signed)
Pt was called and has been scheduled for her nerve block. This is FYI no call back requested

## 2021-07-27 NOTE — Telephone Encounter (Signed)
I called pt's husband back and advised Dr. Billey Gosling had ordered the CTA of the head which is different from the CT head she had in the [facility]. Also advised the nerve block is scheduled for 08/02/2021 and we will keep as scheduled of this . He verbalized understanding and will relay to the patient and have her CB if she has any questions.

## 2021-07-27 NOTE — Telephone Encounter (Signed)
Patient left a voicemail on my phone stating she thought she was getting a nerve block done on Thursday and not a scan because she stated she had a scan in the ER and they didn't find anything.

## 2021-07-28 ENCOUNTER — Telehealth: Payer: Self-pay

## 2021-07-28 ENCOUNTER — Inpatient Hospital Stay: Admission: RE | Admit: 2021-07-28 | Payer: Medicare HMO | Source: Ambulatory Visit

## 2021-07-28 MED ORDER — PREDNISONE 50 MG PO TABS: ORAL_TABLET | ORAL | 0 refills | Status: DC

## 2021-07-28 MED ORDER — DIPHENHYDRAMINE HCL 50 MG PO TABS: 50.0000 mg | ORAL_TABLET | Freq: Once | ORAL | 0 refills | Status: DC

## 2021-07-28 NOTE — Telephone Encounter (Signed)
Phone call to patient to review instructions for 13 hr prep for CT w/ contrast on 08/03/21 at 11:40 AM. Prescription called into [facility]. Pt aware and verbalized understanding of instructions.  Prescription: Pt to take 50 mg of prednisone on 08/02/21 at 10:40 pm, 50 mg of prednisone on 08/03/21 at 4:40 am, and 50 mg of prednisone on 08/03/21 at 10:40 am. Pt is also to take 50 mg of benadryl on 08/03/21 at 10:40 am. Please call [phone] with any questions.    Benadryl and prednisone were called in as prescriptions. Pt aware not to drive the day of taking benadryl as this may cause drowsiness.

## 2021-08-01 ENCOUNTER — Ambulatory Visit
Admission: RE | Admit: 2021-08-01 | Discharge: 2021-08-01 | Disposition: A | Discharge status: Home / Self Care | Payer: Medicare HMO | Source: Ambulatory Visit | Attending: Family Medicine | Admitting: Family Medicine

## 2021-08-01 ENCOUNTER — Other Ambulatory Visit: Payer: Self-pay

## 2021-08-01 DIAGNOSIS — I6782 Cerebral ischemia: Secondary | ICD-10-CM | POA: Diagnosis not present

## 2021-08-01 DIAGNOSIS — R519 Headache, unspecified: Secondary | ICD-10-CM

## 2021-08-01 DIAGNOSIS — H538 Other visual disturbances: Secondary | ICD-10-CM | POA: Diagnosis not present

## 2021-08-01 MED ORDER — GADOBENATE DIMEGLUMINE 529 MG/ML IV SOLN
10.0000 mL | Freq: Once | INTRAVENOUS | Status: AC | PRN
  Administered 2021-08-01: 10 mL via INTRAVENOUS

## 2021-08-02 ENCOUNTER — Encounter: Payer: Self-pay | Admitting: Psychiatry

## 2021-08-02 ENCOUNTER — Ambulatory Visit: Payer: Medicare HMO | Admitting: Psychiatry

## 2021-08-02 VITALS — BP 119/70 | HR 62

## 2021-08-02 DIAGNOSIS — G44059 Short lasting unilateral neuralgiform headache with conjunctival injection and tearing (SUNCT), not intractable: Secondary | ICD-10-CM

## 2021-08-02 NOTE — Progress Notes (Signed)
Procedure: Occipital Nerve injection   Location: bilateral occiput  The risks, benefits and anticipated outcomes of the procedure, the risks and benefits of the alternatives to the procedure, and the roles and tasks of the personnel to be involved, were discussed with the patient, and the patient consents to the procedure and agrees to proceed.     A combination of 1 cc betamethasone 6 mg and 3 cc of 0.5% bupavicaine were prepared in 2 syringes (5 cc).  The bilateral greater occipital nerve(s) was injected 3cm caudal and 1.5 cm lateral to the inion where the main trunk of the occipital nerve penetrates the semispinalis muscle.  The needle was placed perpendicular and the needle advanced 1.5 cm. After aspiration to ensure no obstruction or presence of blood, the area was injected.  The needle was repositioned in a fan-like manner and the entire area was injected. Pressure was held and no hematoma was noted. The patient noted relief of pain after 5 minutes.  The patient was told to use heat if there was discomfort later in the day.  Pre procedure Pain: 8/10 Post procedure pain: 0/10  Genia Harold, MD 08/02/21 1:59 PM  Bupivacaine lot #[id number] Exp 09/2022  Betamethasone lot #[id number] Exp 04/2022

## 2021-08-03 ENCOUNTER — Other Ambulatory Visit: Payer: Self-pay

## 2021-08-03 ENCOUNTER — Ambulatory Visit
Admission: RE | Admit: 2021-08-03 | Discharge: 2021-08-03 | Disposition: A | Discharge status: Home / Self Care | Payer: Medicare HMO | Source: Ambulatory Visit | Attending: Psychiatry | Admitting: Psychiatry

## 2021-08-03 DIAGNOSIS — R519 Headache, unspecified: Secondary | ICD-10-CM | POA: Diagnosis not present

## 2021-08-03 DIAGNOSIS — Z8249 Family history of ischemic heart disease and other diseases of the circulatory system: Secondary | ICD-10-CM

## 2021-08-03 MED ORDER — IOPAMIDOL (ISOVUE-370) INJECTION 76%
75.0000 mL | Freq: Once | INTRAVENOUS | Status: AC | PRN
  Administered 2021-08-03: 75 mL via INTRAVENOUS

## 2021-08-03 MED ORDER — IOPAMIDOL (ISOVUE-300) INJECTION 61%
100.0000 mL | Freq: Once | INTRAVENOUS | Status: DC | PRN
Start: 2021-08-03 — End: 2021-08-03

## 2021-08-08 DIAGNOSIS — R519 Headache, unspecified: Secondary | ICD-10-CM | POA: Diagnosis not present

## 2021-08-23 ENCOUNTER — Other Ambulatory Visit: Payer: Self-pay | Admitting: Psychiatry

## 2021-08-23 ENCOUNTER — Other Ambulatory Visit: Payer: Self-pay

## 2021-08-23 ENCOUNTER — Encounter: Payer: Self-pay | Admitting: Psychiatry

## 2021-08-23 ENCOUNTER — Telehealth: Payer: Self-pay | Admitting: Psychiatry

## 2021-08-23 ENCOUNTER — Ambulatory Visit (INDEPENDENT_AMBULATORY_CARE_PROVIDER_SITE_OTHER): Payer: Medicare HMO | Admitting: Psychiatry

## 2021-08-23 DIAGNOSIS — F4001 Agoraphobia with panic disorder: Secondary | ICD-10-CM

## 2021-08-23 DIAGNOSIS — F411 Generalized anxiety disorder: Secondary | ICD-10-CM | POA: Diagnosis not present

## 2021-08-23 DIAGNOSIS — F4310 Post-traumatic stress disorder, unspecified: Secondary | ICD-10-CM | POA: Diagnosis not present

## 2021-08-23 DIAGNOSIS — R69 Illness, unspecified: Secondary | ICD-10-CM | POA: Diagnosis not present

## 2021-08-23 DIAGNOSIS — F3162 Bipolar disorder, current episode mixed, moderate: Secondary | ICD-10-CM | POA: Diagnosis not present

## 2021-08-23 MED ORDER — CLONIDINE HCL 0.1 MG PO TABS: ORAL_TABLET | ORAL | 0 refills | Status: DC

## 2021-08-23 NOTE — Telephone Encounter (Signed)
I will see her as a working on Friday.  She is on the maximum dose of Latuda so I cannot do anything with that.  Suggest she reduce citalopram to 1 tablet instead of 2.  The citalopram will tend to push her into mania and reducing the dose may help a little bit.  Then will make other decisions on Friday.

## 2021-08-23 NOTE — Telephone Encounter (Signed)
Schedule her as a work in on Friday morning

## 2021-08-23 NOTE — Telephone Encounter (Signed)
In the meantime, Thurs Dec 8 had an opening so I've put her in for that.

## 2021-08-23 NOTE — Patient Instructions (Signed)
Clonidine 1 twice daily for 2 days, then 1 in the AM and 2 at night Call Friday with report.

## 2021-08-23 NOTE — Telephone Encounter (Signed)
Pt informed

## 2021-08-23 NOTE — Telephone Encounter (Signed)
Pt feels like she needs to be sooner due to symptoms please let admins know if she can be worked in for [employer]
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
## 2021-08-23 NOTE — Telephone Encounter (Signed)
[name] has called and says that she needs to be seen asap. She feels like she is on the verge of a manic episode. Has a lot of stress in family going on. Very concerned that she can feel it coming on, everything is going fast. She is avail until 1pm and after 4pm today. Can we work her in Friday? [phone]

## 2021-08-25 ENCOUNTER — Ambulatory Visit: Payer: Medicare HMO | Admitting: Psychiatry

## 2021-08-26 ENCOUNTER — Ambulatory Visit: Payer: Medicare HMO | Admitting: Psychiatry

## 2021-08-26 ENCOUNTER — Telehealth: Payer: Self-pay

## 2021-08-26 NOTE — Telephone Encounter (Signed)
Great!  Thanks.  I added clonidine for manic agitation, FYI

## 2021-08-26 NOTE — Telephone Encounter (Signed)
Pt was seen on Tuesday, December 6th and was instructed to call today with an update. She reports feeling so much better, only side effect is dry mouth but she can live with that she says. She wants to thank Dr. Clovis Pu because she reports being in a very bad place when she came in.   Instructed her to call back with worsening symptoms or any questions or concerns.

## 2021-09-06 ENCOUNTER — Other Ambulatory Visit: Payer: Self-pay

## 2021-09-06 ENCOUNTER — Encounter: Payer: Self-pay | Admitting: Psychiatry

## 2021-09-06 ENCOUNTER — Ambulatory Visit: Payer: Medicare HMO | Admitting: Psychiatry

## 2021-09-06 ENCOUNTER — Ambulatory Visit (INDEPENDENT_AMBULATORY_CARE_PROVIDER_SITE_OTHER): Payer: Medicare HMO | Admitting: Psychiatry

## 2021-09-06 DIAGNOSIS — F4001 Agoraphobia with panic disorder: Secondary | ICD-10-CM | POA: Diagnosis not present

## 2021-09-06 DIAGNOSIS — F4310 Post-traumatic stress disorder, unspecified: Secondary | ICD-10-CM | POA: Diagnosis not present

## 2021-09-06 DIAGNOSIS — R69 Illness, unspecified: Secondary | ICD-10-CM | POA: Diagnosis not present

## 2021-09-06 DIAGNOSIS — F3162 Bipolar disorder, current episode mixed, moderate: Secondary | ICD-10-CM

## 2021-09-06 DIAGNOSIS — F411 Generalized anxiety disorder: Secondary | ICD-10-CM

## 2021-09-06 MED ORDER — CLONIDINE HCL 0.1 MG PO TABS: ORAL_TABLET | ORAL | 1 refills | Status: DC
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
## 2021-09-07 DIAGNOSIS — R519 Headache, unspecified: Secondary | ICD-10-CM | POA: Diagnosis not present

## 2021-09-07 DIAGNOSIS — Z049 Encounter for examination and observation for unspecified reason: Secondary | ICD-10-CM | POA: Diagnosis not present

## 2021-09-07 DIAGNOSIS — Z79899 Other long term (current) drug therapy: Secondary | ICD-10-CM | POA: Diagnosis not present

## 2021-09-14 ENCOUNTER — Other Ambulatory Visit: Payer: Self-pay | Admitting: Psychiatry

## 2021-09-14 DIAGNOSIS — F4310 Post-traumatic stress disorder, unspecified: Secondary | ICD-10-CM

## 2021-09-14 DIAGNOSIS — F411 Generalized anxiety disorder: Secondary | ICD-10-CM

## 2021-09-14 DIAGNOSIS — F4001 Agoraphobia with panic disorder: Secondary | ICD-10-CM

## 2021-09-14 DIAGNOSIS — F3162 Bipolar disorder, current episode mixed, moderate: Secondary | ICD-10-CM

## 2021-09-22 DIAGNOSIS — R69 Illness, unspecified: Secondary | ICD-10-CM | POA: Diagnosis not present

## 2021-09-22 DIAGNOSIS — E441 Mild protein-calorie malnutrition: Secondary | ICD-10-CM | POA: Diagnosis not present

## 2021-09-22 DIAGNOSIS — I700 Atherosclerosis of aorta: Secondary | ICD-10-CM | POA: Diagnosis not present

## 2021-09-22 DIAGNOSIS — J449 Chronic obstructive pulmonary disease, unspecified: Secondary | ICD-10-CM | POA: Diagnosis not present

## 2021-09-22 DIAGNOSIS — M792 Neuralgia and neuritis, unspecified: Secondary | ICD-10-CM | POA: Diagnosis not present

## 2021-09-26 DIAGNOSIS — R519 Headache, unspecified: Secondary | ICD-10-CM | POA: Diagnosis not present

## 2021-09-26 DIAGNOSIS — M791 Myalgia, unspecified site: Secondary | ICD-10-CM | POA: Diagnosis not present

## 2021-09-26 DIAGNOSIS — G518 Other disorders of facial nerve: Secondary | ICD-10-CM | POA: Diagnosis not present

## 2021-09-26 DIAGNOSIS — M542 Cervicalgia: Secondary | ICD-10-CM | POA: Diagnosis not present

## 2021-10-03 ENCOUNTER — Other Ambulatory Visit: Payer: Self-pay | Admitting: Psychiatry

## 2021-10-03 DIAGNOSIS — F4001 Agoraphobia with panic disorder: Secondary | ICD-10-CM

## 2021-10-03 DIAGNOSIS — F3181 Bipolar II disorder: Secondary | ICD-10-CM

## 2021-10-04 DIAGNOSIS — K219 Gastro-esophageal reflux disease without esophagitis: Secondary | ICD-10-CM | POA: Diagnosis not present

## 2021-10-04 DIAGNOSIS — R69 Illness, unspecified: Secondary | ICD-10-CM | POA: Diagnosis not present

## 2021-10-04 DIAGNOSIS — M810 Age-related osteoporosis without current pathological fracture: Secondary | ICD-10-CM | POA: Diagnosis not present

## 2021-10-04 DIAGNOSIS — J449 Chronic obstructive pulmonary disease, unspecified: Secondary | ICD-10-CM | POA: Diagnosis not present

## 2021-10-04 DIAGNOSIS — E039 Hypothyroidism, unspecified: Secondary | ICD-10-CM | POA: Diagnosis not present

## 2021-10-04 DIAGNOSIS — E785 Hyperlipidemia, unspecified: Secondary | ICD-10-CM | POA: Diagnosis not present

## 2021-10-10 DIAGNOSIS — M791 Myalgia, unspecified site: Secondary | ICD-10-CM | POA: Diagnosis not present

## 2021-10-10 DIAGNOSIS — R519 Headache, unspecified: Secondary | ICD-10-CM | POA: Diagnosis not present

## 2021-10-10 DIAGNOSIS — G518 Other disorders of facial nerve: Secondary | ICD-10-CM | POA: Diagnosis not present

## 2021-10-10 DIAGNOSIS — M542 Cervicalgia: Secondary | ICD-10-CM | POA: Diagnosis not present

## 2021-10-18 ENCOUNTER — Other Ambulatory Visit: Payer: Self-pay | Admitting: Psychiatry

## 2021-10-18 DIAGNOSIS — F4310 Post-traumatic stress disorder, unspecified: Secondary | ICD-10-CM

## 2021-10-18 DIAGNOSIS — F3162 Bipolar disorder, current episode mixed, moderate: Secondary | ICD-10-CM

## 2021-10-18 DIAGNOSIS — F4001 Agoraphobia with panic disorder: Secondary | ICD-10-CM

## 2021-10-18 DIAGNOSIS — F411 Generalized anxiety disorder: Secondary | ICD-10-CM

## 2021-10-24 ENCOUNTER — Ambulatory Visit: Payer: Medicare HMO | Admitting: Psychiatry

## 2021-10-27 DIAGNOSIS — G518 Other disorders of facial nerve: Secondary | ICD-10-CM | POA: Diagnosis not present

## 2021-10-27 DIAGNOSIS — M791 Myalgia, unspecified site: Secondary | ICD-10-CM | POA: Diagnosis not present

## 2021-10-27 DIAGNOSIS — M542 Cervicalgia: Secondary | ICD-10-CM | POA: Diagnosis not present

## 2021-10-27 DIAGNOSIS — R519 Headache, unspecified: Secondary | ICD-10-CM | POA: Diagnosis not present

## 2021-11-02 DIAGNOSIS — J449 Chronic obstructive pulmonary disease, unspecified: Secondary | ICD-10-CM | POA: Diagnosis not present

## 2021-11-02 DIAGNOSIS — E785 Hyperlipidemia, unspecified: Secondary | ICD-10-CM | POA: Diagnosis not present

## 2021-11-07 ENCOUNTER — Ambulatory Visit: Payer: Medicare HMO | Admitting: Psychiatry

## 2021-11-08 ENCOUNTER — Encounter: Payer: Self-pay | Admitting: Podiatry

## 2021-11-08 ENCOUNTER — Ambulatory Visit (INDEPENDENT_AMBULATORY_CARE_PROVIDER_SITE_OTHER): Payer: Medicare HMO

## 2021-11-08 ENCOUNTER — Ambulatory Visit: Payer: Medicare HMO | Admitting: Psychiatry

## 2021-11-08 ENCOUNTER — Other Ambulatory Visit: Payer: Self-pay

## 2021-11-08 ENCOUNTER — Ambulatory Visit: Payer: Medicare HMO | Admitting: Podiatry

## 2021-11-08 DIAGNOSIS — M2010 Hallux valgus (acquired), unspecified foot: Secondary | ICD-10-CM

## 2021-11-08 DIAGNOSIS — M5090 Cervical disc disorder, unspecified, unspecified cervical region: Secondary | ICD-10-CM | POA: Insufficient documentation

## 2021-11-08 DIAGNOSIS — J449 Chronic obstructive pulmonary disease, unspecified: Secondary | ICD-10-CM | POA: Insufficient documentation

## 2021-11-08 DIAGNOSIS — M2011 Hallux valgus (acquired), right foot: Secondary | ICD-10-CM | POA: Diagnosis not present

## 2021-11-08 DIAGNOSIS — M21622 Bunionette of left foot: Secondary | ICD-10-CM

## 2021-11-08 DIAGNOSIS — M8588 Other specified disorders of bone density and structure, other site: Secondary | ICD-10-CM | POA: Insufficient documentation

## 2021-11-08 DIAGNOSIS — E039 Hypothyroidism, unspecified: Secondary | ICD-10-CM | POA: Insufficient documentation

## 2021-11-08 DIAGNOSIS — R0989 Other specified symptoms and signs involving the circulatory and respiratory systems: Secondary | ICD-10-CM | POA: Diagnosis not present

## 2021-11-08 DIAGNOSIS — M2012 Hallux valgus (acquired), left foot: Secondary | ICD-10-CM | POA: Diagnosis not present

## 2021-11-08 DIAGNOSIS — K219 Gastro-esophageal reflux disease without esophagitis: Secondary | ICD-10-CM | POA: Insufficient documentation

## 2021-11-08 DIAGNOSIS — M21621 Bunionette of right foot: Secondary | ICD-10-CM | POA: Diagnosis not present

## 2021-11-08 DIAGNOSIS — J301 Allergic rhinitis due to pollen: Secondary | ICD-10-CM | POA: Insufficient documentation

## 2021-11-08 DIAGNOSIS — I700 Atherosclerosis of aorta: Secondary | ICD-10-CM | POA: Insufficient documentation

## 2021-11-08 DIAGNOSIS — F17201 Nicotine dependence, unspecified, in remission: Secondary | ICD-10-CM | POA: Insufficient documentation

## 2021-11-08 DIAGNOSIS — G44209 Tension-type headache, unspecified, not intractable: Secondary | ICD-10-CM | POA: Insufficient documentation

## 2021-11-08 DIAGNOSIS — E559 Vitamin D deficiency, unspecified: Secondary | ICD-10-CM | POA: Insufficient documentation

## 2021-11-08 DIAGNOSIS — E785 Hyperlipidemia, unspecified: Secondary | ICD-10-CM | POA: Insufficient documentation

## 2021-11-08 DIAGNOSIS — E538 Deficiency of other specified B group vitamins: Secondary | ICD-10-CM | POA: Insufficient documentation

## 2021-11-08 DIAGNOSIS — E441 Mild protein-calorie malnutrition: Secondary | ICD-10-CM | POA: Insufficient documentation

## 2021-11-08 DIAGNOSIS — R1310 Dysphagia, unspecified: Secondary | ICD-10-CM | POA: Insufficient documentation

## 2021-11-10 ENCOUNTER — Telehealth: Payer: Self-pay | Admitting: Podiatry

## 2021-11-10 DIAGNOSIS — R519 Headache, unspecified: Secondary | ICD-10-CM | POA: Diagnosis not present

## 2021-11-10 DIAGNOSIS — M542 Cervicalgia: Secondary | ICD-10-CM | POA: Diagnosis not present

## 2021-11-10 DIAGNOSIS — M791 Myalgia, unspecified site: Secondary | ICD-10-CM | POA: Diagnosis not present

## 2021-11-10 DIAGNOSIS — G518 Other disorders of facial nerve: Secondary | ICD-10-CM | POA: Diagnosis not present

## 2021-11-10 NOTE — Telephone Encounter (Signed)
Patient called her heart care provider to see if they received the referral from our office and they said that nothing has been put in the system yet?  Patient goes to Dr Margaretann Loveless at [facility]

## 2021-11-10 NOTE — Telephone Encounter (Signed)
Called patient back and let her know that the appointment on 3/13 is for the Lower Arterial Test not for her to see her cardiologist.

## 2021-11-10 NOTE — Telephone Encounter (Signed)
Based on the order from the visit, her appointment is scheduled for 3/13 at [facility]. She does not see her regular cardiologist for the test

## 2021-11-10 NOTE — Telephone Encounter (Signed)
No worries. Thanks!

## 2021-11-11 ENCOUNTER — Telehealth: Payer: Self-pay | Admitting: Podiatry

## 2021-11-11 NOTE — Telephone Encounter (Signed)
Pt called and has discussed with her husband the surgery and has decided not to proceed with the surgery. I have canceled the surgical consult appt.

## 2021-11-12 NOTE — Telephone Encounter (Signed)
Noted thank you

## 2021-11-14 NOTE — Progress Notes (Signed)
°  Subjective:  Patient ID: [name], female    DOB: November 30, 1955,  MRN: [id number]  Chief Complaint  Patient presents with   Foot Pain    1st and 5th MPJ bilateral - bunion and tailor's bunion deformity x years, shoe gear getting more uncomfortable, interested in [place] Patient (Initial Visit)    66 y.o. female presents with the above complaint. History confirmed with patient.  She is referred to me by Dr. Milinda Pointer for surgical consultation for tailor's bunion and bunion deformity  Objective:  Physical Exam: warm, good capillary refill, no trophic changes or ulcerative lesions, normal sensory exam, and DP and PT pulses palpable but weak.  She has significant hallux valgus deformity.  Moderate hypermobility of the first ray.  Tailor's bunion of both feet..   Radiographs: Multiple views x-ray of both feet: Moderate hallux valgus deformity with bunion formation and tailor's bunions Assessment:   1. Acquired hallux valgus, unspecified laterality   2. Tailor's bunion of both feet   3. Decreased pedal pulses      Plan:  Patient was evaluated and treated and all questions answered.  Discussed the etiology and treatment including surgical and non surgical treatment for painful bunions and tailor's bunions.  She has exhausted all non surgical treatment prior to this visit including shoe gear changes and padding.  She desires surgical intervention. We discussed all risks including but not limited to: pain, swelling, infection, scar, numbness which may be temporary or permanent, chronic pain, stiffness, nerve pain or damage, wound healing problems, bone healing problems including delayed or non-union and recurrence. Specifically we discussed the following procedures: Lapidus arthrodesis, possible Akin, bone graft from the heel and tailor's bunionectomy.  Considering her previous and current smoking use I advised her to discontinue smoking.  We discussed the option of nicotine vaping.  I  did order noninvasive vascular testing to see if there is any evidence of peripheral arterial disease with her risk factor.  I plan to see her back in 1 month for a bunion surgery planning visit, following the visit I was informed she had discussed the surgery with her husband would like to hold off at this point she will follow-up as needed

## 2021-11-25 DIAGNOSIS — M542 Cervicalgia: Secondary | ICD-10-CM | POA: Diagnosis not present

## 2021-11-25 DIAGNOSIS — G518 Other disorders of facial nerve: Secondary | ICD-10-CM | POA: Diagnosis not present

## 2021-11-25 DIAGNOSIS — R519 Headache, unspecified: Secondary | ICD-10-CM | POA: Diagnosis not present

## 2021-11-25 DIAGNOSIS — M791 Myalgia, unspecified site: Secondary | ICD-10-CM | POA: Diagnosis not present

## 2021-11-28 ENCOUNTER — Encounter (HOSPITAL_COMMUNITY): Payer: Medicare HMO

## 2021-12-06 ENCOUNTER — Ambulatory Visit: Payer: Medicare HMO | Admitting: Podiatry

## 2021-12-13 DIAGNOSIS — M542 Cervicalgia: Secondary | ICD-10-CM | POA: Diagnosis not present

## 2021-12-13 DIAGNOSIS — M791 Myalgia, unspecified site: Secondary | ICD-10-CM | POA: Diagnosis not present

## 2021-12-13 DIAGNOSIS — R519 Headache, unspecified: Secondary | ICD-10-CM | POA: Diagnosis not present

## 2021-12-13 DIAGNOSIS — G518 Other disorders of facial nerve: Secondary | ICD-10-CM | POA: Diagnosis not present

## 2021-12-29 ENCOUNTER — Ambulatory Visit: Payer: Medicare HMO | Admitting: Psychiatry

## 2022-01-09 ENCOUNTER — Telehealth: Payer: Self-pay | Admitting: Physician Assistant

## 2022-01-09 NOTE — Telephone Encounter (Signed)
Made patient appointment for [facility] to look at lesion on face. ?

## 2022-01-09 NOTE — Telephone Encounter (Signed)
Wants to know if [facility] can do anything about "a very deep pore" on her face ?

## 2022-01-16 DIAGNOSIS — M7918 Myalgia, other site: Secondary | ICD-10-CM | POA: Diagnosis not present

## 2022-01-16 DIAGNOSIS — M25551 Pain in right hip: Secondary | ICD-10-CM | POA: Diagnosis not present

## 2022-01-16 DIAGNOSIS — M5451 Vertebrogenic low back pain: Secondary | ICD-10-CM | POA: Diagnosis not present

## 2022-01-19 DIAGNOSIS — R1032 Left lower quadrant pain: Secondary | ICD-10-CM | POA: Diagnosis not present

## 2022-01-19 DIAGNOSIS — R69 Illness, unspecified: Secondary | ICD-10-CM | POA: Diagnosis not present

## 2022-01-19 DIAGNOSIS — M810 Age-related osteoporosis without current pathological fracture: Secondary | ICD-10-CM | POA: Diagnosis not present

## 2022-01-19 DIAGNOSIS — E441 Mild protein-calorie malnutrition: Secondary | ICD-10-CM | POA: Diagnosis not present

## 2022-01-23 DIAGNOSIS — M791 Myalgia, unspecified site: Secondary | ICD-10-CM | POA: Diagnosis not present

## 2022-01-23 DIAGNOSIS — M542 Cervicalgia: Secondary | ICD-10-CM | POA: Diagnosis not present

## 2022-01-23 DIAGNOSIS — R519 Headache, unspecified: Secondary | ICD-10-CM | POA: Diagnosis not present

## 2022-01-23 DIAGNOSIS — G518 Other disorders of facial nerve: Secondary | ICD-10-CM | POA: Diagnosis not present

## 2022-02-02 ENCOUNTER — Other Ambulatory Visit: Payer: Self-pay | Admitting: Psychiatry

## 2022-02-02 DIAGNOSIS — F411 Generalized anxiety disorder: Secondary | ICD-10-CM

## 2022-02-02 DIAGNOSIS — F3181 Bipolar II disorder: Secondary | ICD-10-CM

## 2022-02-02 DIAGNOSIS — F4310 Post-traumatic stress disorder, unspecified: Secondary | ICD-10-CM

## 2022-02-02 DIAGNOSIS — F4001 Agoraphobia with panic disorder: Secondary | ICD-10-CM

## 2022-02-09 ENCOUNTER — Other Ambulatory Visit: Payer: Self-pay | Admitting: Psychiatry

## 2022-02-09 DIAGNOSIS — F4001 Agoraphobia with panic disorder: Secondary | ICD-10-CM

## 2022-02-09 DIAGNOSIS — F3181 Bipolar II disorder: Secondary | ICD-10-CM

## 2022-02-20 ENCOUNTER — Encounter: Payer: Self-pay | Admitting: Psychiatry

## 2022-02-20 ENCOUNTER — Ambulatory Visit: Payer: Medicare HMO | Admitting: Psychiatry

## 2022-02-20 ENCOUNTER — Other Ambulatory Visit: Payer: Self-pay

## 2022-02-20 DIAGNOSIS — F4310 Post-traumatic stress disorder, unspecified: Secondary | ICD-10-CM

## 2022-02-20 DIAGNOSIS — F3162 Bipolar disorder, current episode mixed, moderate: Secondary | ICD-10-CM | POA: Diagnosis not present

## 2022-02-20 DIAGNOSIS — F4001 Agoraphobia with panic disorder: Secondary | ICD-10-CM | POA: Diagnosis not present

## 2022-02-20 DIAGNOSIS — F411 Generalized anxiety disorder: Secondary | ICD-10-CM | POA: Diagnosis not present

## 2022-02-20 DIAGNOSIS — F3181 Bipolar II disorder: Secondary | ICD-10-CM | POA: Diagnosis not present

## 2022-02-20 DIAGNOSIS — R69 Illness, unspecified: Secondary | ICD-10-CM | POA: Diagnosis not present

## 2022-02-20 MED ORDER — LURASIDONE HCL 120 MG PO TABS: 120.0000 mg | ORAL_TABLET | Freq: Every day | ORAL | 5 refills | Status: DC

## 2022-02-20 MED ORDER — CLONIDINE HCL 0.1 MG PO TABS: ORAL_TABLET | ORAL | 3 refills | Status: DC

## 2022-02-20 MED ORDER — ALPRAZOLAM 0.5 MG PO TABS: ORAL_TABLET | ORAL | 3 refills | Status: DC

## 2022-02-20 MED ORDER — CITALOPRAM HYDROBROMIDE 20 MG PO TABS: 20.0000 mg | ORAL_TABLET | Freq: Every day | ORAL | 1 refills | Status: DC
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
## 2022-03-03 ENCOUNTER — Other Ambulatory Visit: Payer: Self-pay | Admitting: Psychiatry

## 2022-03-03 DIAGNOSIS — F3181 Bipolar II disorder: Secondary | ICD-10-CM

## 2022-03-03 DIAGNOSIS — F4310 Post-traumatic stress disorder, unspecified: Secondary | ICD-10-CM

## 2022-03-03 DIAGNOSIS — F4001 Agoraphobia with panic disorder: Secondary | ICD-10-CM

## 2022-03-03 DIAGNOSIS — F411 Generalized anxiety disorder: Secondary | ICD-10-CM

## 2022-03-06 DIAGNOSIS — R519 Headache, unspecified: Secondary | ICD-10-CM | POA: Diagnosis not present

## 2022-03-06 DIAGNOSIS — M791 Myalgia, unspecified site: Secondary | ICD-10-CM | POA: Diagnosis not present

## 2022-03-06 DIAGNOSIS — G518 Other disorders of facial nerve: Secondary | ICD-10-CM | POA: Diagnosis not present

## 2022-03-06 DIAGNOSIS — M542 Cervicalgia: Secondary | ICD-10-CM | POA: Diagnosis not present

## 2022-03-06 DIAGNOSIS — E785 Hyperlipidemia, unspecified: Secondary | ICD-10-CM | POA: Diagnosis not present

## 2022-03-06 DIAGNOSIS — R69 Illness, unspecified: Secondary | ICD-10-CM | POA: Diagnosis not present

## 2022-03-06 DIAGNOSIS — J449 Chronic obstructive pulmonary disease, unspecified: Secondary | ICD-10-CM | POA: Diagnosis not present

## 2022-03-27 DIAGNOSIS — E785 Hyperlipidemia, unspecified: Secondary | ICD-10-CM | POA: Diagnosis not present

## 2022-03-27 DIAGNOSIS — K219 Gastro-esophageal reflux disease without esophagitis: Secondary | ICD-10-CM | POA: Diagnosis not present

## 2022-03-27 DIAGNOSIS — E039 Hypothyroidism, unspecified: Secondary | ICD-10-CM | POA: Diagnosis not present

## 2022-03-27 DIAGNOSIS — R69 Illness, unspecified: Secondary | ICD-10-CM | POA: Diagnosis not present

## 2022-03-27 DIAGNOSIS — M810 Age-related osteoporosis without current pathological fracture: Secondary | ICD-10-CM | POA: Diagnosis not present

## 2022-03-27 DIAGNOSIS — J0100 Acute maxillary sinusitis, unspecified: Secondary | ICD-10-CM | POA: Diagnosis not present

## 2022-03-27 DIAGNOSIS — J449 Chronic obstructive pulmonary disease, unspecified: Secondary | ICD-10-CM | POA: Diagnosis not present

## 2022-03-27 DIAGNOSIS — J069 Acute upper respiratory infection, unspecified: Secondary | ICD-10-CM | POA: Diagnosis not present

## 2022-03-30 ENCOUNTER — Other Ambulatory Visit: Payer: Self-pay | Admitting: Psychiatry

## 2022-03-30 DIAGNOSIS — F4001 Agoraphobia with panic disorder: Secondary | ICD-10-CM

## 2022-03-30 DIAGNOSIS — F3181 Bipolar II disorder: Secondary | ICD-10-CM

## 2022-03-30 NOTE — Telephone Encounter (Signed)
Filled 5/25 appt 10/5

## 2022-04-13 ENCOUNTER — Ambulatory Visit: Payer: Medicare HMO | Admitting: Physician Assistant

## 2022-04-13 ENCOUNTER — Encounter: Payer: Self-pay | Admitting: Physician Assistant

## 2022-04-13 DIAGNOSIS — L708 Other acne: Secondary | ICD-10-CM | POA: Diagnosis not present

## 2022-04-13 DIAGNOSIS — D485 Neoplasm of uncertain behavior of skin: Secondary | ICD-10-CM | POA: Diagnosis not present

## 2022-04-13 NOTE — Patient Instructions (Signed)

## 2022-04-22 DIAGNOSIS — S0181XA Laceration without foreign body of other part of head, initial encounter: Secondary | ICD-10-CM | POA: Diagnosis not present

## 2022-04-26 DIAGNOSIS — M791 Myalgia, unspecified site: Secondary | ICD-10-CM | POA: Diagnosis not present

## 2022-04-26 DIAGNOSIS — G518 Other disorders of facial nerve: Secondary | ICD-10-CM | POA: Diagnosis not present

## 2022-04-26 DIAGNOSIS — M542 Cervicalgia: Secondary | ICD-10-CM | POA: Diagnosis not present

## 2022-04-26 DIAGNOSIS — R519 Headache, unspecified: Secondary | ICD-10-CM | POA: Diagnosis not present

## 2022-04-27 ENCOUNTER — Encounter: Payer: Self-pay | Admitting: Physician Assistant

## 2022-04-27 NOTE — Progress Notes (Signed)
   Follow-Up Visit   Subjective  [name] is a 66 y.o. female who presents for the following: Skin Problem (Patient here today for lesion on her right cheek and left cheek x 1 year that she would like removed. Patient also has a lesion on her left chest x 1 month that she would like removed. ).   The following portions of the chart were reviewed this encounter and updated as appropriate:  Tobacco  Allergies  Meds  Problems  Med Hx  Surg Hx  Fam Hx      Objective  Well appearing patient in no apparent distress; mood and affect are within normal limits.  A focused examination was performed including face. Relevant physical exam findings are noted in the Assessment and Plan.  Left Malar Cheek Enlarged pore  Right Malar Cheek Enlarged pore   Assessment & Plan  Neoplasm of uncertain behavior of skin (2) Left Malar Cheek  Skin / nail biopsy Type of biopsy: punch   Informed consent: discussed and consent obtained   Procedure prep:  Patient was prepped and draped in usual sterile fashion (nonsterile) Prep type:  Chlorhexidine Anesthesia: the lesion was anesthetized in a standard fashion   Anesthetic:  1% lidocaine w/ epinephrine 1-100,000 local infiltration Punch size:  3 mm Suture size:  5-0 Suture type: nylon   Suture removal (days):  9 Outcome: patient tolerated procedure well   Post-procedure details: wound care instructions given   Post-procedure details comment:  Nonsterile Additional details:  5-0 nylon x2  Specimen 1 - Surgical pathology Differential Diagnosis: r/o cyst  Check Margins: No  Right Malar Cheek  Skin / nail biopsy Type of biopsy: punch   Informed consent: discussed and consent obtained   Procedure prep:  Patient was prepped and draped in usual sterile fashion (nonsterile) Prep type:  Chlorhexidine Anesthesia: the lesion was anesthetized in a standard fashion   Anesthetic:  1% lidocaine w/ epinephrine 1-100,000 local infiltration Punch  size:  3 mm Suture size:  5-0 Outcome: patient tolerated procedure well   Post-procedure details: wound care instructions given   Post-procedure details comment:  Nonsterile  Specimen 2 - Surgical pathology Differential Diagnosis: r/o cyst  Check Margins: No    I, Azalie Harbeck, PA-C, have reviewed all documentation's for this visit.  The documentation on 04/27/22 for the exam, diagnosis, procedures and orders are all accurate and complete.

## 2022-05-17 ENCOUNTER — Ambulatory Visit
Admission: RE | Admit: 2022-05-17 | Discharge: 2022-05-17 | Disposition: A | Discharge status: Home / Self Care | Payer: Medicare HMO | Source: Ambulatory Visit | Attending: Family Medicine | Admitting: Family Medicine

## 2022-05-17 DIAGNOSIS — F17210 Nicotine dependence, cigarettes, uncomplicated: Secondary | ICD-10-CM

## 2022-05-17 DIAGNOSIS — Z87891 Personal history of nicotine dependence: Secondary | ICD-10-CM

## 2022-05-17 DIAGNOSIS — R69 Illness, unspecified: Secondary | ICD-10-CM | POA: Diagnosis not present

## 2022-05-19 ENCOUNTER — Telehealth: Payer: Self-pay | Admitting: *Deleted

## 2022-05-19 DIAGNOSIS — Z87891 Personal history of nicotine dependence: Secondary | ICD-10-CM

## 2022-05-19 DIAGNOSIS — R911 Solitary pulmonary nodule: Secondary | ICD-10-CM

## 2022-05-19 NOTE — Telephone Encounter (Signed)
Spoke with pt regarding lung screening CT results. I explained that there were a few new small nodules seen on this exam and we will plane to repeat CT in 6 months instead of waiting a year. Also advised of coronary calcifications seen. Pt is on statin. Pt verbalized understanding and had no further questions. Results faxed to PCP.Marland Kitchen Order placed for 6 mth nodule f/u LCs CT.

## 2022-06-07 DIAGNOSIS — M791 Myalgia, unspecified site: Secondary | ICD-10-CM | POA: Diagnosis not present

## 2022-06-07 DIAGNOSIS — G518 Other disorders of facial nerve: Secondary | ICD-10-CM | POA: Diagnosis not present

## 2022-06-07 DIAGNOSIS — R519 Headache, unspecified: Secondary | ICD-10-CM | POA: Diagnosis not present

## 2022-06-07 DIAGNOSIS — M542 Cervicalgia: Secondary | ICD-10-CM | POA: Diagnosis not present

## 2022-06-08 DIAGNOSIS — M25561 Pain in right knee: Secondary | ICD-10-CM | POA: Diagnosis not present

## 2022-06-08 DIAGNOSIS — M25562 Pain in left knee: Secondary | ICD-10-CM | POA: Diagnosis not present

## 2022-06-09 DIAGNOSIS — R7301 Impaired fasting glucose: Secondary | ICD-10-CM | POA: Diagnosis not present

## 2022-06-09 DIAGNOSIS — E039 Hypothyroidism, unspecified: Secondary | ICD-10-CM | POA: Diagnosis not present

## 2022-06-09 DIAGNOSIS — R69 Illness, unspecified: Secondary | ICD-10-CM | POA: Diagnosis not present

## 2022-06-09 DIAGNOSIS — Z0000 Encounter for general adult medical examination without abnormal findings: Secondary | ICD-10-CM | POA: Diagnosis not present

## 2022-06-09 DIAGNOSIS — Z79899 Other long term (current) drug therapy: Secondary | ICD-10-CM | POA: Diagnosis not present

## 2022-06-09 DIAGNOSIS — J449 Chronic obstructive pulmonary disease, unspecified: Secondary | ICD-10-CM | POA: Diagnosis not present

## 2022-06-09 DIAGNOSIS — Z23 Encounter for immunization: Secondary | ICD-10-CM | POA: Diagnosis not present

## 2022-06-09 DIAGNOSIS — E559 Vitamin D deficiency, unspecified: Secondary | ICD-10-CM | POA: Diagnosis not present

## 2022-06-09 DIAGNOSIS — E538 Deficiency of other specified B group vitamins: Secondary | ICD-10-CM | POA: Diagnosis not present

## 2022-06-09 DIAGNOSIS — M810 Age-related osteoporosis without current pathological fracture: Secondary | ICD-10-CM | POA: Diagnosis not present

## 2022-06-09 DIAGNOSIS — E441 Mild protein-calorie malnutrition: Secondary | ICD-10-CM | POA: Diagnosis not present

## 2022-06-09 DIAGNOSIS — K219 Gastro-esophageal reflux disease without esophagitis: Secondary | ICD-10-CM | POA: Diagnosis not present

## 2022-06-09 DIAGNOSIS — I700 Atherosclerosis of aorta: Secondary | ICD-10-CM | POA: Diagnosis not present

## 2022-06-09 DIAGNOSIS — R2689 Other abnormalities of gait and mobility: Secondary | ICD-10-CM | POA: Diagnosis not present

## 2022-06-09 DIAGNOSIS — E785 Hyperlipidemia, unspecified: Secondary | ICD-10-CM | POA: Diagnosis not present

## 2022-06-12 DIAGNOSIS — R69 Illness, unspecified: Secondary | ICD-10-CM | POA: Diagnosis not present

## 2022-06-12 DIAGNOSIS — J449 Chronic obstructive pulmonary disease, unspecified: Secondary | ICD-10-CM | POA: Diagnosis not present

## 2022-06-12 DIAGNOSIS — E785 Hyperlipidemia, unspecified: Secondary | ICD-10-CM | POA: Diagnosis not present

## 2022-06-22 ENCOUNTER — Ambulatory Visit: Payer: Medicare HMO | Admitting: Psychiatry

## 2022-07-05 ENCOUNTER — Other Ambulatory Visit: Payer: Self-pay | Admitting: Family Medicine

## 2022-07-05 DIAGNOSIS — M810 Age-related osteoporosis without current pathological fracture: Secondary | ICD-10-CM

## 2022-07-07 DIAGNOSIS — E785 Hyperlipidemia, unspecified: Secondary | ICD-10-CM | POA: Diagnosis not present

## 2022-07-07 DIAGNOSIS — J449 Chronic obstructive pulmonary disease, unspecified: Secondary | ICD-10-CM | POA: Diagnosis not present

## 2022-07-11 DIAGNOSIS — M25561 Pain in right knee: Secondary | ICD-10-CM | POA: Diagnosis not present

## 2022-07-12 DIAGNOSIS — M791 Myalgia, unspecified site: Secondary | ICD-10-CM | POA: Diagnosis not present

## 2022-07-12 DIAGNOSIS — R519 Headache, unspecified: Secondary | ICD-10-CM | POA: Diagnosis not present

## 2022-07-12 DIAGNOSIS — G518 Other disorders of facial nerve: Secondary | ICD-10-CM | POA: Diagnosis not present

## 2022-07-12 DIAGNOSIS — M542 Cervicalgia: Secondary | ICD-10-CM | POA: Diagnosis not present

## 2022-07-24 ENCOUNTER — Other Ambulatory Visit: Payer: Self-pay

## 2022-07-24 ENCOUNTER — Encounter: Payer: Self-pay | Admitting: Pulmonary Disease

## 2022-07-24 ENCOUNTER — Ambulatory Visit: Payer: Medicare HMO | Admitting: Pulmonary Disease

## 2022-07-24 VITALS — BP 110/70 | HR 79 | Ht 67.0 in | Wt 114.6 lb

## 2022-07-24 DIAGNOSIS — J432 Centrilobular emphysema: Secondary | ICD-10-CM | POA: Diagnosis not present

## 2022-07-24 DIAGNOSIS — R911 Solitary pulmonary nodule: Secondary | ICD-10-CM

## 2022-07-24 DIAGNOSIS — Z87891 Personal history of nicotine dependence: Secondary | ICD-10-CM | POA: Diagnosis not present

## 2022-07-24 DIAGNOSIS — R69 Illness, unspecified: Secondary | ICD-10-CM | POA: Diagnosis not present

## 2022-07-24 DIAGNOSIS — F17210 Nicotine dependence, cigarettes, uncomplicated: Secondary | ICD-10-CM

## 2022-07-24 MED ORDER — BREZTRI AEROSPHERE 160-9-4.8 MCG/ACT IN AERO: 2.0000 | INHALATION_SPRAY | Freq: Two times a day (BID) | RESPIRATORY_TRACT | 6 refills | Status: DC

## 2022-07-24 NOTE — Progress Notes (Signed)
Synopsis: Referred in November 2023 for COPD. Still smoking as of 07/2022  Subjective:   PATIENT ID: [name] GENDER: female DOB: 1956/02/16, MRN: [id number]   HPI  No chief complaint on file. CC: referred by Dr. Dema Severin for COPD  COPD: > has been diagnosed by Dr. Dema Severin > runs in her family  Dyspnea:  > has been going on for a year or more > she notices is when she is walking her dog, trying to go up a hill > she limits her activity due to dyspnea > sometimes it just happens when she is at rest > she'll have chest tightness when she is dyspneic > trelegy is just OK but she doesn't like the powder in her mouth > she's not taking Trelegy regularly  > Albuterol helps when she takes it  > she needs albuterol fairly often, several times a day  COPD exacerbation: > she gets "upper respiratory infections" more than one time per year > she'll be treated with prednisone and other medicines.  She'll go to urgent care.  Cigarette smoker: > she still smokes > she was able to quit smoking for 3 years > she has a lot of stress in her life that makes it hard for her to quit > she wants to quit, she can't stand the smell of it, but it helps with her stress > previously she stopped while vaping and using a nicotine patch  Has been told she has a pulmonary nodule: > recently seen on lung cancer screening   Record review: Various primary care notes indicate she has been cared for COPD.  She has been enrolled in the lung cancer screening program since July 2021.  RADS 2 imaging noted.  Past Medical History:  Diagnosis Date   Bipolar disorder ([place])    PT ON TOPIRAMATE AND ZANAX- SEES PSYCHIATRIST DR. COTTLE - PT STATES DOING WELL ON MEDICATIONS   COPD (chronic obstructive pulmonary disease) ([place])    OCCAS USE OF INHALER-QUIT SMOKING 2014 - USING E- [employer]   Diverticulitis    GERD (gastroesophageal reflux disease)    H/O suicide attempt 2007   PT'S SON HAD COMMITTED SUICIDE  AND SHE WAS OVER COME WITH GRIEF   Hypothyroidism    IBS (irritable bowel syndrome)    Lichen simplex chronicus    MVP (mitral valve prolapse)    DOES NOT CAUSE ANY PROBLEMS   Osteoporosis    Pain    RIGHT SHOULDER AND LIMITED ROM - -RT SHOULDER IMPINGEMENT, ADHESIVE CAPSULITIS, ROTATOR CUFF TEAR   Pain    NECK PAIN -PT HOPING SURGERY ON HER SHOULDER HELPS HER NECK PAIN- DOES HAVE HX OF CERBVICAL DISK FUSION.   Pain    LUMBAR PAIN - DDD AND PREVIOUS LUMBAR SURGERY   Vitamin D deficiency      Family History  Problem Relation Age of Onset   Heart failure Mother    Heart disease Mother    Stroke Mother    Heart disease Father    Heart attack Paternal Grandmother      Social History   Socioeconomic History   Marital status: Married    Spouse name: Not on file   Number of children: Not on file   Years of education: Not on file   Highest education level: Not on file  Occupational History   Not on file  Tobacco Use   Smoking status: Every Day    Packs/day: 0.75    Years: 55.00  Total pack years: 41.25    Types: Cigarettes   Smokeless tobacco: Current   Tobacco comments:    Smoking 4 1/2 packs of cigs a week. 07/24/2022 [place]  Substance and Sexual Activity   Alcohol use: No   Drug use: No   Sexual activity: Not on file  Other Topics Concern   Not on file  Social History Narrative   Right handed   Some caffeine- 1.2 cups per day    Lives at home with husband    Social Determinants of Health   Financial Resource Strain: Not on file  Food Insecurity: Not on file  Transportation Needs: Not on file  Physical Activity: Not on file  Stress: Not on file  Social Connections: Not on file  Intimate Partner Violence: Not on file     Allergies  Allergen Reactions   Alendronate Sodium Shortness Of Breath   Fiorinal [Butalbital-Aspirin-Caffeine]     SWELLING OF LIPS AND HIVES   Hydrocodone Bit-Homatrop Mbr     Other reaction(s): nausea   Iodinated Contrast Media      Other reaction(s): hives   Iohexol      Code: HIVES, Desc: omnipaque 300-doc'd by KB on 04-13-06.Marland KitchenMarland Kitchenpt states she gets severe hives    Klonopin [Clonazepam]     PT STATES SHE OVERDOSED ON KLONOPIN   Levothyroxine     Other reaction(s): hives with the generic   Other     THE PURPLE DYE IN GENERIC SYNTHROID ( LEVOTHYROXINE ) CAUSED BREAKING OUT FACE AND SCALP.  PT CAN TAKE SYNTHROID.   Strawberry (Diagnostic)     Other reaction(s): canker sores in mouth   Tramadol Other (See Comments)    depression   Tramadol-Acetaminophen     Other reaction(s): depressed   Dexilant [Dexlansoprazole] Diarrhea     Outpatient Medications Prior to Visit  Medication Sig Dispense Refill   albuterol (PROVENTIL HFA;VENTOLIN HFA) 108 (90 BASE) MCG/ACT inhaler Inhale 1 puff into the lungs every 6 (six) hours as needed for wheezing or shortness of breath.     ipratropium (ATROVENT) 0.06 % nasal spray Place 2 sprays into both nostrils 4 (four) times daily.     lamoTRIgine (LAMICTAL) 25 MG tablet Take 50 mg by mouth 2 (two) times daily.     TRELEGY ELLIPTA 100-62.5-25 MCG/INH AEPB Inhale 1 puff into the lungs daily.     Acetylcysteine (N-ACETYL-L-CYSTEINE) 600 MG CAPS Take 600 mg by mouth daily.     ALPRAZolam (XANAX) 0.5 MG tablet TAKE 1 TABLET BY MOUTH IN THE MORNING, AT NOON IN THE EVENING AND AT BEDTIME EVERY DAY 120 tablet 3   aspirin EC 81 MG tablet Take 1 tablet (81 mg total) by mouth daily. Swallow whole. 90 tablet 3   atorvastatin (LIPITOR) 80 MG tablet Take 1 tablet (80 mg total) by mouth daily. 90 tablet 3   citalopram (CELEXA) 20 MG tablet Take 1 tablet (20 mg total) by mouth daily. 90 tablet 1   cloNIDine (CATAPRES) 0.1 MG tablet TAKE 1 IN THE MORNING AND 2 TABLETS AT NIGHT 90 tablet 3   [employer] ALLERGY RELIEF 25 MG tablet Take by mouth.     denosumab (PROLIA) 60 MG/ML SOLN injection Inject 60 mg into the skin every 6 (six) months. Administer in upper arm, thigh, or abdomen     dexlansoprazole (DEXILANT) 60  MG capsule Take 60 mg by mouth daily.     diphenhydrAMINE (BENADRYL) 50 MG tablet Take 1 tablet (50 mg total) by mouth once for  1 dose. Pt to take 50 mg of benadryl on 08/03/21 at 10:40 am. Please call [phone] with any questions. 1 tablet 0   fluticasone (FLONASE) 50 MCG/ACT nasal spray Place into both nostrils.     Lurasidone HCl 120 MG TABS Take 1 tablet (120 mg total) by mouth daily. 30 tablet 5   omeprazole (PRILOSEC) 40 MG capsule Take 40 mg by mouth daily.     SYNTHROID 88 MCG tablet Take 88 mcg by mouth daily.     topiramate (TOPAMAX) 100 MG tablet TAKE 1 AND 1/2 TABLETS DAILY BY MOUTH 45 tablet 3   Vitamin D, Ergocalciferol, (DRISDOL) 50000 UNITS CAPS capsule Take 50,000 Units by mouth every 7 (seven) days.     No facility-administered medications prior to visit.    Review of Systems  Constitutional:  Negative for chills, fever, malaise/fatigue and weight loss.  HENT:  Negative for congestion, nosebleeds, sinus pain and sore throat.   Eyes:  Negative for photophobia, pain and discharge.  Respiratory:  Positive for cough, sputum production and shortness of breath. Negative for hemoptysis and wheezing.   Cardiovascular:  Negative for chest pain, palpitations, orthopnea and leg swelling.  Gastrointestinal:  Negative for abdominal pain, constipation, diarrhea, nausea and vomiting.  Genitourinary:  Negative for dysuria, frequency, hematuria and urgency.  Musculoskeletal:  Negative for back pain, joint pain, myalgias and neck pain.  Skin:  Negative for itching and rash.  Neurological:  Negative for tingling, tremors, sensory change, speech change, focal weakness, seizures, weakness and headaches.  Psychiatric/Behavioral:  Negative for memory loss, substance abuse and suicidal ideas. The patient is not nervous/anxious.       Objective:  Physical Exam   Vitals:   07/24/22 1035  BP: 110/70  Pulse: 79  SpO2: 98%  Weight: 114 lb 9.6 oz (52 kg)  Height: '5\' 7"'$  (1.702 m)     Gen: well appearing, no acute distress HENT: NCAT, OP clear, neck supple without masses Eyes: PERRL, EOMi Lymph: no cervical lymphadenopathy PULM: CTA B CV: RRR, no mgr, no JVD GI: BS+, soft, nontender, no hsm Derm: no rash or skin breakdown MSK: normal bulk and tone Neuro: A&Ox4, CN II-XII intact, strength 5/5 in all 4 extremities Psyche: normal mood and affect   CBC    Component Value Date/Time   WBC 7.7 07/08/2021 1705   RBC 4.20 07/08/2021 1705   HGB 13.0 07/08/2021 1705   HCT 38.9 07/08/2021 1705   PLT 234 07/08/2021 1705   MCV 92.6 07/08/2021 1705   MCH 31.0 07/08/2021 1705   MCHC 33.4 07/08/2021 1705   RDW 13.7 07/08/2021 1705   LYMPHSABS 2.5 07/08/2021 1705   MONOABS 0.7 07/08/2021 1705   EOSABS 0.1 07/08/2021 1705   BASOSABS 0.1 07/08/2021 1705     Chest imaging: August 2023 CT chest screening CT RADS 3, follow-up 3 months recommended, coronary artery disease, emphysema, aortic atherosclerosis noted.  Images independently reviewed showing moderate to severe upper lobe predominant centrilobular emphysema and subsegmental atelectasis in the bases. 4.6 mm nodule RUL noted  PFT: November 2023 spirometry ratio 67%, FEV1 2.40 L 91% predicted  Labs:  Path:  Echo: 2022 echocardiogram LVEF 60 to 65%, RV size and function normal, bicuspid aortic valve, mitral valve okay  Heart Catheterization:       Assessment & Plan:   Centrilobular emphysema ([place])  Personal history of tobacco use, presenting hazards to health  Lung nodule  Moderate cigarette smoker (10-19 per day)  Discussion: Mareena is here to establish  care for COPD, a pulmonary nodule and cigarette smoking.  She has Gold grade E COPD and that she has recurrent exacerbations so we really need to focus on breaking the cycle.  The best way to do that moving forward is to get her to quit smoking and convince her to start taking a regular controller medicine.  She has had significant side effects from  Trelegy so she is unable to take that.  Plan: COPD, Gold grade E Spirometry test Check alpha 1 anti-trypsin level/genotype Stop Trelegy Start Breztri 2 puffs twice a day Continue using albuterol as needed for chest tightness wheezing or shortness of breath Practice good hand hygiene I am glad that you are immunizations are up-to-date Consider taking the RSV vaccine Stay physically active, exercise regularly  Pulmonary nodule seen on lung cancer screening CT scan: We will reach out to the lung cancer screening program to make sure that you have a repeat CT scan arranged for February 2024.  Cigarette smoking: As we discussed today I think it is important for you to quit smoking right away Use nicotine replacement, you can get free patches from the state of [place].  I recommend using 14 mcg daily Call 1-800-quit-NOW to get free nicotine replacement from the state As we discussed today, using electronic cigarettes as a short-term bridge to quit smoking cigarettes is reasonable as long as you create a plan to stop smoking using the electronic cigarettes as well  We will see you back in 4 weeks with a nurse practitioner or sooner if needed  Immunizations: Immunization History  Administered Date(s) Administered   DTaP / Hep B / IPV 06/04/2017, 12/03/2017, 06/06/2018, 12/06/2018, 06/09/2019, 12/08/2019, 06/10/2020, 12/09/2020, 06/15/2021   Influenza Inj Mdck Quad Pf 05/29/2018   Influenza Split 07/07/2008, 06/30/2012, 07/04/2017   Influenza, High Dose Seasonal PF 05/30/2021   Influenza,inj,Quad PF,6+ Mos 06/15/2016, 05/29/2018, 05/20/2019   PFIZER(Purple Top)SARS-COV-2 Vaccination 12/27/2019, 01/20/2020, 08/29/2020   Pneumococcal Conjugate-13 05/27/2020   Pneumococcal Polysaccharide-23 08/30/2012, 05/30/2021   Tdap 08/30/2012   Zoster, Live 03/15/2016     Current Outpatient Medications:    albuterol (PROVENTIL HFA;VENTOLIN HFA) 108 (90 BASE) MCG/ACT inhaler, Inhale 1 puff  into the lungs every 6 (six) hours as needed for wheezing or shortness of breath., Disp: , Rfl:    ipratropium (ATROVENT) 0.06 % nasal spray, Place 2 sprays into both nostrils 4 (four) times daily., Disp: , Rfl:    lamoTRIgine (LAMICTAL) 25 MG tablet, Take 50 mg by mouth 2 (two) times daily., Disp: , Rfl:    TRELEGY ELLIPTA 100-62.5-25 MCG/INH AEPB, Inhale 1 puff into the lungs daily., Disp: , Rfl:    Acetylcysteine (N-ACETYL-L-CYSTEINE) 600 MG CAPS, Take 600 mg by mouth daily., Disp: , Rfl:    ALPRAZolam (XANAX) 0.5 MG tablet, TAKE 1 TABLET BY MOUTH IN THE MORNING, AT NOON IN THE EVENING AND AT BEDTIME EVERY DAY, Disp: 120 tablet, Rfl: 3   aspirin EC 81 MG tablet, Take 1 tablet (81 mg total) by mouth daily. Swallow whole., Disp: 90 tablet, Rfl: 3   atorvastatin (LIPITOR) 80 MG tablet, Take 1 tablet (80 mg total) by mouth daily., Disp: 90 tablet, Rfl: 3   citalopram (CELEXA) 20 MG tablet, Take 1 tablet (20 mg total) by mouth daily., Disp: 90 tablet, Rfl: 1   cloNIDine (CATAPRES) 0.1 MG tablet, TAKE 1 IN THE MORNING AND 2 TABLETS AT NIGHT, Disp: 90 tablet, Rfl: 3   [employer] ALLERGY RELIEF 25 MG tablet, Take by mouth., Disp: ,  Rfl:    denosumab (PROLIA) 60 MG/ML SOLN injection, Inject 60 mg into the skin every 6 (six) months. Administer in upper arm, thigh, or abdomen, Disp: , Rfl:    dexlansoprazole (DEXILANT) 60 MG capsule, Take 60 mg by mouth daily., Disp: , Rfl:    diphenhydrAMINE (BENADRYL) 50 MG tablet, Take 1 tablet (50 mg total) by mouth once for 1 dose. Pt to take 50 mg of benadryl on 08/03/21 at 10:40 am. Please call [phone] with any questions., Disp: 1 tablet, Rfl: 0   fluticasone (FLONASE) 50 MCG/ACT nasal spray, Place into both nostrils., Disp: , Rfl:    Lurasidone HCl 120 MG TABS, Take 1 tablet (120 mg total) by mouth daily., Disp: 30 tablet, Rfl: 5   omeprazole (PRILOSEC) 40 MG capsule, Take 40 mg by mouth daily., Disp: , Rfl:    SYNTHROID 88 MCG tablet, Take 88 mcg by mouth daily.,  Disp: , Rfl:    topiramate (TOPAMAX) 100 MG tablet, TAKE 1 AND 1/2 TABLETS DAILY BY MOUTH, Disp: 45 tablet, Rfl: 3   Vitamin D, Ergocalciferol, (DRISDOL) 50000 UNITS CAPS capsule, Take 50,000 Units by mouth every 7 (seven) days., Disp: , Rfl:

## 2022-07-24 NOTE — Patient Instructions (Signed)
COPD, Gold grade E Spirometry test Stop Trelegy Start Breztri 2 puffs twice a day Continue using albuterol as needed for chest tightness wheezing or shortness of breath Practice good hand hygiene I am glad that you are immunizations are up-to-date Consider taking the RSV vaccine Stay physically active, exercise regularly  Pulmonary nodule seen on lung cancer screening CT scan: We will reach out to the lung cancer screening program to make sure that you have a repeat CT scan arranged for February 2024.  Cigarette smoking: As we discussed today I think it is important for you to quit smoking right away Use nicotine replacement, you can get free patches from the state of [place].  I recommend using 14 mcg daily Call 1-800-quit-NOW to get free nicotine replacement from the state As we discussed today, using electronic cigarettes as a short-term bridge to quit smoking cigarettes is reasonable as long as you create a plan to stop smoking using the electronic cigarettes as well  We will see you back in 4 weeks with a nurse practitioner or sooner if needed

## 2022-08-08 ENCOUNTER — Ambulatory Visit: Payer: Medicare HMO | Admitting: Psychiatry

## 2022-08-11 ENCOUNTER — Other Ambulatory Visit: Payer: Self-pay | Admitting: Psychiatry

## 2022-08-11 DIAGNOSIS — F4310 Post-traumatic stress disorder, unspecified: Secondary | ICD-10-CM

## 2022-08-11 DIAGNOSIS — F4001 Agoraphobia with panic disorder: Secondary | ICD-10-CM

## 2022-08-11 DIAGNOSIS — F411 Generalized anxiety disorder: Secondary | ICD-10-CM

## 2022-08-14 DIAGNOSIS — M542 Cervicalgia: Secondary | ICD-10-CM | POA: Diagnosis not present

## 2022-08-14 DIAGNOSIS — G518 Other disorders of facial nerve: Secondary | ICD-10-CM | POA: Diagnosis not present

## 2022-08-14 DIAGNOSIS — M791 Myalgia, unspecified site: Secondary | ICD-10-CM | POA: Diagnosis not present

## 2022-08-14 DIAGNOSIS — R519 Headache, unspecified: Secondary | ICD-10-CM | POA: Diagnosis not present

## 2022-08-16 ENCOUNTER — Other Ambulatory Visit: Payer: Self-pay | Admitting: Psychiatry

## 2022-08-16 ENCOUNTER — Other Ambulatory Visit: Payer: Self-pay

## 2022-08-16 DIAGNOSIS — F4001 Agoraphobia with panic disorder: Secondary | ICD-10-CM

## 2022-08-16 DIAGNOSIS — F411 Generalized anxiety disorder: Secondary | ICD-10-CM

## 2022-08-16 DIAGNOSIS — E785 Hyperlipidemia, unspecified: Secondary | ICD-10-CM | POA: Diagnosis not present

## 2022-08-16 DIAGNOSIS — F4310 Post-traumatic stress disorder, unspecified: Secondary | ICD-10-CM

## 2022-08-16 DIAGNOSIS — J449 Chronic obstructive pulmonary disease, unspecified: Secondary | ICD-10-CM | POA: Diagnosis not present

## 2022-08-16 MED ORDER — CITALOPRAM HYDROBROMIDE 20 MG PO TABS: 20.0000 mg | ORAL_TABLET | Freq: Every day | ORAL | 0 refills | Status: DC

## 2022-08-17 DIAGNOSIS — M25561 Pain in right knee: Secondary | ICD-10-CM | POA: Diagnosis not present

## 2022-08-24 DIAGNOSIS — M1711 Unilateral primary osteoarthritis, right knee: Secondary | ICD-10-CM | POA: Diagnosis not present

## 2022-08-24 DIAGNOSIS — M25561 Pain in right knee: Secondary | ICD-10-CM | POA: Diagnosis not present

## 2022-08-30 ENCOUNTER — Ambulatory Visit: Payer: Medicare HMO | Admitting: Adult Health

## 2022-08-30 ENCOUNTER — Encounter: Payer: Self-pay | Admitting: Adult Health

## 2022-08-30 VITALS — BP 98/60 | HR 74 | Temp 97.9°F | Ht 67.0 in | Wt 113.4 lb

## 2022-08-30 DIAGNOSIS — Z720 Tobacco use: Secondary | ICD-10-CM | POA: Diagnosis not present

## 2022-08-30 DIAGNOSIS — J449 Chronic obstructive pulmonary disease, unspecified: Secondary | ICD-10-CM | POA: Diagnosis not present

## 2022-08-30 NOTE — Assessment & Plan Note (Signed)
COPD prone to recurrent exacerbations.  Improved symptom control on Breztri.  Encouraged on smoking cessation.  Check alpha-1 today.  Plan  Patient Instructions  Continue on Breztri 2 puffs twice daily, rinse after use Albuterol inhaler as needed Lab work today with alpha-1 testing Activity as tolerated High protein diet .  Follow-up with Dr. Lake Bells in 4 months and As needed    '

## 2022-08-30 NOTE — Assessment & Plan Note (Signed)
Smoking cessation  

## 2022-08-30 NOTE — Patient Instructions (Addendum)
Continue on Breztri 2 puffs twice daily, rinse after use Albuterol inhaler as needed Lab work today with alpha-1 testing Activity as tolerated High protein diet .  Follow-up with Dr. Lake Bells in 4 months and As needed

## 2022-08-30 NOTE — Progress Notes (Signed)
$'@Patient'a$  ID: [name], female    DOB: 12-22-1955, 66 y.o.   MRN: [id number]  Chief Complaint  Patient presents with   Follow-up    Referring provider: [name], MD  HPI: 66 year old female active smoker seen for pulmonary consult July 24, 2022 for COPD and pulmonary nodule   TEST/EVENTS :  November 2023 spirometry ratio 67%, FEV1 2.40 L 91% predicted   August 2023 CT chest screening CT RADS 3, follow-up 3 months recommended, coronary artery disease, emphysema, aortic atherosclerosis noted.  Images independently reviewed showing moderate to severe upper lobe predominant centrilobular emphysema and subsegmental atelectasis in the bases. 4.6 mm nodule RUL note   08/30/2022  Follow up : COPD , Pulmonary Nodule  Patient presents for a 1 month follow-up.  Patient was seen last visit for pulmonary consult.  Patient participates in the lung cancer screening program.  CT chest August 2023 showed moderate to severe emphysema and a 4.6 mm right upper lobe nodule.  Patient is prone to recurrent COPD flareups.  She was recommended on smoking cessation.  She was changed from Trelegy to [employer] inhaler.  Spirometry showed mild airflow obstruction with an FEV1 at 91% and ratio at 67.  Feels that Judithann Sauger is working better.  She says it is helping.  She denies any chest pain orthopnea or edema.  Under a lot of stress, husband has cancer. Son died. Trying to work on cutting back on smoking.  We discussed smoking cessation.  RSV, Flu shot and PVX are utd . Covid vaccine in past, declines covid vaccine booster.   Was sick last week, symptoms are improved.  Cough and congestion have decreased.  She is feeling better.  Very active at home, walks. Limited with knee pain .    Allergies  Allergen Reactions   Alendronate Sodium Shortness Of Breath   Fiorinal [Butalbital-Aspirin-Caffeine]     SWELLING OF LIPS AND HIVES   Hydrocodone Bit-Homatrop Mbr     Other reaction(s): nausea    Iodinated Contrast Media     Other reaction(s): hives   Iohexol      Code: HIVES, Desc: omnipaque 300-doc'd by KB on 04-13-06.Marland KitchenMarland Kitchenpt states she gets severe hives    Klonopin [Clonazepam]     PT STATES SHE OVERDOSED ON KLONOPIN   Levothyroxine     Other reaction(s): hives with the generic   Other     THE PURPLE DYE IN GENERIC SYNTHROID ( LEVOTHYROXINE ) CAUSED BREAKING OUT FACE AND SCALP.  PT CAN TAKE SYNTHROID.   Strawberry (Diagnostic)     Other reaction(s): canker sores in mouth   Tramadol Other (See Comments)    depression   Tramadol-Acetaminophen     Other reaction(s): depressed   Dexilant [Dexlansoprazole] Diarrhea    Immunization History  Administered Date(s) Administered   DTaP / Hep B / IPV 06/04/2017, 12/03/2017, 06/06/2018, 12/06/2018, 06/09/2019, 12/08/2019, 06/10/2020, 12/09/2020, 06/15/2021   Fluad Quad(high Dose 65+) 07/31/2022   Influenza Inj Mdck Quad Pf 05/29/2018   Influenza Split 07/07/2008, 06/30/2012, 07/04/2017   Influenza, High Dose Seasonal PF 05/30/2021   Influenza,inj,Quad PF,6+ Mos 06/15/2016, 05/29/2018, 05/20/2019   PFIZER(Purple Top)SARS-COV-2 Vaccination 12/27/2019, 01/20/2020, 08/29/2020   Pneumococcal Conjugate-13 05/27/2020   Pneumococcal Polysaccharide-23 08/30/2012, 05/30/2021   Respiratory Syncytial Virus Vaccine,Recomb Aduvanted(Arexvy) 08/18/2022   Tdap 08/30/2012   Zoster, Live 03/15/2016    Past Medical History:  Diagnosis Date   Bipolar disorder ([place])    PT ON TOPIRAMATE AND ZANAX- SEES PSYCHIATRIST DR. [place]  WELL ON MEDICATIONS   COPD (chronic obstructive pulmonary disease) ([place])    OCCAS USE OF INHALER-QUIT SMOKING 2014 - USING E- CIGARETTE   Diverticulitis    GERD (gastroesophageal reflux disease)    H/O suicide attempt 2007   PT'S SON HAD COMMITTED SUICIDE AND SHE WAS OVER COME WITH GRIEF   Hypothyroidism    IBS (irritable bowel syndrome)    Lichen simplex chronicus    MVP (mitral valve prolapse)    DOES  NOT CAUSE ANY PROBLEMS   Osteoporosis    Pain    RIGHT SHOULDER AND LIMITED ROM - -RT SHOULDER IMPINGEMENT, ADHESIVE CAPSULITIS, ROTATOR CUFF TEAR   Pain    NECK PAIN -PT HOPING SURGERY ON HER SHOULDER HELPS HER NECK PAIN- DOES HAVE HX OF CERBVICAL DISK FUSION.   Pain    LUMBAR PAIN - DDD AND PREVIOUS LUMBAR SURGERY   Vitamin D deficiency     Tobacco History: Social History   Tobacco Use  Smoking Status Every Day   Packs/day: 1.00   Years: 55.00   Total pack years: 55.00   Types: Cigarettes  Smokeless Tobacco Current  Tobacco Comments   Smoking 1/2 ppd.  Trying to cut back.  Trying to find vape flavor she likes.  08/30/2022 hfb   Ready to quit: No Counseling given: Yes Tobacco comments: Smoking 1/2 ppd.  Trying to cut back.  Trying to find vape flavor she likes.  08/30/2022 hfb   Outpatient Medications Prior to Visit  Medication Sig Dispense Refill   Acetylcysteine (N-ACETYL-L-CYSTEINE) 600 MG CAPS Take 600 mg by mouth daily.     albuterol (PROVENTIL HFA;VENTOLIN HFA) 108 (90 BASE) MCG/ACT inhaler Inhale 1 puff into the lungs every 6 (six) hours as needed for wheezing or shortness of breath.     ALPRAZolam (XANAX) 0.5 MG tablet TAKE 1 TABLET BY MOUTH IN THE MORNING, AT NOON IN THE EVENING AND AT BEDTIME EVERY DAY 120 tablet 3   aspirin EC 81 MG tablet Take 1 tablet (81 mg total) by mouth daily. Swallow whole. 90 tablet 3   Budeson-Glycopyrrol-Formoterol (BREZTRI AEROSPHERE) 160-9-4.8 MCG/ACT AERO Inhale 2 puffs into the lungs in the morning and at bedtime. 1 each 6   citalopram (CELEXA) 20 MG tablet Take 1 tablet (20 mg total) by mouth daily. 90 tablet 0   cloNIDine (CATAPRES) 0.1 MG tablet TAKE 1 IN THE MORNING AND 2 TABLETS AT NIGHT 90 tablet 3   [employer] ALLERGY RELIEF 25 MG tablet Take by mouth.     denosumab (PROLIA) 60 MG/ML SOLN injection Inject 60 mg into the skin every 6 (six) months. Administer in upper arm, thigh, or abdomen     dexlansoprazole (DEXILANT) 60 MG capsule  Take 60 mg by mouth daily.     fluticasone (FLONASE) 50 MCG/ACT nasal spray Place into both nostrils.     ipratropium (ATROVENT) 0.06 % nasal spray Place 2 sprays into both nostrils 4 (four) times daily.     lamoTRIgine (LAMICTAL) 25 MG tablet Take 50 mg by mouth 2 (two) times daily.     Lurasidone HCl 120 MG TABS Take 1 tablet (120 mg total) by mouth daily. 30 tablet 5   omeprazole (PRILOSEC) 40 MG capsule Take 40 mg by mouth daily.     SYNTHROID 88 MCG tablet Take 88 mcg by mouth daily.     topiramate (TOPAMAX) 100 MG tablet TAKE 1 AND 1/2 TABLETS DAILY BY MOUTH 45 tablet 3   Vitamin D, Ergocalciferol, (DRISDOL) 50000 UNITS CAPS  capsule Take 50,000 Units by mouth every 7 (seven) days.     atorvastatin (LIPITOR) 80 MG tablet Take 1 tablet (80 mg total) by mouth daily. 90 tablet 3   diphenhydrAMINE (BENADRYL) 50 MG tablet Take 1 tablet (50 mg total) by mouth once for 1 dose. Pt to take 50 mg of benadryl on 08/03/21 at 10:40 am. Please call [phone] with any questions. 1 tablet 0   TRELEGY ELLIPTA 100-62.5-25 MCG/INH AEPB Inhale 1 puff into the lungs daily. (Patient not taking: Reported on 08/30/2022)     No facility-administered medications prior to visit.     Review of Systems:   Constitutional:   No  weight loss, night sweats,  Fevers, chills,  +fatigue, or  lassitude.  HEENT:   No headaches,  Difficulty swallowing,  Tooth/dental problems, or  Sore throat,                No sneezing, itching, ear ache, nasal congestion, post nasal drip,   CV:  No chest pain,  Orthopnea, PND, swelling in lower extremities, anasarca, dizziness, palpitations, syncope.   GI  No heartburn, indigestion, abdominal pain, nausea, vomiting, diarrhea, change in bowel habits, loss of appetite, bloody stools.   Resp:.  No chest wall deformity  Skin: no rash or lesions.  GU: no dysuria, change in color of urine, no urgency or frequency.  No flank pain, no hematuria   MS:  No joint pain or swelling.  No  decreased range of motion.  No back pain.    Physical Exam  BP 98/60 (BP Location: Left Arm, Patient Position: Sitting, Cuff Size: Normal)   Pulse 74   Temp 97.9 F (36.6 C) (Oral)   Ht '5\' 7"'$  (1.702 m)   Wt 113 lb 6.4 oz (51.4 kg)   SpO2 98%   BMI 17.76 kg/m   GEN: A/Ox3; pleasant , NAD, thin    HEENT:  [place]/AT,  NOSE-clear, THROAT-clear, no lesions, no postnasal drip or exudate noted.   NECK:  Supple w/ fair ROM; no JVD; normal carotid impulses w/o bruits; no thyromegaly or nodules palpated; no lymphadenopathy.    RESP  Clear  P & A; w/o, wheezes/ rales/ or rhonchi. no accessory muscle use, no dullness to percussion  CARD:  RRR, no m/r/g, no peripheral edema, pulses intact, no cyanosis or clubbing.  GI:   Soft & nt; nml bowel sounds; no organomegaly or masses detected.   Musco: Warm bil, no deformities or joint swelling noted.   Neuro: alert, no focal deficits noted.    Skin: Warm, no lesions or rashes    Lab Results:     BNP No results found for: "BNP"  ProBNP No results found for: "PROBNP"  Imaging: No results found.        No data to display          No results found for: "NITRICOXIDE"      Assessment & Plan:   Chronic obstructive pulmonary disease ([place]) COPD prone to recurrent exacerbations.  Improved symptom control on Breztri.  Encouraged on smoking cessation.  Check alpha-1 today.  Plan  Patient Instructions  Continue on Breztri 2 puffs twice daily, rinse after use Albuterol inhaler as needed Lab work today with alpha-1 testing Activity as tolerated High protein diet .  Follow-up with Dr. [place] Bells in 4 months and As needed    '   Tobacco abuse Smoking cessation      Rexene Edison, NP 08/30/2022

## 2022-09-26 DIAGNOSIS — M542 Cervicalgia: Secondary | ICD-10-CM | POA: Diagnosis not present

## 2022-09-26 DIAGNOSIS — M791 Myalgia, unspecified site: Secondary | ICD-10-CM | POA: Diagnosis not present

## 2022-09-26 DIAGNOSIS — G518 Other disorders of facial nerve: Secondary | ICD-10-CM | POA: Diagnosis not present

## 2022-09-26 DIAGNOSIS — R519 Headache, unspecified: Secondary | ICD-10-CM | POA: Diagnosis not present

## 2022-10-03 ENCOUNTER — Encounter: Payer: Self-pay | Admitting: Psychiatry

## 2022-10-03 ENCOUNTER — Ambulatory Visit (INDEPENDENT_AMBULATORY_CARE_PROVIDER_SITE_OTHER): Payer: Medicare HMO | Admitting: Psychiatry

## 2022-10-03 DIAGNOSIS — F4001 Agoraphobia with panic disorder: Secondary | ICD-10-CM | POA: Diagnosis not present

## 2022-10-03 DIAGNOSIS — F4310 Post-traumatic stress disorder, unspecified: Secondary | ICD-10-CM | POA: Diagnosis not present

## 2022-10-03 DIAGNOSIS — F3162 Bipolar disorder, current episode mixed, moderate: Secondary | ICD-10-CM | POA: Diagnosis not present

## 2022-10-03 DIAGNOSIS — F411 Generalized anxiety disorder: Secondary | ICD-10-CM

## 2022-10-03 DIAGNOSIS — R69 Illness, unspecified: Secondary | ICD-10-CM | POA: Diagnosis not present
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
## 2022-10-04 ENCOUNTER — Ambulatory Visit: Payer: Medicare HMO | Attending: Internal Medicine | Admitting: Internal Medicine

## 2022-10-04 ENCOUNTER — Encounter: Payer: Self-pay | Admitting: Internal Medicine

## 2022-10-04 VITALS — BP 100/62 | HR 68 | Ht 67.0 in | Wt 114.2 lb

## 2022-10-04 DIAGNOSIS — E785 Hyperlipidemia, unspecified: Secondary | ICD-10-CM | POA: Diagnosis not present

## 2022-10-04 DIAGNOSIS — Q230 Congenital stenosis of aortic valve: Secondary | ICD-10-CM

## 2022-10-04 DIAGNOSIS — R55 Syncope and collapse: Secondary | ICD-10-CM

## 2022-10-04 DIAGNOSIS — Q231 Congenital insufficiency of aortic valve: Secondary | ICD-10-CM | POA: Diagnosis not present

## 2022-10-04 DIAGNOSIS — Z79899 Other long term (current) drug therapy: Secondary | ICD-10-CM

## 2022-10-04 DIAGNOSIS — I77810 Thoracic aortic ectasia: Secondary | ICD-10-CM

## 2022-10-04 MED ORDER — ATORVASTATIN CALCIUM 80 MG PO TABS: 80.0000 mg | ORAL_TABLET | Freq: Every day | ORAL | 3 refills | Status: AC

## 2022-10-04 NOTE — Patient Instructions (Signed)
Medication Instructions:  No Changes *If you need a refill on your cardiac medications before your next appointment, please call your pharmacy*   Lab Work: None Ordered If you have labs (blood work) drawn today and your tests are completely normal, you will receive your results only by: [place] (if you have MyChart) OR A paper copy in the mail If you have any lab test that is abnormal or we need to change your treatment, we will call you to review the results.   Testing/Procedures: Your physician has requested that you have an echocardiogram. Echocardiography is a painless test that uses sound waves to create images of your heart. It provides your doctor with information about the size and shape of your heart and how well your heart's chambers and valves are working. This procedure takes approximately one hour. There are no restrictions for this procedure. Please do NOT wear cologne, perfume, aftershave, or lotions (deodorant is allowed). Please arrive 15 minutes prior to your appointment time.    Follow-Up: At [facility], you and your health needs are our priority.  As part of our continuing mission to provide you with exceptional heart care, we have created designated Provider Care Teams.  These Care Teams include your primary Cardiologist (physician) and Advanced Practice Providers (APPs -  Physician Assistants and Nurse Practitioners) who all work together to provide you with the care you need, when you need it.    Your next appointment:   1 year(s)  Provider:   Elouise Munroe, MD

## 2022-10-04 NOTE — Progress Notes (Signed)
Cardiology Office Note:    Date:  10/04/2022   ID:  [name], DOB 08-09-1956, MRN [id number]  PCP:  [name], MD  Cardiologist:  Elouise Munroe, MD  Electrophysiologist:  None   Referring MD: [name], MD   Chief Complaint/Reason for Referral: Bicuspid aortic valve stenosis.   History of Present Illness:    [name] is a 67 y.o. female with a history of mild bicuspid aortic valve stenosis, COPD, GERD, hypothyroidism.   At her last visit on 11/29/2020, she presented after a syncopal episode on August 02, 2019.  She reported she was shopping at [facility] and when she went out into the parking lot she had a syncopal episode, prior to she was quite dizzy.  Her evaluation was benign for an etiology for syncope.   She had dyspnea on exertion after which a stress test was performed as was low risk.  She had done really well overall.  We reviewed the results of her last echocardiogram which showed mild bicuspid aortic valve stenosis and borderline ascending aorta dilation.  This had been stable since an echo 2 years ago.  We discussed interval of echocardiography.    Today, she presents overall well. She has had concerns about the severe headaches she has been having for a while. Her father had an aneurysm. She reported an episode where she had a severe headache for about 9 weeks which she feels on her right temple that radiated towards her right eye. She sees Dr. Domingo Cocking to manage her headaches and reports she was told her headaches involved a facial nerve. She denies having had shingles or a viral infection in her eye.   She endorses a bit of shortness of breath that she has been managing with Dr. Lake Bells.   We discussed her needing a repeat echo today to see the progression of her aortic valve. We also discussed her EKG today to her last EKG which showed normal rhythm and pattern septal infarct.  We discussed her recent cholesterol labs. Her LDL was 93. She takes  80 mg of atorvastatin. She has also been making diet changes to decrease her LDL.   She reports she does not get enough circulation in her feet. She has neuropathy. She was meant to have an ultrasound of her feet, but decided against it due to her having an upcoming bunion surgery at the time.   She denies any palpitations, chest pain, or peripheral edema. No lightheadedness, syncope, orthopnea, or PND.  Past Medical History:  Diagnosis Date   Bipolar disorder ([place])    PT ON TOPIRAMATE AND ZANAX- SEES PSYCHIATRIST DR. COTTLE - PT STATES DOING WELL ON MEDICATIONS   COPD (chronic obstructive pulmonary disease) ([place])    OCCAS USE OF INHALER-QUIT SMOKING 2014 - USING E- CIGARETTE   Diverticulitis    GERD (gastroesophageal reflux disease)    H/O suicide attempt 2007   PT'S SON HAD COMMITTED SUICIDE AND SHE WAS OVER COME WITH GRIEF   Hypothyroidism    IBS (irritable bowel syndrome)    Lichen simplex chronicus    MVP (mitral valve prolapse)    DOES NOT CAUSE ANY PROBLEMS   Osteoporosis    Pain    RIGHT SHOULDER AND LIMITED ROM - -RT SHOULDER IMPINGEMENT, ADHESIVE CAPSULITIS, ROTATOR CUFF TEAR   Pain    NECK PAIN -PT HOPING SURGERY ON HER SHOULDER HELPS HER NECK PAIN- DOES HAVE HX OF CERBVICAL DISK FUSION.   Pain    LUMBAR PAIN -  DDD AND PREVIOUS LUMBAR SURGERY   Vitamin D deficiency     Past Surgical History:  Procedure Laterality Date   ABDOMINAL HYSTERECTOMY  1975   BACK SURGERY     LUMBAR SURGERY FOR HNP   BREAST SURGERY     BREAST REDUCTION   CERVICAL DISK FUSION     LEFT OOPHORECTOMY  1994   REDUCTION MAMMAPLASTY     SHOULDER ARTHROSCOPY WITH SUBACROMIAL DECOMPRESSION AND OPEN ROTATOR C Right 02/19/2014   Procedure: RIGHT SHOULDER ARTHROSCOPY SUBACROMIAL DECOMPRESSION EVALUATION UNDER ANESTHESIA AND  MANIPULATION UNDER ANESTHIESIA DEBRIDEMENT OF PARTIAL ROTATOR CUFF;  Surgeon: Johnn Hai, MD;  Location: [facility];  Service: Orthopedics;  Laterality: Right;    Current  Medications: Current Meds  Medication Sig   Acetylcysteine (N-ACETYL-L-CYSTEINE) 600 MG CAPS Take 600 mg by mouth daily.   albuterol (PROVENTIL HFA;VENTOLIN HFA) 108 (90 BASE) MCG/ACT inhaler Inhale 1 puff into the lungs every 6 (six) hours as needed for wheezing or shortness of breath.   ALPRAZolam (XANAX) 0.5 MG tablet TAKE 1 TABLET BY MOUTH IN THE MORNING, AT NOON IN THE EVENING AND AT BEDTIME EVERY DAY   aspirin EC 81 MG tablet Take 1 tablet (81 mg total) by mouth daily. Swallow whole.   Budeson-Glycopyrrol-Formoterol (BREZTRI AEROSPHERE) 160-9-4.8 MCG/ACT AERO Inhale 2 puffs into the lungs in the morning and at bedtime.   citalopram (CELEXA) 20 MG tablet Take 1 tablet (20 mg total) by mouth daily.   cloNIDine (CATAPRES) 0.1 MG tablet TAKE 1 IN THE MORNING AND 2 TABLETS AT NIGHT   [employer] ALLERGY RELIEF 25 MG tablet Take by mouth.   denosumab (PROLIA) 60 MG/ML SOLN injection Inject 60 mg into the skin every 6 (six) months. Administer in upper arm, thigh, or abdomen   dexlansoprazole (DEXILANT) 60 MG capsule Take 60 mg by mouth daily.   fluticasone (FLONASE) 50 MCG/ACT nasal spray Place into both nostrils.   ipratropium (ATROVENT) 0.06 % nasal spray Place 2 sprays into both nostrils 4 (four) times daily.   lamoTRIgine (LAMICTAL) 25 MG tablet Take 50 mg by mouth 2 (two) times daily.   Lurasidone HCl 120 MG TABS Take 1 tablet (120 mg total) by mouth daily.   omeprazole (PRILOSEC) 40 MG capsule Take 40 mg by mouth daily.   SYNTHROID 88 MCG tablet Take 88 mcg by mouth daily.   topiramate (TOPAMAX) 100 MG tablet TAKE 1 AND 1/2 TABLETS DAILY BY MOUTH   Vitamin D, Ergocalciferol, (DRISDOL) 50000 UNITS CAPS capsule Take 50,000 Units by mouth every 7 (seven) days.     Allergies:   Alendronate sodium, Fiorinal [butalbital-aspirin-caffeine], Hydrocodone bit-homatrop mbr, Iodinated contrast media, Iohexol, Klonopin [clonazepam], Levothyroxine, Other, Strawberry (diagnostic), Tramadol,  Tramadol-acetaminophen, and Dexilant [dexlansoprazole]   Social History   Tobacco Use   Smoking status: Every Day    Packs/day: 1.00    Years: 55.00    Total pack years: 55.00    Types: Cigarettes   Smokeless tobacco: Current   Tobacco comments:    Smoking 1/2 ppd.  Trying to cut back.  Trying to find vape flavor she likes.  08/30/2022 hfb  Substance Use Topics   Alcohol use: No   Drug use: No     Family History: The patient's family history includes Heart attack in her paternal grandmother; Heart disease in her father and mother; Heart failure in her mother; Stroke in her mother.  ROS:   Please see the history of present illness.   (+) Headaches (severe, being managed) (+)  Circulatory issues in feet All other systems reviewed and are negative.  EKGs/Labs/Other Studies Reviewed:    The following studies were reviewed today:  EKG: EKG is personally reviewed. 08/05/2023: Sinus rhythm. Rate 68 bpm. Septal infarct pattern 11/29/2020: Sinus bradycardia, septal infarct pattern.  Heart rate 58 beats a minute.  No significant change from prior EKG 04/20/2020.  Echo 09/29/2020: IMPRESSIONS   1. Left ventricular ejection fraction, by estimation, is 60 to 65%. The  left ventricle has normal function. The left ventricle has no regional  wall motion abnormalities. Left ventricular diastolic parameters were  normal.   2. Right ventricular systolic function is normal. The right ventricular  size is normal. There is normal pulmonary artery systolic pressure.   3. The mitral valve is normal in structure. Trivial mitral valve  regurgitation. No MVP visualized.   4. The aortic valve is incompletely visualized but appears bicuspid with  mild thickening and mild calcification. Aortic valve regurgitation is  trivial. Mild aortic valve stenosis.   5. There is mild dilatation of the aortic root, measuring 38 mm.   6. The ascending aorta is normal in size. No coarctation visualized.   7. The  inferior vena cava is normal in size with greater than 50%  respiratory variability, suggesting right atrial pressure of 3 mmHg.   Stress Test 04/27/2020: Nuclear stress EF: 69%. There was no ST segment deviation noted during stress. Defect 1: There is a small defect of severe severity present in the apex location. The study is normal. This is a low risk study. The left ventricular ejection fraction is hyperdynamic (>65%).   Normal stress nuclear study with apical thinning but no ischemia.  Gated ejection fraction 69% with normal wall motion.  Recent Labs: No results found for requested labs within last 365 days.  Recent Lipid Panel No results found for: "CHOL", "TRIG", "HDL", "CHOLHDL", "VLDL", "LDLCALC", "LDLDIRECT"  Physical Exam:    VS:  BP 100/62   Pulse 68   Ht '5\' 7"'$  (1.702 m)   Wt 114 lb 3.2 oz (51.8 kg)   SpO2 98%   BMI 17.89 kg/m     Wt Readings from Last 5 Encounters:  10/04/22 114 lb 3.2 oz (51.8 kg)  08/30/22 113 lb 6.4 oz (51.4 kg)  07/24/22 114 lb 9.6 oz (52 kg)  07/20/21 112 lb 2 oz (50.9 kg)  07/08/21 115 lb (52.2 kg)    Constitutional: No acute distress Eyes: sclera non-icteric, normal conjunctiva and lids ENMT: normal dentition, moist mucous membranes Cardiovascular: regular rhythm, normal rate, faint systolic murmur. S1 and S2 normal. No jugular venous distention.  Respiratory: clear to auscultation bilaterally GI : normal bowel sounds, soft and nontender. No distention.   MSK: extremities warm, well perfused. No edema.  NEURO: grossly nonfocal exam, moves all extremities. PSYCH: alert and oriented x 3, normal mood and affect.   ASSESSMENT:    1. Bicuspid aortic valve   2. Aortic stenosis due to bicuspid aortic valve   3. Ascending aorta dilatation (HCC)   4. Syncope, unspecified syncope type   5. Hyperlipidemia, unspecified hyperlipidemia type   6. Medication management     PLAN:    Bicuspid aortic valve -She has mild bicuspid aortic valve  stenosis both clinically and by echo.  It has been overall stable since an echocardiogram 2 years ago.  We will plan to repeat echo now. First-degree family screening has been reviewed with her in the past.  Ascending aorta dilatation (HCC)-borderline ascending aorta dilation is  stable over serial echoes.  Can monitor with next echo.  Syncope, unspecified syncope type-no recurrence.  Encouraged adequate hydration and close monitoring of lightheadedness.  Hyperlipidemia, unspecified hyperlipidemia type -  -Lipid panel with good control, continue atorvastatin 80 mg daily.  Medication management-continues on ASA 81 mg daily.  Total time of encounter: 30 minutes total time of encounter, including 20 minutes spent in face-to-face patient care on the date of this encounter. This time includes coordination of care and counseling regarding above mentioned problem list. Remainder of non-face-to-face time involved reviewing chart documents/testing relevant to the patient encounter and documentation in the medical record.  I have independently reviewed her echocardiogram images from 09/2020.  Cherlynn Kaiser, MD, [facility]    Medication Adjustments/Labs and Tests Ordered: Current medicines are reviewed at length with the patient today.  Concerns regarding medicines are outlined above.   Orders Placed This Encounter  Procedures   EKG 12-Lead   ECHOCARDIOGRAM COMPLETE   Meds ordered this encounter  Medications   atorvastatin (LIPITOR) 80 MG tablet    Sig: Take 1 tablet (80 mg total) by mouth daily.    Dispense:  90 tablet    Refill:  3   Patient Instructions  Medication Instructions:  No Changes *If you need a refill on your cardiac medications before your next appointment, please call your pharmacy*   Lab Work: None Ordered If you have labs (blood work) drawn today and your tests are completely normal, you will receive your results only by: [facility] (if you  have MyChart) OR A paper copy in the mail If you have any lab test that is abnormal or we need to change your treatment, we will call you to review the results.   Testing/Procedures: Your physician has requested that you have an echocardiogram. Echocardiography is a painless test that uses sound waves to create images of your heart. It provides your doctor with information about the size and shape of your heart and how well your heart's chambers and valves are working. This procedure takes approximately one hour. There are no restrictions for this procedure. Please do NOT wear cologne, perfume, aftershave, or lotions (deodorant is allowed). Please arrive 15 minutes prior to your appointment time.    Follow-Up: At [facility], you and your health needs are our priority.  As part of our continuing mission to provide you with exceptional heart care, we have created designated Provider Care Teams.  These Care Teams include your primary Cardiologist (physician) and Advanced Practice Providers (APPs -  Physician Assistants and Nurse Practitioners) who all work together to provide you with the care you need, when you need it.    Your next appointment:   1 year(s)  Provider:   Elouise Munroe, MD      I,Rachel Rivera,acting as a scribe for Elouise Munroe, MD.,have documented all relevant documentation on the behalf of Elouise Munroe, MD,as directed by  Elouise Munroe, MD while in the presence of Elouise Munroe, MD.  I, Elouise Munroe, MD, have reviewed all documentation for this visit. The documentation on 10/04/22 for the exam, diagnosis, procedures, and orders are all accurate and complete.

## 2022-10-05 ENCOUNTER — Telehealth: Payer: Self-pay | Admitting: Adult Health

## 2022-10-05 ENCOUNTER — Other Ambulatory Visit: Payer: Self-pay | Admitting: Psychiatry

## 2022-10-05 DIAGNOSIS — F4001 Agoraphobia with panic disorder: Secondary | ICD-10-CM

## 2022-10-05 DIAGNOSIS — J449 Chronic obstructive pulmonary disease, unspecified: Secondary | ICD-10-CM | POA: Diagnosis not present

## 2022-10-05 DIAGNOSIS — F3181 Bipolar II disorder: Secondary | ICD-10-CM

## 2022-10-05 DIAGNOSIS — R69 Illness, unspecified: Secondary | ICD-10-CM | POA: Diagnosis not present

## 2022-10-05 DIAGNOSIS — E785 Hyperlipidemia, unspecified: Secondary | ICD-10-CM | POA: Diagnosis not present

## 2022-10-05 NOTE — Telephone Encounter (Signed)
Tammy, please advise on the medication that pt was prescribed for Thrush to see if this will help treat it or if you think something else needs to be prescribed. You can also refer to other encounter from 1/18 for more information.

## 2022-10-05 NOTE — Telephone Encounter (Signed)
Called and spoke with pt who states she went to the dentist and was prescribed Rx for thrush.  Pt said that she had not picked Rx up from pharmacy yet so did not know what the name of the med was. Did provide pt the names of the two meds that we usually prescribe for thrush and stated to her once she got the Rx that was prescribed for her by the dentist and if it wasn't one of the two meds that we usually prescribe for her to call us back and we could then check with provider to see if they thought what she had been prescribed would help treat the thrush or if they thought she needed something else and she verbalized understanding. Nothing further needed at this time.

## 2022-10-05 NOTE — Telephone Encounter (Signed)
Mycelex troche five times daily for 1 week for thrush #35 .  Rinse well after inhaler

## 2022-10-06 MED ORDER — CLOTRIMAZOLE 10 MG MT TROC: 10.0000 mg | Freq: Every day | OROMUCOSAL | 0 refills | Status: AC

## 2022-10-06 NOTE — Telephone Encounter (Signed)
Called and spoke with pt letting her know recs per TP and she verbalized understanding. Medication has been sent to preferred pharmacy for pt. Nothing further needed.

## 2022-10-25 ENCOUNTER — Ambulatory Visit (HOSPITAL_COMMUNITY)
Admission: RE | Admit: 2022-10-25 | Discharge: 2022-10-25 | Disposition: A | Discharge status: Home / Self Care | Payer: Medicare HMO | Source: Ambulatory Visit | Attending: Internal Medicine | Admitting: Internal Medicine

## 2022-10-25 ENCOUNTER — Other Ambulatory Visit (HOSPITAL_COMMUNITY): Payer: Self-pay | Admitting: Physician Assistant

## 2022-10-25 DIAGNOSIS — Q231 Congenital insufficiency of aortic valve: Secondary | ICD-10-CM | POA: Diagnosis not present

## 2022-10-25 DIAGNOSIS — Q230 Congenital stenosis of aortic valve: Secondary | ICD-10-CM

## 2022-10-25 DIAGNOSIS — K219 Gastro-esophageal reflux disease without esophagitis: Secondary | ICD-10-CM | POA: Insufficient documentation

## 2022-10-25 DIAGNOSIS — R1310 Dysphagia, unspecified: Secondary | ICD-10-CM | POA: Diagnosis not present

## 2022-10-25 DIAGNOSIS — J449 Chronic obstructive pulmonary disease, unspecified: Secondary | ICD-10-CM | POA: Diagnosis not present

## 2022-10-25 DIAGNOSIS — K224 Dyskinesia of esophagus: Secondary | ICD-10-CM | POA: Diagnosis not present

## 2022-10-25 NOTE — Progress Notes (Signed)
Echocardiogram 2D Echocardiogram has been performed.  [name] 10/25/2022, 3:03 PM

## 2022-10-26 LAB — ECHOCARDIOGRAM COMPLETE
AR max vel: 0.85 cm2
AV Area VTI: 0.78 cm2
AV Area mean vel: 0.76 cm2
AV Mean grad: 19.5 mmHg
AV Peak grad: 31.4 mmHg
Ao pk vel: 2.8 m/s
Area-P 1/2: 2.39 cm2
Calc EF: 59.2 %
S' Lateral: 2.6 cm
Single Plane A2C EF: 58.5 %
Single Plane A4C EF: 55.3 %

## 2022-10-27 ENCOUNTER — Ambulatory Visit (HOSPITAL_BASED_OUTPATIENT_CLINIC_OR_DEPARTMENT_OTHER)
Admission: RE | Admit: 2022-10-27 | Discharge: 2022-10-27 | Disposition: A | Discharge status: Home / Self Care | Payer: Medicare HMO | Source: Ambulatory Visit | Attending: Pulmonary Disease | Admitting: Pulmonary Disease

## 2022-10-27 DIAGNOSIS — J432 Centrilobular emphysema: Secondary | ICD-10-CM | POA: Diagnosis not present

## 2022-10-27 DIAGNOSIS — R911 Solitary pulmonary nodule: Secondary | ICD-10-CM | POA: Diagnosis not present

## 2022-11-06 ENCOUNTER — Ambulatory Visit (HOSPITAL_COMMUNITY)
Admission: RE | Admit: 2022-11-06 | Discharge: 2022-11-06 | Disposition: A | Discharge status: Home / Self Care | Payer: Medicare HMO | Source: Ambulatory Visit | Attending: Physician Assistant | Admitting: Physician Assistant

## 2022-11-06 DIAGNOSIS — R1310 Dysphagia, unspecified: Secondary | ICD-10-CM

## 2022-11-07 DIAGNOSIS — M791 Myalgia, unspecified site: Secondary | ICD-10-CM | POA: Diagnosis not present

## 2022-11-07 DIAGNOSIS — G518 Other disorders of facial nerve: Secondary | ICD-10-CM | POA: Diagnosis not present

## 2022-11-07 DIAGNOSIS — R519 Headache, unspecified: Secondary | ICD-10-CM | POA: Diagnosis not present

## 2022-11-07 DIAGNOSIS — M542 Cervicalgia: Secondary | ICD-10-CM | POA: Diagnosis not present

## 2022-11-17 ENCOUNTER — Other Ambulatory Visit: Payer: Medicare HMO

## 2022-11-23 ENCOUNTER — Other Ambulatory Visit: Payer: Self-pay | Admitting: Psychiatry

## 2022-11-23 DIAGNOSIS — F4001 Agoraphobia with panic disorder: Secondary | ICD-10-CM

## 2022-11-23 DIAGNOSIS — F411 Generalized anxiety disorder: Secondary | ICD-10-CM

## 2022-11-23 DIAGNOSIS — F4310 Post-traumatic stress disorder, unspecified: Secondary | ICD-10-CM

## 2022-12-08 DIAGNOSIS — E441 Mild protein-calorie malnutrition: Secondary | ICD-10-CM | POA: Diagnosis not present

## 2022-12-08 DIAGNOSIS — E039 Hypothyroidism, unspecified: Secondary | ICD-10-CM | POA: Diagnosis not present

## 2022-12-08 DIAGNOSIS — R69 Illness, unspecified: Secondary | ICD-10-CM | POA: Diagnosis not present

## 2022-12-08 DIAGNOSIS — E785 Hyperlipidemia, unspecified: Secondary | ICD-10-CM | POA: Diagnosis not present

## 2022-12-08 DIAGNOSIS — R8290 Unspecified abnormal findings in urine: Secondary | ICD-10-CM | POA: Diagnosis not present

## 2022-12-08 DIAGNOSIS — K219 Gastro-esophageal reflux disease without esophagitis: Secondary | ICD-10-CM | POA: Diagnosis not present

## 2022-12-08 DIAGNOSIS — J449 Chronic obstructive pulmonary disease, unspecified: Secondary | ICD-10-CM | POA: Diagnosis not present

## 2022-12-14 ENCOUNTER — Ambulatory Visit
Admission: RE | Admit: 2022-12-14 | Discharge: 2022-12-14 | Disposition: A | Discharge status: Home / Self Care | Payer: Medicare HMO | Source: Ambulatory Visit | Attending: Family Medicine | Admitting: Family Medicine

## 2022-12-14 ENCOUNTER — Other Ambulatory Visit: Payer: Self-pay | Admitting: *Deleted

## 2022-12-14 DIAGNOSIS — F17210 Nicotine dependence, cigarettes, uncomplicated: Secondary | ICD-10-CM

## 2022-12-14 DIAGNOSIS — M810 Age-related osteoporosis without current pathological fracture: Secondary | ICD-10-CM | POA: Diagnosis not present

## 2022-12-14 DIAGNOSIS — Z87891 Personal history of nicotine dependence: Secondary | ICD-10-CM

## 2022-12-14 DIAGNOSIS — Z122 Encounter for screening for malignant neoplasm of respiratory organs: Secondary | ICD-10-CM

## 2022-12-14 DIAGNOSIS — Z780 Asymptomatic menopausal state: Secondary | ICD-10-CM | POA: Diagnosis not present

## 2022-12-14 DIAGNOSIS — M8588 Other specified disorders of bone density and structure, other site: Secondary | ICD-10-CM | POA: Diagnosis not present

## 2022-12-18 DIAGNOSIS — D2239 Melanocytic nevi of other parts of face: Secondary | ICD-10-CM | POA: Diagnosis not present

## 2022-12-18 DIAGNOSIS — B001 Herpesviral vesicular dermatitis: Secondary | ICD-10-CM | POA: Diagnosis not present

## 2022-12-18 DIAGNOSIS — L814 Other melanin hyperpigmentation: Secondary | ICD-10-CM | POA: Diagnosis not present

## 2022-12-28 DIAGNOSIS — M791 Myalgia, unspecified site: Secondary | ICD-10-CM | POA: Diagnosis not present

## 2022-12-28 DIAGNOSIS — G518 Other disorders of facial nerve: Secondary | ICD-10-CM | POA: Diagnosis not present

## 2022-12-28 DIAGNOSIS — R519 Headache, unspecified: Secondary | ICD-10-CM | POA: Diagnosis not present

## 2022-12-28 DIAGNOSIS — M542 Cervicalgia: Secondary | ICD-10-CM | POA: Diagnosis not present

## 2023-01-02 ENCOUNTER — Encounter: Payer: Self-pay | Admitting: Psychiatry

## 2023-01-02 ENCOUNTER — Ambulatory Visit (INDEPENDENT_AMBULATORY_CARE_PROVIDER_SITE_OTHER): Payer: Medicare HMO | Admitting: Psychiatry

## 2023-01-02 DIAGNOSIS — F3181 Bipolar II disorder: Secondary | ICD-10-CM | POA: Diagnosis not present

## 2023-01-02 DIAGNOSIS — F411 Generalized anxiety disorder: Secondary | ICD-10-CM

## 2023-01-02 DIAGNOSIS — F3162 Bipolar disorder, current episode mixed, moderate: Secondary | ICD-10-CM | POA: Diagnosis not present

## 2023-01-02 DIAGNOSIS — F4310 Post-traumatic stress disorder, unspecified: Secondary | ICD-10-CM | POA: Diagnosis not present

## 2023-01-02 DIAGNOSIS — R69 Illness, unspecified: Secondary | ICD-10-CM | POA: Diagnosis not present

## 2023-01-02 DIAGNOSIS — F4001 Agoraphobia with panic disorder: Secondary | ICD-10-CM

## 2023-01-02 MED ORDER — TOPIRAMATE 100 MG PO TABS: ORAL_TABLET | ORAL | 5 refills | Status: DC

## 2023-01-02 MED ORDER — CITALOPRAM HYDROBROMIDE 20 MG PO TABS: 20.0000 mg | ORAL_TABLET | Freq: Every day | ORAL | 1 refills | Status: DC

## 2023-01-02 MED ORDER — ALPRAZOLAM 0.5 MG PO TABS: ORAL_TABLET | ORAL | 5 refills | Status: DC

## 2023-01-02 MED ORDER — LURASIDONE HCL 120 MG PO TABS: 120.0000 mg | ORAL_TABLET | Freq: Every day | ORAL | 5 refills | Status: DC

## 2023-01-02 MED ORDER — CLONIDINE HCL 0.1 MG PO TABS: ORAL_TABLET | ORAL | 3 refills | Status: DC
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
## 2023-01-06 IMAGING — CT CT HEAD W/O CM
4 series · 17 of 47 positions shown, 19 images · non-contrast
Comparison: CT head 12/15/2005.  MRI 09/14/2014.

CLINICAL DATA: Headache, new or worsening. Headache for 4 weeks in
the upper right frontal region.

EXAM:
CT HEAD WITHOUT CONTRAST
TECHNIQUE: Contiguous axial images were obtained from the base of the skull
through the vertex without intravenous contrast.

[Series 2: head wo · axial · 0.43mm/px · z∈[+889,+1004]mm · 7 of 31 slices shown, 9 images]
[im 4/31  brain]
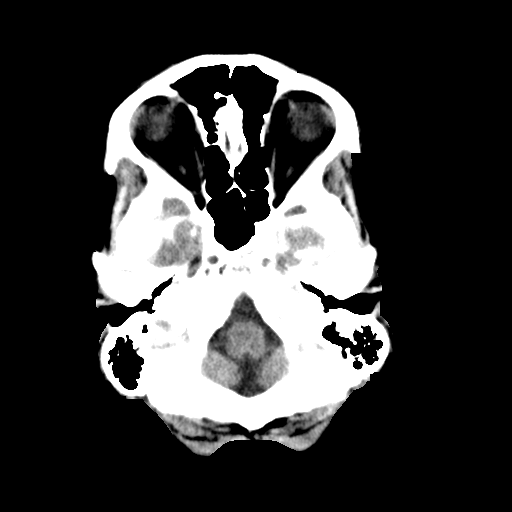
[im 4/31  bone]
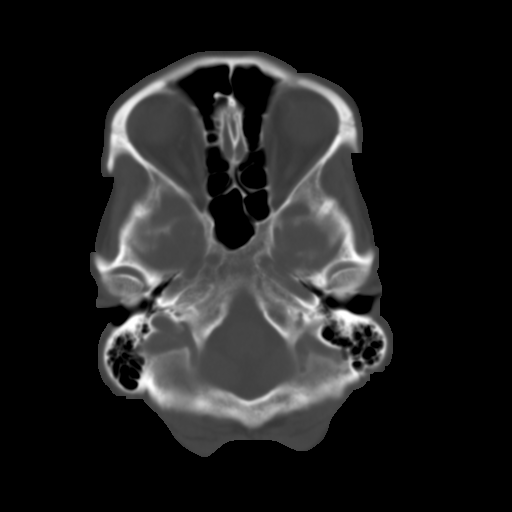
[im 8/31  brain]
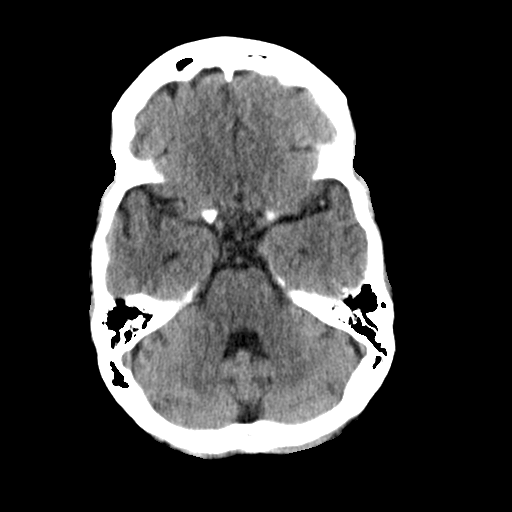
[im 12/31  brain]
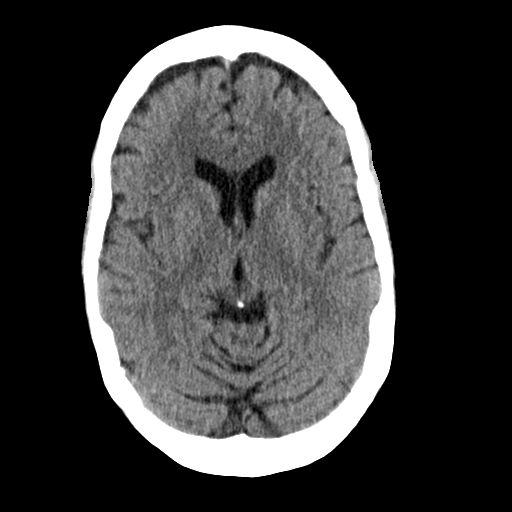
[im 16/31  brain]
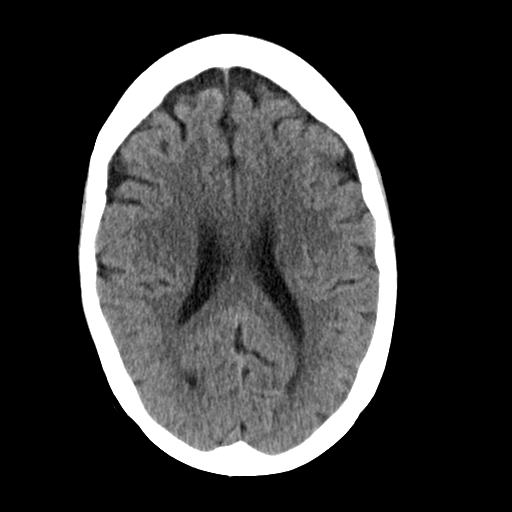
[im 19/31  brain]
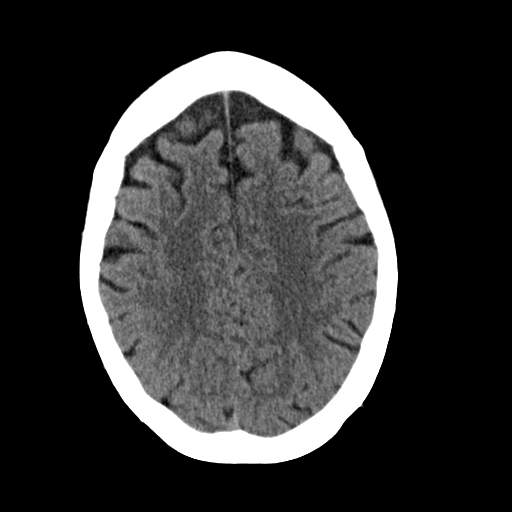
[im 19/31  bone]
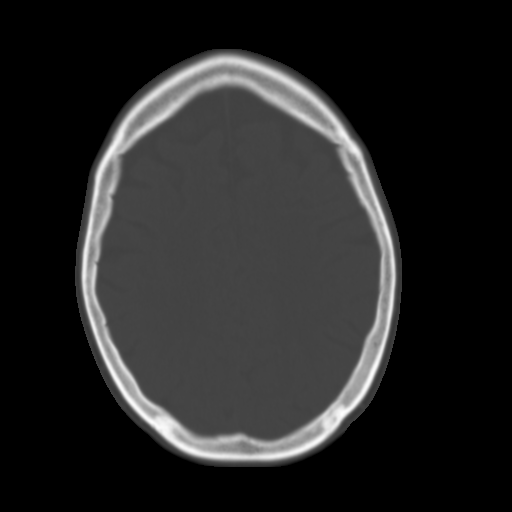
[im 23/31  brain]
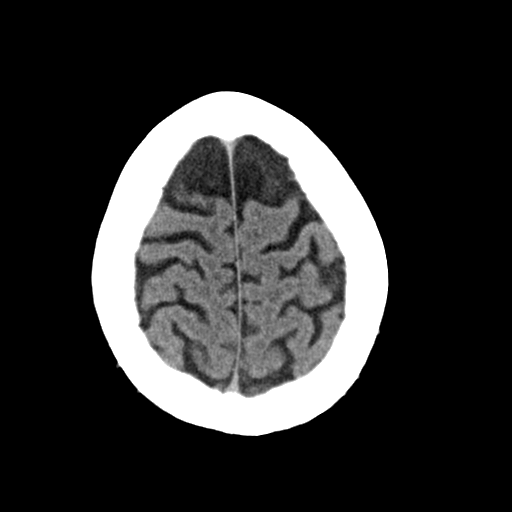
[im 27/31  brain]
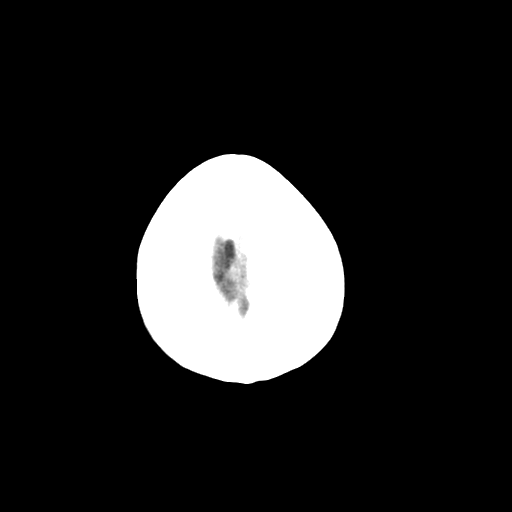

[Series 3: head bone · axial · 0.43mm/px · z∈[+888,+942]mm · 4 of 78 slices shown]
[im 8/78  bone]
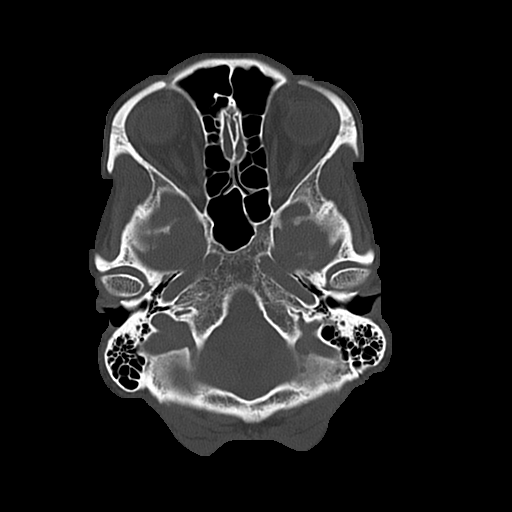
[im 16/78  bone]
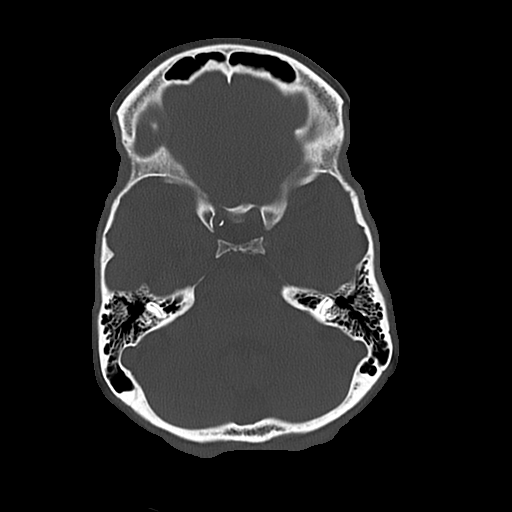
[im 24/78  bone]
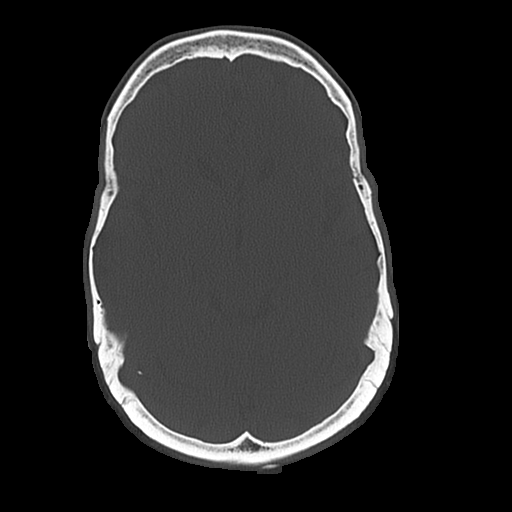
[im 35/78  bone]
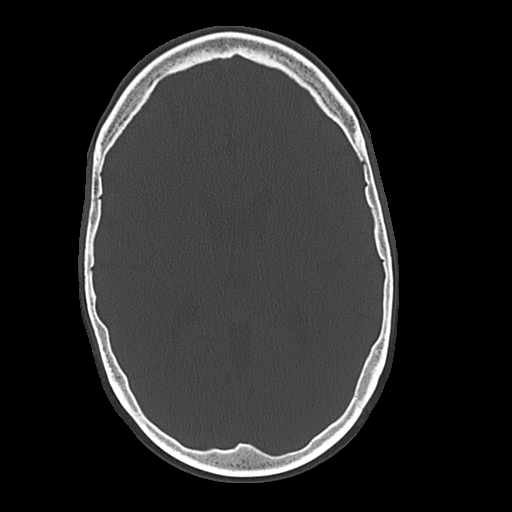

[Series 4: coronal soft · coronal · 0.36mm/px · 3 of 70 slices shown]
[im 24/70  brain]
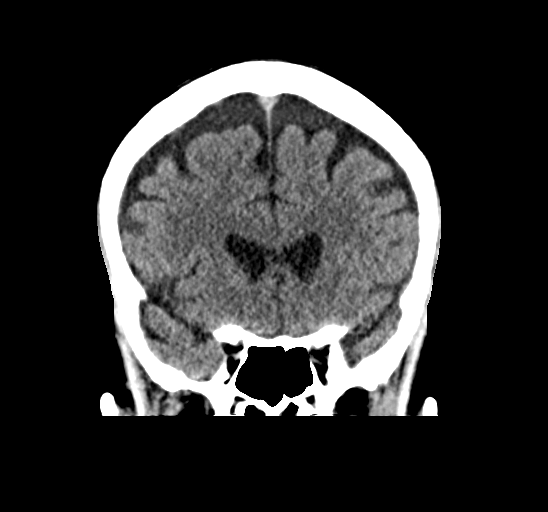
[im 31/70  brain]
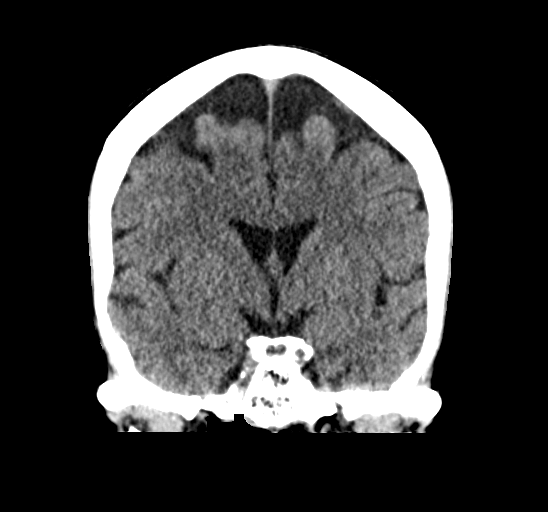
[im 39/70  brain]
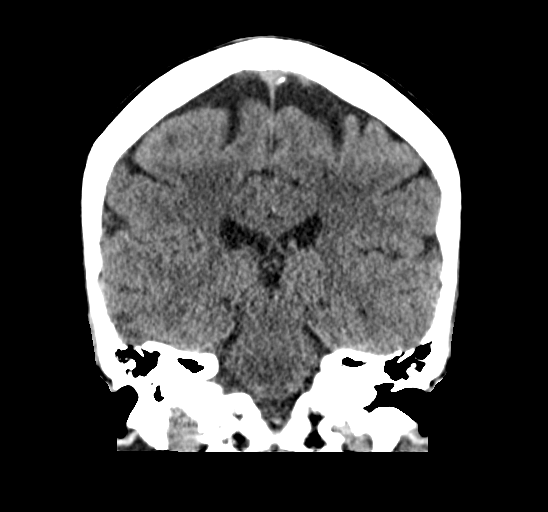

[Series 5: sagittal soft · sagittal · 0.36mm/px · 3 of 49 slices shown]
[im 17/49  brain]
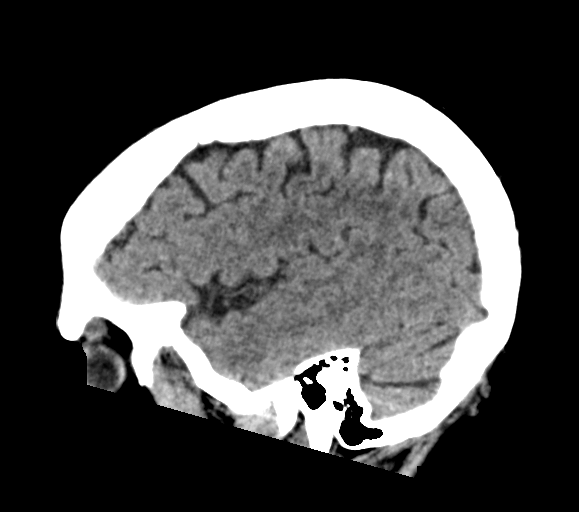
[im 25/49  brain]
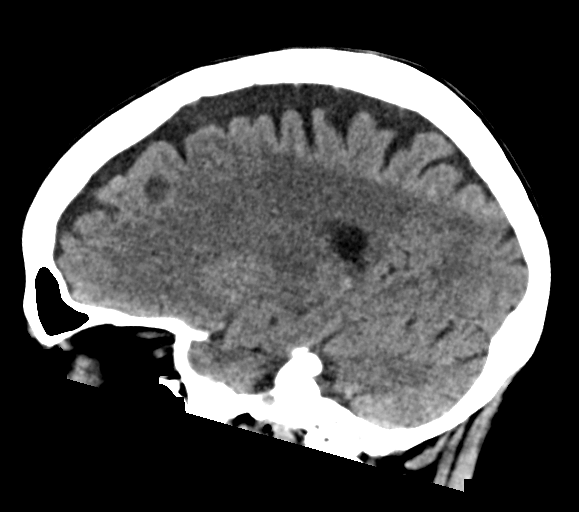
[im 33/49  brain]
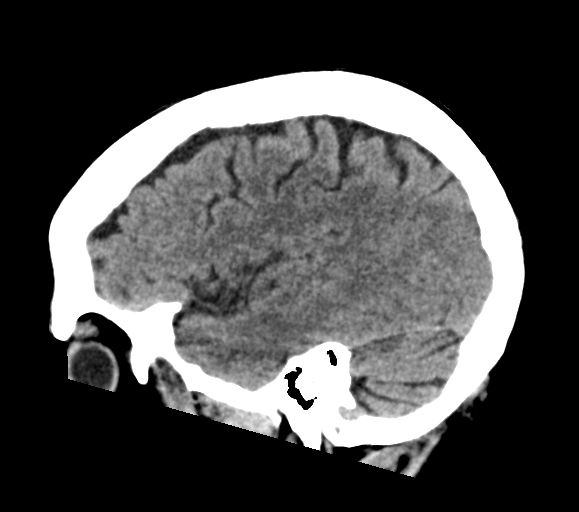

[17 of 47 positions shown; findings below may reference images not displayed]

FINDINGS: Brain: There is no evidence of acute intracranial hemorrhage, mass
lesion, brain edema or extra-axial fluid collection. Mild atrophy
with mild prominence of the ventricles and subarachnoid spaces.
There is no CT evidence of acute cortical infarction.

Vascular: Intracranial vascular calcifications. No hyperdense vessel
identified.

Skull: Negative for fracture or focal lesion.

Sinuses/Orbits: The visualized paranasal sinuses and mastoid air
cells are clear. No orbital abnormalities are seen.

Other: None.
IMPRESSION: No acute or significant intracranial findings. Mild cerebral
atrophy.

## 2023-01-10 DIAGNOSIS — M25561 Pain in right knee: Secondary | ICD-10-CM | POA: Diagnosis not present

## 2023-01-11 DIAGNOSIS — F3170 Bipolar disorder, currently in remission, most recent episode unspecified: Secondary | ICD-10-CM | POA: Diagnosis not present

## 2023-01-11 DIAGNOSIS — E785 Hyperlipidemia, unspecified: Secondary | ICD-10-CM | POA: Diagnosis not present

## 2023-01-11 DIAGNOSIS — J449 Chronic obstructive pulmonary disease, unspecified: Secondary | ICD-10-CM | POA: Diagnosis not present

## 2023-01-13 DIAGNOSIS — M25561 Pain in right knee: Secondary | ICD-10-CM | POA: Diagnosis not present

## 2023-01-18 DIAGNOSIS — M810 Age-related osteoporosis without current pathological fracture: Secondary | ICD-10-CM | POA: Diagnosis not present

## 2023-01-29 DIAGNOSIS — M25561 Pain in right knee: Secondary | ICD-10-CM | POA: Diagnosis not present

## 2023-01-30 ENCOUNTER — Ambulatory Visit: Payer: Medicare HMO | Admitting: Adult Health

## 2023-02-13 ENCOUNTER — Other Ambulatory Visit: Payer: Self-pay

## 2023-02-13 DIAGNOSIS — Q230 Congenital stenosis of aortic valve: Secondary | ICD-10-CM

## 2023-02-16 ENCOUNTER — Telehealth: Payer: Self-pay | Admitting: *Deleted

## 2023-02-16 ENCOUNTER — Telehealth: Payer: Self-pay | Admitting: Pulmonary Disease

## 2023-02-16 NOTE — Telephone Encounter (Signed)
Pt states 3 weeks ago Dr. Jillyn Hidden sent over a Surgical Clearance form for a upcoming surgery. They are going to resend it today. PT would like a call to advise what action taken. Thanks.

## 2023-02-16 NOTE — Telephone Encounter (Signed)
   Pre-operative Risk Assessment    Patient Name: OUITA IMANI  DOB: January 01, 1956 MRN: [id number]      Request for Surgical Clearance    Procedure:   RIGHT KNEE SCOPE, PARTIAL MEDIAL MENISCECTOMY AND DEB, POSSIBLE MICROFRACTURE  Date of Surgery:  Clearance TBD                                 Surgeon:  DR. Jene Every Surgeon's Group or Practice Name:  [facility] Phone number:  [phone] ATTN: Rosalva Ferron Fax number:  [phone]   Type of Clearance Requested:   - Medical ; ASA    Type of Anesthesia:   CHOICE   Additional requests/questions:    [name]   02/16/2023, 3:45 PM

## 2023-02-16 NOTE — Telephone Encounter (Signed)
Left message to call back to set up tele pre op appt.  

## 2023-02-16 NOTE — Telephone Encounter (Signed)
   Name: [name]  DOB: [birth year]  MRN: [id number]  Primary Cardiologist: Parke Poisson, MD   Preoperative team, please contact this patient and set up a phone call appointment for further preoperative risk assessment. Please obtain consent and complete medication review. Thank you for your help.  I confirm that guidance regarding antiplatelet and oral anticoagulation therapy has been completed and, if necessary, noted below.  Her aspirin may be held for 5-7 days prior to her procedure.  Please resume as soon as hemostasis is achieved.   Ronney Asters, NP 02/16/2023, 4:09 PM [facility]

## 2023-02-19 ENCOUNTER — Telehealth: Payer: Self-pay | Admitting: Nurse Practitioner

## 2023-02-19 ENCOUNTER — Telehealth: Payer: Self-pay

## 2023-02-19 NOTE — Telephone Encounter (Signed)
Fax received from Dr. Jene Every with [facility] to perform a Right Knee Scope, Partial Medial Meniscectomy and Deb on patient.  Patient needs surgery clearance. Surgery is Pending. Patient was seen on 08/30/2022. Office protocol is a risk assessment can be sent to surgeon if patient has been seen in 60 days or less.   Sending to [facility]  for risk assessment or recommendations if patient needs to be seen in office prior to surgical procedure.    [name] has been scheduled for 02/26/23. You had first available and patient is hoping to have surgery soon.

## 2023-02-19 NOTE — Telephone Encounter (Signed)
Patient checking on surgical clearance. Patient phone number is [phone].

## 2023-02-19 NOTE — Telephone Encounter (Signed)
  Patient Consent for Virtual Visit         [name] has provided verbal consent on 02/19/2023 for a virtual visit (video or telephone).   CONSENT FOR VIRTUAL VISIT FOR:  [name]  By participating in this virtual visit I agree to the following:  I hereby voluntarily request, consent and authorize [facility] and its employed or contracted physicians, physician assistants, nurse practitioners or other licensed health care professionals (the Practitioner), to provide me with telemedicine health care services (the "Services") as deemed necessary by the treating Practitioner. I acknowledge and consent to receive the Services by the Practitioner via telemedicine. I understand that the telemedicine visit will involve communicating with the Practitioner through live audiovisual communication technology and the disclosure of certain medical information by electronic transmission. I acknowledge that I have been given the opportunity to request an in-person assessment or other available alternative prior to the telemedicine visit and am voluntarily participating in the telemedicine visit.  I understand that I have the right to withhold or withdraw my consent to the use of telemedicine in the course of my care at any time, without affecting my right to future care or treatment, and that the Practitioner or I may terminate the telemedicine visit at any time. I understand that I have the right to inspect all information obtained and/or recorded in the course of the telemedicine visit and may receive copies of available information for a reasonable fee.  I understand that some of the potential risks of receiving the Services via telemedicine include:  Delay or interruption in medical evaluation due to technological equipment failure or disruption; Information transmitted may not be sufficient (e.g. poor resolution of images) to allow for appropriate medical decision making by the  Practitioner; and/or  In rare instances, security protocols could fail, causing a breach of personal health information.  Furthermore, I acknowledge that it is my responsibility to provide information about my medical history, conditions and care that is complete and accurate to the best of my ability. I acknowledge that Practitioner's advice, recommendations, and/or decision may be based on factors not within their control, such as incomplete or inaccurate data provided by me or distortions of diagnostic images or specimens that may result from electronic transmissions. I understand that the practice of medicine is not an exact science and that Practitioner makes no warranties or guarantees regarding treatment outcomes. I acknowledge that a copy of this consent can be made available to me via my patient portal [facility] MyChart), or I can request a printed copy by calling the office of [facility].    I understand that my insurance will be billed for this visit.   I have read or had this consent read to me. I understand the contents of this consent, which adequately explains the benefits and risks of the Services being provided via telemedicine.  I have been provided ample opportunity to ask questions regarding this consent and the Services and have had my questions answered to my satisfaction. I give my informed consent for the services to be provided through the use of telemedicine in my medical care  \

## 2023-02-19 NOTE — Telephone Encounter (Signed)
Patient is returning call and is requesting call back.  

## 2023-02-19 NOTE — Telephone Encounter (Signed)
Patient scheduled for tele health visit on 6/12. Med rec and consent done

## 2023-02-20 NOTE — Telephone Encounter (Signed)
Closing encounter since multiple open

## 2023-02-26 ENCOUNTER — Other Ambulatory Visit: Payer: Medicare HMO

## 2023-02-26 ENCOUNTER — Ambulatory Visit (INDEPENDENT_AMBULATORY_CARE_PROVIDER_SITE_OTHER): Payer: Medicare HMO

## 2023-02-26 ENCOUNTER — Ambulatory Visit: Payer: Medicare HMO | Admitting: Nurse Practitioner

## 2023-02-26 ENCOUNTER — Encounter: Payer: Self-pay | Admitting: Nurse Practitioner

## 2023-02-26 VITALS — BP 108/70 | HR 68 | Temp 97.9°F | Ht 67.0 in | Wt 115.8 lb

## 2023-02-26 DIAGNOSIS — Z01811 Encounter for preprocedural respiratory examination: Secondary | ICD-10-CM | POA: Diagnosis not present

## 2023-02-26 DIAGNOSIS — J449 Chronic obstructive pulmonary disease, unspecified: Secondary | ICD-10-CM

## 2023-02-26 DIAGNOSIS — Z720 Tobacco use: Secondary | ICD-10-CM

## 2023-02-26 DIAGNOSIS — Z01818 Encounter for other preprocedural examination: Secondary | ICD-10-CM | POA: Diagnosis not present

## 2023-02-26 MED ORDER — TRELEGY ELLIPTA 100-62.5-25 MCG/ACT IN AEPB: 1.0000 | INHALATION_SPRAY | Freq: Every day | RESPIRATORY_TRACT | 5 refills | Status: DC

## 2023-02-26 NOTE — Patient Instructions (Addendum)
Stop Breztri. Start Trelegy 1 puff daily. Brush tongue and rinse mouth afterwards Continue Albuterol inhaler 2 puffs every 6 hours as needed for shortness of breath or wheezing. Notify if symptoms persist despite rescue inhaler/neb use.   Chest x ray today  Labs today at [facility]  I hope your surgery goes well. Use incentive spirometer 10 times an hour while awake during recovery. Out of bed as soon as possible. Use your inhaler the morning of surgery and take your rescue with you   Follow up in 4 months with any new pulmonologist. If symptoms do not improve or worsen, please contact office for sooner follow up or seek emergency care.

## 2023-02-26 NOTE — Progress Notes (Signed)
@Patient  ID: [name], female    DOB: 1956/02/04, 67 y.o.   MRN: [id number]  Chief Complaint  Patient presents with   Follow-up    Surgical clearance,  pt would like to change inhalers     Referring provider: Laurann Montana, MD  HPI: 67 year old female, active smoker followed for COPD. She is a former patient of Dr. Kendrick Fries and last seen in office 08/30/2022 by Parrett,NP. Past medical history significant for allergic rhinitis, GERD, hypothyroid, bipolar. PTSD, HLD.  Followed by lung cancer screening program.  TEST/EVENTS:  07/24/2022 spirometry: FVC 104, FEV1 92, ratio 67 10/27/2022 CT chest without contrast: Atherosclerosis.  Centrilobular emphysema with biapical pleural-parenchymal scarring.  Previously seen 4.6 mm right lower lobe nodule has nearly resolved.  Minimal subsegmental volume loss in the lower lobes.  Debris's in the airways.  08/30/2022: OV with Parrett NP.  1 month follow-up.  Last seen for pulmonary consult.  Participates in lung cancer screening program.  CT chest August 2023 showed moderate to severe emphysema and a 4.6 mm right upper lobe nodule.  Friend recurrent COPD flareups.  Recommended on smoking cessation.  Changed from Trelegy to [facility] inhaler at previous visit.  Spirometry showed mild airflow obstruction.  Feels like [name] is working better.  Feels like it is helping.  Under a lot of stress -husband has cancer, son died.  Trying to work on cutting back smoking.  Was sick last week.  Symptoms are improved.  Cough and congestion have decreased.  Feeling better.  Alpha-1 testing ordered  02/26/2023: Today - surgical clearance  Patient presents today for surgical clearance.  She is anticipated to have right knee surgery with Dr. Jillyn Hidden to repair a torn meniscus and ligament tear.  Surgery date is to be determined. Breathing doing well. Can complete all activities without trouble. Occasional cough; dry cough.  No recent exacerbations requiring antibiotics or  prednisone.  No hospitalizations for her breathing.  She is having trouble with recurrent thrush with the [facility].  Has been treated by her PCP a few times for this.  She admits to cleaning her mouth out after use.  Typically rinses and then brushes her teeth and tongue.  She would like to go back to the Trelegy.  Did not have any trouble when she was on this in the past.  She is still smoking.  Allergies  Allergen Reactions   Alendronate Sodium Shortness Of Breath   Fiorinal [Butalbital-Aspirin-Caffeine]     SWELLING OF LIPS AND HIVES   Hydrocodone Bit-Homatrop Mbr     Other reaction(s): nausea   Iodinated Contrast Media     Other reaction(s): hives   Iohexol      Code: HIVES, Desc: omnipaque 300-doc'd by KB on 04-13-06.Marland KitchenMarland Kitchenpt states she gets severe hives    Klonopin [Clonazepam]     PT STATES SHE OVERDOSED ON KLONOPIN   Levothyroxine     Other reaction(s): hives with the generic   Other     THE PURPLE DYE IN GENERIC SYNTHROID ( LEVOTHYROXINE ) CAUSED BREAKING OUT FACE AND SCALP.  PT CAN TAKE SYNTHROID.   Strawberry (Diagnostic)     Other reaction(s): canker sores in mouth   Tramadol Other (See Comments)    depression   Tramadol-Acetaminophen     Other reaction(s): depressed   Dexilant [Dexlansoprazole] Diarrhea    Immunization History  Administered Date(s) Administered   DTaP / Hep B / IPV 06/04/2017, 12/03/2017, 06/06/2018, 12/06/2018, 06/09/2019, 12/08/2019, 06/10/2020, 12/09/2020, 06/15/2021   Fluad  Quad(high Dose 65+) 07/31/2022   Influenza Inj Mdck Quad Pf 05/29/2018   Influenza Split 07/07/2008, 06/30/2012, 07/04/2017   Influenza, High Dose Seasonal PF 05/30/2021   Influenza,inj,Quad PF,6+ Mos 06/15/2016, 05/29/2018, 05/20/2019   PFIZER(Purple Top)SARS-COV-2 Vaccination 12/27/2019, 01/20/2020, 08/29/2020   Pneumococcal Conjugate-13 05/27/2020   Pneumococcal Polysaccharide-23 08/30/2012, 05/30/2021   Respiratory Syncytial Virus Vaccine,Recomb Aduvanted(Arexvy) 08/18/2022    Tdap 08/30/2012   Zoster, Live 03/15/2016    Past Medical History:  Diagnosis Date   Bipolar disorder (HCC)    PT ON TOPIRAMATE AND ZANAX- SEES PSYCHIATRIST DR. COTTLE - PT STATES DOING WELL ON MEDICATIONS   COPD (chronic obstructive pulmonary disease) (HCC)    OCCAS USE OF INHALER-QUIT SMOKING 2014 - USING E- CIGARETTE   Diverticulitis    GERD (gastroesophageal reflux disease)    H/O suicide attempt 2007   PT'S SON HAD COMMITTED SUICIDE AND SHE WAS OVER COME WITH GRIEF   Hypothyroidism    IBS (irritable bowel syndrome)    Lichen simplex chronicus    MVP (mitral valve prolapse)    DOES NOT CAUSE ANY PROBLEMS   Osteoporosis    Pain    RIGHT SHOULDER AND LIMITED ROM - -RT SHOULDER IMPINGEMENT, ADHESIVE CAPSULITIS, ROTATOR CUFF TEAR   Pain    NECK PAIN -PT HOPING SURGERY ON HER SHOULDER HELPS HER NECK PAIN- DOES HAVE HX OF CERBVICAL DISK FUSION.   Pain    LUMBAR PAIN - DDD AND PREVIOUS LUMBAR SURGERY   Vitamin D deficiency     Tobacco History: Social History   Tobacco Use  Smoking Status Every Day   Packs/day: 1.00   Years: 55.00   Additional pack years: 0.00   Total pack years: 55.00   Types: Cigarettes  Smokeless Tobacco Current  Tobacco Comments   Smoking 1/2 ppd.  Trying to cut back.  Trying to find vape flavor she likes.  08/30/2022 hfb   Ready to quit: Not Answered Counseling given: Not Answered Tobacco comments: Smoking 1/2 ppd.  Trying to cut back.  Trying to find vape flavor she likes.  08/30/2022 hfb   Outpatient Medications Prior to Visit  Medication Sig Dispense Refill   Acetylcysteine (N-ACETYL-L-CYSTEINE) 600 MG CAPS Take 600 mg by mouth daily.     albuterol (PROVENTIL HFA;VENTOLIN HFA) 108 (90 BASE) MCG/ACT inhaler Inhale 1 puff into the lungs every 6 (six) hours as needed for wheezing or shortness of breath.     ALPRAZolam (XANAX) 0.5 MG tablet TAKE 1 TABLET BY MOUTH IN THE MORNING, AT NOON IN THE EVENING AND AT BEDTIME 120 tablet 5   aspirin EC 81  MG tablet Take 1 tablet (81 mg total) by mouth daily. Swallow whole. 90 tablet 3   atorvastatin (LIPITOR) 80 MG tablet Take 1 tablet (80 mg total) by mouth daily. 90 tablet 3   citalopram (CELEXA) 20 MG tablet Take 1 tablet (20 mg total) by mouth daily. 90 tablet 1   cloNIDine (CATAPRES) 0.1 MG tablet TAKE 1 IN THE MORNING AND 2 TABLETS AT NIGHT 90 tablet 3   clotrimazole (MYCELEX) 10 MG troche Take 1 tablet (10 mg total) by mouth 5 (five) times daily. 35 tablet 0   [employer] ALLERGY RELIEF 25 MG tablet Take by mouth.     denosumab (PROLIA) 60 MG/ML SOLN injection Inject 60 mg into the skin every 6 (six) months. Administer in upper arm, thigh, or abdomen     dexlansoprazole (DEXILANT) 60 MG capsule Take 60 mg by mouth daily.  fluticasone (FLONASE) 50 MCG/ACT nasal spray Place into both nostrils.     ipratropium (ATROVENT) 0.06 % nasal spray Place 2 sprays into both nostrils 4 (four) times daily.     lamoTRIgine (LAMICTAL) 25 MG tablet Take 50 mg by mouth 2 (two) times daily.     Lurasidone HCl 120 MG TABS Take 1 tablet (120 mg total) by mouth daily. 30 tablet 5   omeprazole (PRILOSEC) 40 MG capsule Take 40 mg by mouth daily.     SYNTHROID 88 MCG tablet Take 88 mcg by mouth daily.     topiramate (TOPAMAX) 100 MG tablet TAKE 1 AND 1/2 TABLETS DAILY BY MOUTH 45 tablet 5   Vitamin D, Ergocalciferol, (DRISDOL) 50000 UNITS CAPS capsule Take 50,000 Units by mouth every 7 (seven) days.     Budeson-Glycopyrrol-Formoterol (BREZTRI AEROSPHERE) 160-9-4.8 MCG/ACT AERO Inhale 2 puffs into the lungs in the morning and at bedtime. 1 each 6   diphenhydrAMINE (BENADRYL) 50 MG tablet Take 1 tablet (50 mg total) by mouth once for 1 dose. Pt to take 50 mg of benadryl on 08/03/21 at 10:40 am. Please call [phone] with any questions. 1 tablet 0   No facility-administered medications prior to visit.     Review of Systems:   Constitutional: No weight loss or gain, night sweats, fevers, chills, fatigue, or  lassitude. HEENT: No headaches, difficulty swallowing, tooth/dental problems, or sore throat. No sneezing, itching, ear ache, nasal congestion, or post nasal drip CV:  No chest pain, orthopnea, PND, swelling in lower extremities, anasarca, dizziness, palpitations, syncope Resp: +shortness of breath with exertion (baseline); occasional dry cough. No excess mucus or change in color of mucus. No hemoptysis. No wheezing.  No chest wall deformity GI:  No heartburn, indigestion Skin: No rash, lesions, ulcerations MSK:  +right knee pain. No decreased range of motion.   Neuro: No dizziness or lightheadedness.  Psych: No depression or anxiety. Mood stable.     Physical Exam:  BP 108/70   Pulse 68   Temp 97.9 F (36.6 C)   Ht 5\' 7"  (1.702 m)   Wt 115 lb 12.8 oz (52.5 kg)   SpO2 99%   BMI 18.14 kg/m   GEN: Pleasant, interactive, well-kempt; in no acute distress. HEENT:  Normocephalic and atraumatic. PERRLA. Sclera white. Nasal turbinates pink, moist and patent bilaterally. No rhinorrhea present. Oropharynx pink and moist, without exudate or edema. No lesions, ulcerations, or postnasal drip.  NECK:  Supple w/ fair ROM. No JVD present. Normal carotid impulses w/o bruits. Thyroid symmetrical with no goiter or nodules palpated. No lymphadenopathy.   CV: RRR, no m/r/g, no peripheral edema. Pulses intact, +2 bilaterally. No cyanosis, pallor or clubbing. PULMONARY:  Unlabored, regular breathing. Clear bilaterally A&P w/o wheezes/rales/rhonchi. No accessory muscle use.  GI: BS present and normoactive. Soft, non-tender to palpation. No organomegaly or masses detected.  MSK: No erythema, warmth or tenderness. Cap refil <2 sec all extrem. No deformities or joint swelling noted. Muscle wasting Neuro: A/Ox3. No focal deficits noted.   Skin: Warm, no lesions or rashe Psych: Normal affect and behavior. Judgement and thought content appropriate.     Lab Results:  CBC    Component Value Date/Time   WBC  7.7 07/08/2021 1705   RBC 4.20 07/08/2021 1705   HGB 13.0 07/08/2021 1705   HCT 38.9 07/08/2021 1705   PLT 234 07/08/2021 1705   MCV 92.6 07/08/2021 1705   MCH 31.0 07/08/2021 1705   MCHC 33.4 07/08/2021 1705   RDW  13.7 07/08/2021 1705   LYMPHSABS 2.5 07/08/2021 1705   MONOABS 0.7 07/08/2021 1705   EOSABS 0.1 07/08/2021 1705   BASOSABS 0.1 07/08/2021 1705    BMET    Component Value Date/Time   NA 136 07/08/2021 1705   NA 136 10/14/2019 1042   K 4.0 07/08/2021 1705   CL 107 07/08/2021 1705   CO2 23 07/08/2021 1705   GLUCOSE 102 (H) 07/08/2021 1705   BUN 10 07/08/2021 1705   BUN 11 10/14/2019 1042   CREATININE 0.70 07/08/2021 1705   CALCIUM 8.8 (L) 07/08/2021 1705   GFRNONAA >60 07/08/2021 1705   GFRAA 94 10/14/2019 1042    BNP No results found for: "BNP"   Imaging:  No results found.        No data to display          No results found for: "NITRICOXIDE"      Assessment & Plan:   Chronic obstructive pulmonary disease (HCC) Mild obstruction. Prone to recurrent exacerbations but has been stable for the past 6+ months. Having difficulties with recurrent thrush with Breztri. We discussed potential options including adding a spacer or possibly changing her to lower dose Symbicort and add Spiriva. She would like to go back on Trelegy. We did discuss the risk of thrush with any ICS, which she understands. Oral hygiene reviewed. Medication education provided. She will call if symptoms recur. Action plan in place. Smoking cessation advised.   Patient Instructions  Stop Breztri. Start Trelegy 1 puff daily. Brush tongue and rinse mouth afterwards Continue Albuterol inhaler 2 puffs every 6 hours as needed for shortness of breath or wheezing. Notify if symptoms persist despite rescue inhaler/neb use.   Chest x ray today  Labs today at [facility]  I hope your surgery goes well. Use incentive spirometer 10 times an hour while awake during recovery. Out of bed as soon as  possible. Use your inhaler the morning of surgery and take your rescue with you   Follow up in 4 months with any new pulmonologist. If symptoms do not improve or worsen, please contact office for sooner follow up or seek emergency care.    Preoperative respiratory examination Moderate risk. Factors that increase the risk for postoperative pulmonary complications are COPD, age, malnutrition, tobacco use.   Respiratory complications generally occur in 1% of ASA Class I patients, 5% of ASA Class II and 10% of ASA Class III-IV patients These complications rarely result in mortality and include postoperative pneumonia, atelectasis, pulmonary embolism, ARDS and increased time requiring postoperative mechanical ventilation.   Overall, I recommend proceeding with the surgery if the risk for respiratory complications are outweighed by the potential benefits. This will need to be discussed between the patient and surgeon.   To reduce risks of respiratory complications, I recommend: --Pre- and post-operative incentive spirometry performed frequently while awake --Inpatient use of currently prescribed bronchodilators --Short duration of surgery as much as possible and avoid paralytic if possible --OOB, encourage mobility post-op, DVT prophylaxis if indicated   1) RISK FOR PROLONGED MECHANICAL VENTILAION - > 48h  1A) Arozullah - Prolonged mech ventilation risk Arozullah Postperative Pulmonary Risk Score - for mech ventilation dependence >48h USAA, Ann Surg 2000, major non-cardiac surgery) Comment Score  Type of surgery - abd ao aneurysm (27), thoracic (21), neurosurgery / upper abdominal / vascular (21), neck (11) R knee arthroscopy (?) 5  Emergency Surgery - (11)  0  ALbumin < 3 or poor nutritional state - (9) Mild malnutrition  4  BUN > 30 -  (8)  0  Partial or completely dependent functional status - (7)  0  COPD -  (6) COPD 6  Age - 60 to 69 (4), > 70  (6) 67 4  TOTAL  19  Risk  Stratifcation scores  - < 10 (0.5%), 11-19 (1.8%), 20-27 (4.2%), 28-40 (10.1%), >40 (26.6%)  1.8%     Tobacco abuse Smoking cessation advised. CT chest from February 2024 with resolved nodule. No other suspicious nodules/masses. She will continue to follow with the lung cancer screening program - next CT February 2025.   I spent 35 minutes of dedicated to the care of this patient on the date of this encounter to include pre-visit review of records, face-to-face time with the patient discussing conditions above, post visit ordering of testing, clinical documentation with the electronic health record, making appropriate referrals as documented, and communicating necessary findings to members of the patients care team.  Noemi Chapel, NP 02/27/2023  Pt aware and understands NP's role.

## 2023-02-27 ENCOUNTER — Encounter: Payer: Self-pay | Admitting: Nurse Practitioner

## 2023-02-27 DIAGNOSIS — Z01811 Encounter for preprocedural respiratory examination: Secondary | ICD-10-CM | POA: Insufficient documentation

## 2023-02-27 NOTE — Assessment & Plan Note (Signed)
Moderate risk. Factors that increase the risk for postoperative pulmonary complications are COPD, age, malnutrition, tobacco use.   Respiratory complications generally occur in 1% of ASA Class I patients, 5% of ASA Class II and 10% of ASA Class III-IV patients These complications rarely result in mortality and include postoperative pneumonia, atelectasis, pulmonary embolism, ARDS and increased time requiring postoperative mechanical ventilation.   Overall, I recommend proceeding with the surgery if the risk for respiratory complications are outweighed by the potential benefits. This will need to be discussed between the patient and surgeon.   To reduce risks of respiratory complications, I recommend: --Pre- and post-operative incentive spirometry performed frequently while awake --Inpatient use of currently prescribed bronchodilators --Short duration of surgery as much as possible and avoid paralytic if possible --OOB, encourage mobility post-op, DVT prophylaxis if indicated   1) RISK FOR PROLONGED MECHANICAL VENTILAION - > 48h  1A) Arozullah - Prolonged mech ventilation risk Arozullah Postperative Pulmonary Risk Score - for mech ventilation dependence >48h USAA, Ann Surg 2000, major non-cardiac surgery) Comment Score  Type of surgery - abd ao aneurysm (27), thoracic (21), neurosurgery / upper abdominal / vascular (21), neck (11) R knee arthroscopy (?) 5  Emergency Surgery - (11)  0  ALbumin < 3 or poor nutritional state - (9) Mild malnutrition  4  BUN > 30 -  (8)  0  Partial or completely dependent functional status - (7)  0  COPD -  (6) COPD 6  Age - 60 to 69 (4), > 70  (6) 67 4  TOTAL  19  Risk Stratifcation scores  - < 10 (0.5%), 11-19 (1.8%), 20-27 (4.2%), 28-40 (10.1%), >40 (26.6%)  1.8%

## 2023-02-27 NOTE — Progress Notes (Unsigned)
Virtual Visit via Telephone Note   Because of Lynn [name] co-morbid illnesses, she is at least at moderate risk for complications without adequate follow up.  This format is felt to be most appropriate for this patient at this time.  The patient did not have access to video technology/had technical difficulties with video requiring transitioning to audio format only (telephone).  All issues noted in this document were discussed and addressed.  No physical exam could be performed with this format.  Please refer to the patient's chart for her consent to telehealth for [facility].  Evaluation Performed:  Preoperative cardiovascular risk assessment _____________   Date:  02/27/2023   Patient ID:  Lynn [name], DOB 09-Oct-1955, MRN [id number] Patient Location:  Home Provider location:   Office  Primary Care Provider:  Laurann Montana, MD Primary Cardiologist:  Parke Poisson, MD  Chief Complaint / Patient Profile   67 y.o. y/o female with a h/o bicuspid aortic valve stenosis, HLD, COPD, GERD, hypothyroidism, borderline ascending aortic dilatation who is pending right knee scope, partial medial meniscectomy and DEB, possible microfracture and presents today for telephonic preoperative cardiovascular risk assessment.  History of Present Illness    Lynn [name] is a 67 y.o. female who presents via audio/video conferencing for a telehealth visit today.  Pt was last seen in cardiology clinic on 10/04/22 by Dr. Jacques Navy.  At that time Lynn [name] was doing well with echo 10/25/22 that revealed moderate aortic stenosis.  The patient is now pending procedure as outlined above. Since her last visit, she  denies chest pain, shortness of breath, lower extremity edema, fatigue, palpitations, melena, hematuria, hemoptysis, diaphoresis, weakness, presyncope, syncope, orthopnea, and PND. She walks for exercise and does yard and house work without concerning cardiac symptoms.   Past  Medical History    Past Medical History:  Diagnosis Date   Bipolar disorder (HCC)    PT ON TOPIRAMATE AND ZANAX- SEES PSYCHIATRIST DR. COTTLE - PT STATES DOING WELL ON [name]   COPD (chronic obstructive pulmonary disease) (HCC)    OCCAS USE OF INHALER-QUIT SMOKING 2014 - USING E- CIGARETTE   Diverticulitis    GERD (gastroesophageal reflux disease)    H/O suicide attempt 2007   PT'S SON HAD COMMITTED SUICIDE AND SHE WAS OVER COME WITH GRIEF   Hypothyroidism    IBS (irritable bowel syndrome)    Lichen simplex chronicus    MVP (mitral valve prolapse)    DOES NOT CAUSE ANY PROBLEMS   Osteoporosis    Pain    RIGHT SHOULDER AND LIMITED ROM - -RT SHOULDER IMPINGEMENT, ADHESIVE CAPSULITIS, ROTATOR CUFF TEAR   Pain    NECK PAIN -PT HOPING SURGERY ON HER SHOULDER HELPS HER NECK PAIN- DOES HAVE HX OF CERBVICAL DISK FUSION.   Pain    LUMBAR PAIN - DDD AND PREVIOUS LUMBAR SURGERY   Vitamin D deficiency    Past Surgical History:  Procedure Laterality Date   ABDOMINAL HYSTERECTOMY  1975   BACK SURGERY     LUMBAR SURGERY FOR HNP   BREAST SURGERY     BREAST REDUCTION   CERVICAL DISK FUSION     LEFT OOPHORECTOMY  1994   REDUCTION MAMMAPLASTY     SHOULDER ARTHROSCOPY WITH SUBACROMIAL DECOMPRESSION AND OPEN ROTATOR C Right 02/19/2014   Procedure: RIGHT SHOULDER ARTHROSCOPY SUBACROMIAL DECOMPRESSION EVALUATION UNDER ANESTHESIA AND  MANIPULATION UNDER ANESTHIESIA DEBRIDEMENT OF PARTIAL ROTATOR CUFF;  Surgeon: Javier Docker, MD;  Location: [facility];  Service: Orthopedics;  Laterality: Right;    Allergies  Allergies  Allergen Reactions   Alendronate Sodium Shortness Of Breath   Fiorinal [Butalbital-Aspirin-Caffeine]     SWELLING OF LIPS AND HIVES   Hydrocodone Bit-Homatrop Mbr     Other reaction(s): nausea   Iodinated Contrast Media     Other reaction(s): hives   Iohexol      Code: HIVES, Desc: omnipaque 300-doc'd by KB on 04-13-06.Marland KitchenMarland Kitchenpt states she gets severe hives    Klonopin  [Clonazepam]     PT STATES SHE OVERDOSED ON KLONOPIN   Levothyroxine     Other reaction(s): hives with the generic   Other     THE PURPLE DYE IN GENERIC SYNTHROID ( LEVOTHYROXINE ) CAUSED BREAKING OUT FACE AND SCALP.  PT CAN TAKE SYNTHROID.   Strawberry (Diagnostic)     Other reaction(s): canker sores in mouth   Tramadol Other (See Comments)    depression   Tramadol-Acetaminophen     Other reaction(s): depressed   Dexilant [Dexlansoprazole] Diarrhea    Home [name]    Prior to Admission [name]   Medication Sig Start Date End Date Taking? Authorizing Provider  Acetylcysteine (N-ACETYL-L-CYSTEINE) 600 MG CAPS Take 600 mg by mouth daily.    [provider]  albuterol (PROVENTIL HFA;VENTOLIN HFA) 108 (90 BASE) MCG/ACT inhaler Inhale 1 puff into the lungs every 6 (six) hours as needed for wheezing or shortness of breath.    [provider]  ALPRAZolam Prudy Feeler) 0.5 MG tablet TAKE 1 TABLET BY MOUTH IN THE MORNING, AT NOON IN THE EVENING AND AT BEDTIME 01/02/23   Lynn, Steva Ready., MD  aspirin EC 81 MG tablet Take 1 tablet (81 mg total) by mouth daily. Swallow whole. 04/20/20   Parke Poisson, MD  atorvastatin (LIPITOR) 80 MG tablet Take 1 tablet (80 mg total) by mouth daily. 10/04/22 09/29/23  Parke Poisson, MD  citalopram (CELEXA) 20 MG tablet Take 1 tablet (20 mg total) by mouth daily. 01/02/23   Lynn, Steva Ready., MD  cloNIDine (CATAPRES) 0.1 MG tablet TAKE 1 IN THE MORNING AND 2 TABLETS AT NIGHT 01/02/23   Lynn, Steva Ready., MD  clotrimazole (MYCELEX) 10 MG troche Take 1 tablet (10 mg total) by mouth 5 (five) times daily. 10/06/22   Parrett, Virgel Bouquet, NP  [facility] ALLERGY RELIEF 25 MG tablet Take by mouth. 07/28/21   [provider]  denosumab (PROLIA) 60 MG/ML SOLN injection Inject 60 mg into the skin every 6 (six) months. Administer in upper arm, thigh, or abdomen    [provider]  dexlansoprazole (DEXILANT) 60 MG capsule Take 60 mg by mouth  daily.    [provider]  diphenhydrAMINE (BENADRYL) 50 MG tablet Take 1 tablet (50 mg total) by mouth once for 1 dose. Pt to take 50 mg of benadryl on 08/03/21 at 10:40 am. Please call [phone] with any questions. 07/28/21 08/02/21  Obie Dredge, MD  fluticasone (FLONASE) 50 MCG/ACT nasal spray Place into both nostrils. 09/04/21   [provider]  Fluticasone-Umeclidin-Vilant (TRELEGY ELLIPTA) 100-62.5-25 MCG/ACT AEPB Inhale 1 puff into the lungs daily. 02/26/23   Cobb, Ruby Cola, NP  ipratropium (ATROVENT) 0.06 % nasal spray Place 2 sprays into both nostrils 4 (four) times daily. 03/27/22   [provider]  lamoTRIgine (LAMICTAL) 25 MG tablet Take 50 mg by mouth 2 (two) times daily. 09/07/21   [provider]  Lurasidone HCl 120 MG TABS Take 1 tablet (120 mg total)  by mouth daily. 01/02/23   Lynn, Steva Ready., MD  omeprazole (PRILOSEC) 40 MG capsule Take 40 mg by mouth daily.    [provider]  SYNTHROID 88 MCG tablet Take 88 mcg by mouth daily. 06/04/20   [provider]  topiramate (TOPAMAX) 100 MG tablet TAKE 1 AND 1/2 TABLETS DAILY BY MOUTH 01/02/23   Lynn, Steva Ready., MD  Vitamin D, Ergocalciferol, (DRISDOL) 50000 UNITS CAPS capsule Take 50,000 Units by mouth every 7 (seven) days.    [provider]    Physical Exam    Vital Signs:  Lynn [name] does not have vital signs available for review today.  Given telephonic nature of communication, physical exam is limited. AAOx3. NAD. Normal affect.  Speech and respirations are unlabored.  Accessory Clinical Findings    None  Assessment & Plan    1.  Preoperative Cardiovascular Risk Assessment: According to the Revised Cardiac Risk Index (RCRI), her Perioperative Risk of Major Cardiac Event is (%): 0.4. Her Functional Capacity in METs is: 7.59 according to the Duke Activity Status Index (DASI). The patient is doing well from a cardiac perspective. Therefore, based  on ACC/AHA guidelines, the patient would be at acceptable risk for the planned procedure without further cardiovascular testing.   The patient was advised that if she develops new symptoms prior to surgery to contact our office to arrange for a follow-up visit, and she verbalized understanding.  Per office protocol, he may hold aspirin for 7 days prior to procedure and should resume as soon as hemodynamically stable postoperatively.   A copy of this note will be routed to requesting surgeon.  Time:   Today, I have spent 10 minutes with the patient with telehealth technology discussing medical history, symptoms, and management plan.    Levi Aland, NP-C  02/28/2023, 9:21 AM [address] Office [phone] Fax [phone]

## 2023-02-27 NOTE — Assessment & Plan Note (Signed)
Smoking cessation advised. CT chest from February 2024 with resolved nodule. No other suspicious nodules/masses. She will continue to follow with the lung cancer screening program - next CT February 2025.

## 2023-02-27 NOTE — Telephone Encounter (Signed)
OV notes and clearance form have been faxed back to [facility]. Nothing further needed at this time. ?

## 2023-02-27 NOTE — Assessment & Plan Note (Addendum)
Mild obstruction. Prone to recurrent exacerbations but has been stable for the past 6+ months. Having difficulties with recurrent thrush with Breztri. We discussed potential options including adding a spacer or possibly changing her to lower dose Symbicort and add Spiriva. She would like to go back on Trelegy. We did discuss the risk of thrush with any ICS, which she understands. Oral hygiene reviewed. Medication education provided. She will call if symptoms recur. Action plan in place. Smoking cessation advised.   Patient Instructions  Stop Breztri. Start Trelegy 1 puff daily. Brush tongue and rinse mouth afterwards Continue Albuterol inhaler 2 puffs every 6 hours as needed for shortness of breath or wheezing. Notify if symptoms persist despite rescue inhaler/neb use.   Chest x ray today  Labs today at [facility]  I hope your surgery goes well. Use incentive spirometer 10 times an hour while awake during recovery. Out of bed as soon as possible. Use your inhaler the morning of surgery and take your rescue with you   Follow up in 4 months with any new pulmonologist. If symptoms do not improve or worsen, please contact office for sooner follow up or seek emergency care.

## 2023-02-28 ENCOUNTER — Ambulatory Visit: Payer: Medicare HMO | Attending: Cardiology | Admitting: Nurse Practitioner

## 2023-02-28 ENCOUNTER — Encounter: Payer: Self-pay | Admitting: Nurse Practitioner

## 2023-02-28 DIAGNOSIS — Z01810 Encounter for preprocedural cardiovascular examination: Secondary | ICD-10-CM | POA: Diagnosis not present

## 2023-03-05 DIAGNOSIS — R519 Headache, unspecified: Secondary | ICD-10-CM | POA: Diagnosis not present

## 2023-03-05 DIAGNOSIS — G518 Other disorders of facial nerve: Secondary | ICD-10-CM | POA: Diagnosis not present

## 2023-03-05 DIAGNOSIS — M542 Cervicalgia: Secondary | ICD-10-CM | POA: Diagnosis not present

## 2023-03-05 DIAGNOSIS — M791 Myalgia, unspecified site: Secondary | ICD-10-CM | POA: Diagnosis not present

## 2023-03-05 LAB — ALPHA-1 ANTITRYPSIN PHENOTYPE: A-1 Antitrypsin, Ser: 75 mg/dL — ABNORMAL LOW (ref 83–199)

## 2023-03-06 ENCOUNTER — Other Ambulatory Visit: Payer: Self-pay | Admitting: Nurse Practitioner

## 2023-03-06 DIAGNOSIS — J432 Centrilobular emphysema: Secondary | ICD-10-CM

## 2023-03-08 ENCOUNTER — Telehealth: Payer: Self-pay | Admitting: Internal Medicine

## 2023-03-08 ENCOUNTER — Telehealth: Payer: Self-pay | Admitting: Nurse Practitioner

## 2023-03-08 NOTE — Telephone Encounter (Signed)
Pt stated she's supposed to have a procedure done with [facility] but she'd not pleased with the communication in their office. She stated they claim to not have been receiving any of the forms faxed over to them from any of the offices she's had to get clearance from and she's very frustrated about it. She plans to go to another office and would like to know if she can have a letter or proof of clearance so that she can take it to the new office she plans to be seen at. She's requesting a callback to discuss further.

## 2023-03-08 NOTE — Telephone Encounter (Signed)
Patient would like the office visit for surgical clearance with Rhunette Croft NP. Patient phone number is [phone].

## 2023-03-09 NOTE — Telephone Encounter (Signed)
I s/w the pt and she tells me that we cleared her on 02/28/23 and Dr. Ermelinda Das surgery scheduler still has not scheduled the pt for her surgery. Pt said surgery scheduler told her that they did not receive our clearance notes yet. I assured the pt that the clearance notes from Eligha Bridegroom, NP were faxed on 02/28/23 to Dr. Ermelinda Das office providing clearance recommendations for holding  ASA x 7 days prior to surgery.   Pt said she is going to probably switch surgeon's office and go to another practice. Pt states she is going to give Dr. Ermelinda Das surgery scheduler today to call her with surgery date and if not then will change practices. I asked the pt would she like for me to re-fax the notes from our office; pt answered no. She tells me that we did our job, they are not doing their job over at Dr. Ermelinda Das office.   Pt said if she changes practices she will let us know. I stated legally if she changes surgeon's offices we will need a request from the new surgeon practice. I assured the pt that I did not see that it would be a problem with updating clearance notes if need be as we just cleared her, though for Dr. Shelle Iron. Pt verbalized understanding to our conversation and thanked me for the help that I have given her today.   We will wait to see if we receive a new request coming over from Dr. Luiz Blare office [facility].

## 2023-03-13 NOTE — Telephone Encounter (Signed)
Surgical clearance has been completed and sent back to surgeons office. Closing encounter. NFN

## 2023-03-16 ENCOUNTER — Ambulatory Visit: Payer: Self-pay | Admitting: Orthopedic Surgery

## 2023-03-26 NOTE — Patient Instructions (Signed)
DUE TO COVID-19 ONLY TWO VISITORS  (aged 68 and older)  ARE ALLOWED TO COME WITH YOU AND STAY IN THE WAITING ROOM ONLY DURING PRE OP AND PROCEDURE.   **NO VISITORS ARE ALLOWED IN THE SHORT STAY AREA OR RECOVERY ROOM!!**  IF YOU WILL BE ADMITTED INTO THE HOSPITAL YOU ARE ALLOWED ONLY FOUR SUPPORT PEOPLE DURING VISITATION HOURS ONLY (7 AM -8PM)   The support person(s) must pass our screening, gel in and out, and wear a mask at all times, including in the patient's room. Patients must also wear a mask when staff or their support person are in the room. Visitors GUEST BADGE MUST BE WORN VISIBLY  One adult visitor may remain with you overnight and MUST be in the room by 8 P.M.     Your procedure is scheduled on: 04/04/23   Report to [facility] Main Entrance    Report to admitting at : 9:15 AM   Call this number if you have problems the morning of surgery [phone]   Do not eat food :After Midnight.   After Midnight you may have the following liquids until : 8:30 AM DAY OF SURGERY  Water Black Coffee (sugar ok, NO MILK/CREAM OR CREAMERS)  Tea (sugar ok, NO MILK/CREAM OR CREAMERS) regular and decaf                             Plain Jell-O (NO RED)                                           Fruit ices (not with fruit pulp, NO RED)                                     Popsicles (NO RED)                                                                  Juice: apple, WHITE grape, WHITE cranberry Sports drinks like Gatorade (NO RED)   The day of surgery:  Drink ONE (1) Pre-Surgery Clear Ensure at : 8:30 AM the morning of surgery. Drink in one sitting. Do not sip.  This drink was given to you during your hospital  pre-op appointment visit. Nothing else to drink after completing the  Pre-Surgery Clear Ensure or G2.          If you have questions, please contact your surgeon's office.    Oral Hygiene is also important to reduce your risk of infection.                                     Remember - BRUSH YOUR TEETH THE MORNING OF SURGERY WITH YOUR REGULAR TOOTHPASTE  DENTURES WILL BE REMOVED PRIOR TO SURGERY PLEASE DO NOT APPLY "Poly grip" OR ADHESIVES!!!   Do NOT smoke after Midnight   Take these medicines the morning of surgery with A SIP OF WATER: alprazolam,lamotrigine,topiramate,citalopram,clonidine,synthroid,omeprazole.Use inhalers as usual.  You may not have any metal on your body including hair pins, jewelry, and body piercing             Do not wear make-up, lotions, powders, perfumes/cologne, or deodorant  Do not wear nail polish including gel and S&S, artificial/acrylic nails, or any other type of covering on natural nails including finger and toenails. If you have artificial nails, gel coating, etc. that needs to be removed by a nail salon please have this removed prior to surgery or surgery may need to be canceled/ delayed if the surgeon/ anesthesia feels like they are unable to be safely monitored.   Do not shave  48 hours prior to surgery.    Do not bring valuables to the hospital. [facility] IS NOT             RESPONSIBLE   FOR VALUABLES.   Contacts, glasses, or bridgework may not be worn into surgery.   Bring small overnight bag day of surgery.   DO NOT BRING YOUR HOME MEDICATIONS TO THE HOSPITAL. PHARMACY WILL DISPENSE MEDICATIONS LISTED ON YOUR MEDICATION LIST TO YOU DURING YOUR ADMISSION IN THE HOSPITAL!    Patients discharged on the day of surgery will not be allowed to drive home.  Someone NEEDS to stay with you for the first 24 hours after anesthesia.   Special Instructions: Bring a copy of your healthcare power of attorney and living will documents         the day of surgery if you haven't scanned them before.              Please read over the following fact sheets you were given: IF YOU HAVE QUESTIONS ABOUT YOUR PRE-OP INSTRUCTIONS PLEASE CALL [phone]    [facility] - Preparing for Surgery Before surgery, you can  play an important role.  Because skin is not sterile, your skin needs to be as free of germs as possible.  You can reduce the number of germs on your skin by washing with CHG (chlorahexidine gluconate) soap before surgery.  CHG is an antiseptic cleaner which kills germs and bonds with the skin to continue killing germs even after washing. Please DO NOT use if you have an allergy to CHG or antibacterial soaps.  If your skin becomes reddened/irritated stop using the CHG and inform your nurse when you arrive at Short Stay. Do not shave (including legs and underarms) for at least 48 hours prior to the first CHG shower.  You may shave your face/neck. Please follow these instructions carefully:  1.  Shower with CHG Soap the night before surgery and the  morning of Surgery.  2.  If you choose to wash your hair, wash your hair first as usual with your  normal  shampoo.  3.  After you shampoo, rinse your hair and body thoroughly to remove the  shampoo.                           4.  Use CHG as you would any other liquid soap.  You can apply chg directly  to the skin and wash                       Gently with a scrungie or clean washcloth.  5.  Apply the CHG Soap to your body ONLY FROM THE NECK DOWN.   Do not use on face/ open  Wound or open sores. Avoid contact with eyes, ears mouth and genitals (private parts).                       Wash face,  Genitals (private parts) with your normal soap.             6.  Wash thoroughly, paying special attention to the area where your surgery  will be performed.  7.  Thoroughly rinse your body with warm water from the neck down.  8.  DO NOT shower/wash with your normal soap after using and rinsing off  the CHG Soap.                9.  Pat yourself dry with a clean towel.            10.  Wear clean pajamas.            11.  Place clean sheets on your bed the night of your first shower and do not  sleep with pets. Day of Surgery : Do not apply any  lotions/deodorants the morning of surgery.  Please wear clean clothes to the hospital/surgery center.  FAILURE TO FOLLOW THESE INSTRUCTIONS MAY RESULT IN THE CANCELLATION OF YOUR SURGERY PATIENT SIGNATURE_________________________________  NURSE SIGNATURE__________________________________  ________________________________________________________________________

## 2023-03-27 ENCOUNTER — Encounter (HOSPITAL_COMMUNITY): Payer: Self-pay

## 2023-03-27 ENCOUNTER — Encounter (HOSPITAL_COMMUNITY)
Admission: RE | Admit: 2023-03-27 | Discharge: 2023-03-27 | Disposition: A | Discharge status: Home / Self Care | Payer: Medicare HMO | Source: Ambulatory Visit | Attending: Specialist | Admitting: Specialist

## 2023-03-27 ENCOUNTER — Other Ambulatory Visit: Payer: Self-pay

## 2023-03-27 VITALS — BP 118/62 | HR 69 | Temp 97.7°F | Ht 67.0 in | Wt 115.0 lb

## 2023-03-27 DIAGNOSIS — Z01812 Encounter for preprocedural laboratory examination: Secondary | ICD-10-CM | POA: Insufficient documentation

## 2023-03-27 DIAGNOSIS — I2510 Atherosclerotic heart disease of native coronary artery without angina pectoris: Secondary | ICD-10-CM | POA: Diagnosis not present

## 2023-03-27 HISTORY — DX: Depression, unspecified: F32.A

## 2023-03-27 LAB — BASIC METABOLIC PANEL
Anion gap: 7 (ref 5–15)
BUN: 9 mg/dL (ref 8–23)
CO2: 21 mmol/L — ABNORMAL LOW (ref 22–32)
Calcium: 8.3 mg/dL — ABNORMAL LOW (ref 8.9–10.3)
Chloride: 105 mmol/L (ref 98–111)
Creatinine, Ser: 0.67 mg/dL (ref 0.44–1.00)
GFR, Estimated: >60 mL/min (ref >60)
Glucose, Bld: 101 mg/dL — ABNORMAL HIGH (ref 70–99)
Potassium: 4.3 mmol/L (ref 3.5–5.1)
Sodium: 133 mmol/L — ABNORMAL LOW (ref 135–145)

## 2023-03-27 LAB — CBC
HCT: 42.7 % (ref 36.0–46.0)
Hemoglobin: 14 g/dL (ref 12.0–15.0)
MCH: 30.9 pg (ref 26.0–34.0)
MCHC: 32.8 g/dL (ref 30.0–36.0)
MCV: 94.3 fL (ref 80.0–100.0)
Platelets: 243 10*3/uL (ref 150–400)
RBC: 4.53 MIL/uL (ref 3.87–5.11)
RDW: 13.2 % (ref 11.5–15.5)
WBC: 5.9 10*3/uL (ref 4.0–10.5)
nRBC: 0 % (ref 0.0–0.2)

## 2023-03-27 NOTE — Progress Notes (Signed)
For Short Stay: COVID SWAB appointment date:  Bowel Prep reminder:   For Anesthesia: PCP - Glyn Ade, PA-C  Cardiologist - Parke Poisson, MD  Clearance: Eligha Bridegroom: NP: 02/28/23 Pulmunologist: Micheline Maze Chest x-ray - 02/28/23 EKG - 10/04/22 Stress Test -  ECHO - 02/13/23 Cardiac Cath -  Pacemaker/ICD device last checked: Pacemaker orders received: Device Rep notified:  Spinal Cord Stimulator: N/A  Sleep Study - N/A CPAP -   Fasting Blood Sugar - N/A Checks Blood Sugar _____ times a day Date and result of last Hgb A1c-  Last dose of GLP1 agonist- N/A GLP1 instructions:   Last dose of SGLT-2 inhibitors- N/A SGLT-2 instructions:   Blood Thinner Instructions: Aspirin Instructions: On hold Last Dose: 03/25/23  Activity level: Can go up a flight of stairs and activities of daily living without stopping and without chest pain and/or shortness of breath   Able to exercise without chest pain and/or shortness of breath   Anesthesia review: Hx: MVP (mitral valve prolapse ,COPD ,smoker.  Patient denies shortness of breath, fever, cough and chest pain at PAT appointment   Patient verbalized understanding of instructions that were given to them at the PAT appointment. Patient was also instructed that they will need to review over the PAT instructions again at home before surgery.

## 2023-03-29 ENCOUNTER — Ambulatory Visit: Payer: Self-pay | Admitting: Orthopedic Surgery

## 2023-03-29 NOTE — H&P (Signed)
[name] is an 67 y.o. female.   Chief Complaint: right knee pain HPI: Visit For: Follow up (to discuss MRI) Location: right; knee Duration: 3 weeks; 01/07/23 Severity: pain level 6/10 Alleviating Factors: brace; Hydrocodone prn Aggravating Factors: weightbearing (walking) Associated Symptoms: popping/clicking; grinding; instability  Past Medical History:  Diagnosis Date   Bipolar disorder (HCC)    PT ON TOPIRAMATE AND ZANAX- SEES PSYCHIATRIST DR. COTTLE - PT STATES DOING WELL ON MEDICATIONS   COPD (chronic obstructive pulmonary disease) (HCC)    OCCAS USE OF INHALER-QUIT SMOKING 2014 - USING E- CIGARETTE   Depression    Diverticulitis    GERD (gastroesophageal reflux disease)    H/O suicide attempt 09/18/2005   PT'S SON HAD COMMITTED SUICIDE AND SHE WAS OVER COME WITH GRIEF   Hypothyroidism    IBS (irritable bowel syndrome)    Lichen simplex chronicus    MVP (mitral valve prolapse)    DOES NOT CAUSE ANY PROBLEMS   Osteoporosis    Pain    RIGHT SHOULDER AND LIMITED ROM - -RT SHOULDER IMPINGEMENT, ADHESIVE CAPSULITIS, ROTATOR CUFF TEAR   Pain    NECK PAIN -PT HOPING SURGERY ON HER SHOULDER HELPS HER NECK PAIN- DOES HAVE HX OF CERBVICAL DISK FUSION.   Pain    LUMBAR PAIN - DDD AND PREVIOUS LUMBAR SURGERY   Vitamin D deficiency     Past Surgical History:  Procedure Laterality Date   ABDOMINAL HYSTERECTOMY  1975   BACK SURGERY     LUMBAR SURGERY FOR HNP   BREAST SURGERY     BREAST REDUCTION   CERVICAL DISK FUSION     EYE SURGERY     LEFT OOPHORECTOMY  1994   REDUCTION MAMMAPLASTY     SHOULDER ARTHROSCOPY Left    SHOULDER ARTHROSCOPY WITH SUBACROMIAL DECOMPRESSION AND OPEN ROTATOR C Right 02/19/2014   Procedure: RIGHT SHOULDER ARTHROSCOPY SUBACROMIAL DECOMPRESSION EVALUATION UNDER ANESTHESIA AND  MANIPULATION UNDER ANESTHIESIA DEBRIDEMENT OF PARTIAL ROTATOR CUFF;  Surgeon: Javier Docker, MD;  Location: [facility];  Service: Orthopedics;  Laterality: Right;     Family History  Problem Relation Age of Onset   Heart failure Mother    Heart disease Mother    Stroke Mother    Heart disease Father    Heart attack Paternal Grandmother    Social History:  reports that she has been smoking cigarettes. She has a 55 pack-year smoking history. She uses smokeless tobacco. She reports that she does not drink alcohol and does not use drugs.  Allergies:  Allergies  Allergen Reactions   Alendronate Sodium Shortness Of Breath   Fiorinal [Butalbital-Aspirin-Caffeine] Hives and Swelling    SWELLING OF LIPS AND HIVES   Hydrocodone Bit-Homatrop Mbr     Other reaction(s): nausea   Iodinated Contrast Media Hives   Iohexol      Code: HIVES, Desc: omnipaque 300-doc'd by KB on 04-13-06.Marland KitchenMarland Kitchenpt states she gets severe hives    Klonopin [Clonazepam]     PT STATES SHE OVERDOSED ON KLONOPIN   Levothyroxine Hives    Other reaction(s): hives with the generic   Other     THE PURPLE DYE IN GENERIC SYNTHROID ( LEVOTHYROXINE ) CAUSED BREAKING OUT FACE AND SCALP.  PT CAN TAKE SYNTHROID.   Strawberry (Diagnostic)     Other reaction(s): canker sores in mouth   Tramadol Other (See Comments)    depression   Tramadol-Acetaminophen     Other reaction(s): depressed   Dexilant [Dexlansoprazole] Diarrhea    Current meds:  albuterol sulfate HFA 90 mcg/actuation aerosol inhaler ALPRAZolam 0.5 mg tablet atorvastatin 80 mg tablet Breztri Aerosphere 160 mcg-66mcg-4.8mcg/actuation HFA aerosol inhaler brompheniramine-pseudoephedrine-DM 2 mg-30 mg-10 mg/5 mL oral syrup citalopram 20 mg tablet cloNIDine HCL 0.1 mg tablet clotrimazole 10 mg troche ergocalciferol (vitamin D2) 1,250 mcg (50,000 unit) capsule HYDROcodone 5 mg-acetaminophen 325 mg tablet ipratropium bromide 42 mcg (0.06 %) nasal spray lamoTRIgine 25 mg tablet omeprazole 40 mg capsule,delayed release Synthroid 88 mcg tablet topiramate 100 mg tablet Trelegy Ellipta 100 mcg-62.5 mcg-25 mcg powder for  inhalation   Results for orders placed or performed during the hospital encounter of 03/27/23 (from the past 48 hour(s))  Basic metabolic panel per protocol     Status: Abnormal   Collection Time: 03/27/23  1:25 PM  Result Value Ref Range   Sodium 133 (L) 135 - 145 mmol/L   Potassium 4.3 3.5 - 5.1 mmol/L   Chloride 105 98 - 111 mmol/L   CO2 21 (L) 22 - 32 mmol/L   Glucose, Bld 101 (H) 70 - 99 mg/dL    Comment: Glucose reference range applies only to samples taken after fasting for at least 8 hours.   BUN 9 8 - 23 mg/dL   Creatinine, Ser 6.29 0.44 - 1.00 mg/dL   Calcium 8.3 (L) 8.9 - 10.3 mg/dL   GFR, Estimated >52 >84 mL/min    Comment: (NOTE) Calculated using the CKD-EPI Creatinine Equation (2021)    Anion gap 7 5 - 15    Comment: Performed at [facility], [address] [place], [place] [address]  CBC per protocol     Status: None   Collection Time: 03/27/23  1:25 PM  Result Value Ref Range   WBC 5.9 4.0 - 10.5 K/uL   RBC 4.53 3.87 - 5.11 MIL/uL   Hemoglobin 14.0 12.0 - 15.0 g/dL   HCT 01.0 27.2 - 53.6 %   MCV 94.3 80.0 - 100.0 fL   MCH 30.9 26.0 - 34.0 pg   MCHC 32.8 30.0 - 36.0 g/dL   RDW 64.4 03.4 - 74.2 %   Platelets 243 150 - 400 K/uL   nRBC 0.0 0.0 - 0.2 %    Comment: Performed at [facility], [address] [place], [place] [address]   No results found.  Review of Systems  Constitutional: Negative.   HENT: Negative.    Eyes: Negative.   Respiratory: Negative.    Cardiovascular: Negative.   Gastrointestinal: Negative.   Endocrine: Negative.   Genitourinary: Negative.   Musculoskeletal:  Positive for arthralgias, gait problem, joint swelling and myalgias.  Skin: Negative.     There were no vitals taken for this visit. Physical Exam Constitutional:      Appearance: Normal appearance.  HENT:     Head: Normocephalic and atraumatic.     Right Ear: External ear normal.     Left Ear: External ear normal.     Nose:  Nose normal.     Mouth/Throat:     Pharynx: Oropharynx is clear.  Eyes:     Conjunctiva/sclera: Conjunctivae normal.  Cardiovascular:     Rate and Rhythm: Normal rate and regular rhythm.     Pulses: Normal pulses.     Heart sounds: Normal heart sounds.  Pulmonary:     Effort: Pulmonary effort is normal.     Breath sounds: Normal breath sounds.  Abdominal:     General: Bowel sounds are normal.  Musculoskeletal:     Cervical back: Normal range  of motion.     Comments: Right knee tender medial joint line. Patellofemoral pain compression. Positive McMurray no DVT ranges 0-1 40. Mild effusion  Skin:    General: Skin is warm and dry.  Neurological:     Mental Status: She is alert.    MRI indicates horizontal tear on the posterior medial meniscus posterior horn body junction. Areas of focal full-thickness cartilage loss over the anterior medial tibial plateau. ACL collateral ligaments intact.  Assessment/Plan Impression:  1. Patient with refractory symptomatic medial meniscus tear of the right knee with mechanical symptoms despite rest activity modification physical therapy and multiple injections 2. Underlying osteoporosis on Prolia  Plan:  We discussed options at this point in time failing conservative treatment mechanical symptoms and the presence of a meniscus tear we discussed the knee arthroscopy partial meniscectomy possible microfracture due to small grade 4 lesions.  I discussed the risk and benefits of knee arthroscopy including no changes in their symptoms worsening in their symptoms DVT, PE, anesthetic complications etc. Surgical possibilities include chondroplasty, microfracture, partial meniscectomy, plica excision etc. We also discussed the possible need for repeat arthroscopy in the future as well as possible continued treatment including corticosteroid injections and possible Visco supplementation. In addition we discussed the possibility of even eventually required a  total knee replacement if significant arthritis encountered. Also indicating that it is an outpatient procedure. 1-2 days on crutches. Postoperative DVT prophylaxis with aspirin if tolerated. Follow-up in the office in 2 weeks following the surgery. Possible consideration of formal of supervised physical therapy as well. History of DVT or MRSA. Preoperatively postoperatively oxycodone and aspirin and crutches.  Plan Right knee scope, partial medial meniscectomy, debridement, possible microfracture  Dorothy Spark, PA-C for Dr Shelle Iron 03/29/2023, 11:59 AM

## 2023-03-29 NOTE — H&P (View-Only) (Signed)
 [name] is an 67 y.o. female.   Chief Complaint: right knee pain HPI: Visit For: Follow up (to discuss MRI) Location: right; knee Duration: 3 weeks; 01/07/23 Severity: pain level 6/10 Alleviating Factors: brace; Hydrocodone prn Aggravating Factors: weightbearing (walking) Associated Symptoms: popping/clicking; grinding; instability  Past Medical History:  Diagnosis Date   Bipolar disorder (HCC)    PT ON TOPIRAMATE AND ZANAX- SEES PSYCHIATRIST DR. COTTLE - PT STATES DOING WELL ON MEDICATIONS   COPD (chronic obstructive pulmonary disease) (HCC)    OCCAS USE OF INHALER-QUIT SMOKING 2014 - USING E- CIGARETTE   Depression    Diverticulitis    GERD (gastroesophageal reflux disease)    H/O suicide attempt 09/18/2005   PT'S SON HAD COMMITTED SUICIDE AND SHE WAS OVER COME WITH GRIEF   Hypothyroidism    IBS (irritable bowel syndrome)    Lichen simplex chronicus    MVP (mitral valve prolapse)    DOES NOT CAUSE ANY PROBLEMS   Osteoporosis    Pain    RIGHT SHOULDER AND LIMITED ROM - -RT SHOULDER IMPINGEMENT, ADHESIVE CAPSULITIS, ROTATOR CUFF TEAR   Pain    NECK PAIN -PT HOPING SURGERY ON HER SHOULDER HELPS HER NECK PAIN- DOES HAVE HX OF CERBVICAL DISK FUSION.   Pain    LUMBAR PAIN - DDD AND PREVIOUS LUMBAR SURGERY   Vitamin D deficiency     Past Surgical History:  Procedure Laterality Date   ABDOMINAL HYSTERECTOMY  1975   BACK SURGERY     LUMBAR SURGERY FOR HNP   BREAST SURGERY     BREAST REDUCTION   CERVICAL DISK FUSION     EYE SURGERY     LEFT OOPHORECTOMY  1994   REDUCTION MAMMAPLASTY     SHOULDER ARTHROSCOPY Left    SHOULDER ARTHROSCOPY WITH SUBACROMIAL DECOMPRESSION AND OPEN ROTATOR C Right 02/19/2014   Procedure: RIGHT SHOULDER ARTHROSCOPY SUBACROMIAL DECOMPRESSION EVALUATION UNDER ANESTHESIA AND  MANIPULATION UNDER ANESTHIESIA DEBRIDEMENT OF PARTIAL ROTATOR CUFF;  Surgeon: Javier Docker, MD;  Location: [facility];  Service: Orthopedics;  Laterality: Right;     Family History  Problem Relation Age of Onset   Heart failure Mother    Heart disease Mother    Stroke Mother    Heart disease Father    Heart attack Paternal Grandmother    Social History:  reports that she has been smoking cigarettes. She has a 55 pack-year smoking history. She uses smokeless tobacco. She reports that she does not drink alcohol and does not use drugs.  Allergies:  Allergies  Allergen Reactions   Alendronate Sodium Shortness Of Breath   Fiorinal [Butalbital-Aspirin-Caffeine] Hives and Swelling    SWELLING OF LIPS AND HIVES   Hydrocodone Bit-Homatrop Mbr     Other reaction(s): nausea   Iodinated Contrast Media Hives   Iohexol      Code: HIVES, Desc: omnipaque 300-doc'd by KB on 04-13-06.Marland KitchenMarland Kitchenpt states she gets severe hives    Klonopin [Clonazepam]     PT STATES SHE OVERDOSED ON KLONOPIN   Levothyroxine Hives    Other reaction(s): hives with the generic   Other     THE PURPLE DYE IN GENERIC SYNTHROID ( LEVOTHYROXINE ) CAUSED BREAKING OUT FACE AND SCALP.  PT CAN TAKE SYNTHROID.   Strawberry (Diagnostic)     Other reaction(s): canker sores in mouth   Tramadol Other (See Comments)    depression   Tramadol-Acetaminophen     Other reaction(s): depressed   Dexilant [Dexlansoprazole] Diarrhea    Current meds:  albuterol sulfate HFA 90 mcg/actuation aerosol inhaler ALPRAZolam 0.5 mg tablet atorvastatin 80 mg tablet Breztri Aerosphere 160 mcg-66mcg-4.8mcg/actuation HFA aerosol inhaler brompheniramine-pseudoephedrine-DM 2 mg-30 mg-10 mg/5 mL oral syrup citalopram 20 mg tablet cloNIDine HCL 0.1 mg tablet clotrimazole 10 mg troche ergocalciferol (vitamin D2) 1,250 mcg (50,000 unit) capsule HYDROcodone 5 mg-acetaminophen 325 mg tablet ipratropium bromide 42 mcg (0.06 %) nasal spray lamoTRIgine 25 mg tablet omeprazole 40 mg capsule,delayed release Synthroid 88 mcg tablet topiramate 100 mg tablet Trelegy Ellipta 100 mcg-62.5 mcg-25 mcg powder for  inhalation   Results for orders placed or performed during the hospital encounter of 03/27/23 (from the past 48 hour(s))  Basic metabolic panel per protocol     Status: Abnormal   Collection Time: 03/27/23  1:25 PM  Result Value Ref Range   Sodium 133 (L) 135 - 145 mmol/L   Potassium 4.3 3.5 - 5.1 mmol/L   Chloride 105 98 - 111 mmol/L   CO2 21 (L) 22 - 32 mmol/L   Glucose, Bld 101 (H) 70 - 99 mg/dL    Comment: Glucose reference range applies only to samples taken after fasting for at least 8 hours.   BUN 9 8 - 23 mg/dL   Creatinine, Ser 6.29 0.44 - 1.00 mg/dL   Calcium 8.3 (L) 8.9 - 10.3 mg/dL   GFR, Estimated >52 >84 mL/min    Comment: (NOTE) Calculated using the CKD-EPI Creatinine Equation (2021)    Anion gap 7 5 - 15    Comment: Performed at [facility], [address] [place], [place] [address]  CBC per protocol     Status: None   Collection Time: 03/27/23  1:25 PM  Result Value Ref Range   WBC 5.9 4.0 - 10.5 K/uL   RBC 4.53 3.87 - 5.11 MIL/uL   Hemoglobin 14.0 12.0 - 15.0 g/dL   HCT 01.0 27.2 - 53.6 %   MCV 94.3 80.0 - 100.0 fL   MCH 30.9 26.0 - 34.0 pg   MCHC 32.8 30.0 - 36.0 g/dL   RDW 64.4 03.4 - 74.2 %   Platelets 243 150 - 400 K/uL   nRBC 0.0 0.0 - 0.2 %    Comment: Performed at [facility], [address] [place], [place] [address]   No results found.  Review of Systems  Constitutional: Negative.   HENT: Negative.    Eyes: Negative.   Respiratory: Negative.    Cardiovascular: Negative.   Gastrointestinal: Negative.   Endocrine: Negative.   Genitourinary: Negative.   Musculoskeletal:  Positive for arthralgias, gait problem, joint swelling and myalgias.  Skin: Negative.     There were no vitals taken for this visit. Physical Exam Constitutional:      Appearance: Normal appearance.  HENT:     Head: Normocephalic and atraumatic.     Right Ear: External ear normal.     Left Ear: External ear normal.     Nose:  Nose normal.     Mouth/Throat:     Pharynx: Oropharynx is clear.  Eyes:     Conjunctiva/sclera: Conjunctivae normal.  Cardiovascular:     Rate and Rhythm: Normal rate and regular rhythm.     Pulses: Normal pulses.     Heart sounds: Normal heart sounds.  Pulmonary:     Effort: Pulmonary effort is normal.     Breath sounds: Normal breath sounds.  Abdominal:     General: Bowel sounds are normal.  Musculoskeletal:     Cervical back: Normal range  of motion.     Comments: Right knee tender medial joint line. Patellofemoral pain compression. Positive McMurray no DVT ranges 0-1 40. Mild effusion  Skin:    General: Skin is warm and dry.  Neurological:     Mental Status: She is alert.    MRI indicates horizontal tear on the posterior medial meniscus posterior horn body junction. Areas of focal full-thickness cartilage loss over the anterior medial tibial plateau. ACL collateral ligaments intact.  Assessment/Plan Impression:  1. Patient with refractory symptomatic medial meniscus tear of the right knee with mechanical symptoms despite rest activity modification physical therapy and multiple injections 2. Underlying osteoporosis on Prolia  Plan:  We discussed options at this point in time failing conservative treatment mechanical symptoms and the presence of a meniscus tear we discussed the knee arthroscopy partial meniscectomy possible microfracture due to small grade 4 lesions.  I discussed the risk and benefits of knee arthroscopy including no changes in their symptoms worsening in their symptoms DVT, PE, anesthetic complications etc. Surgical possibilities include chondroplasty, microfracture, partial meniscectomy, plica excision etc. We also discussed the possible need for repeat arthroscopy in the future as well as possible continued treatment including corticosteroid injections and possible Visco supplementation. In addition we discussed the possibility of even eventually required a  total knee replacement if significant arthritis encountered. Also indicating that it is an outpatient procedure. 1-2 days on crutches. Postoperative DVT prophylaxis with aspirin if tolerated. Follow-up in the office in 2 weeks following the surgery. Possible consideration of formal of supervised physical therapy as well. History of DVT or MRSA. Preoperatively postoperatively oxycodone and aspirin and crutches.  Plan Right knee scope, partial medial meniscectomy, debridement, possible microfracture  Dorothy Spark, PA-C for Dr Shelle Iron 03/29/2023, 11:59 AM

## 2023-03-30 ENCOUNTER — Telehealth: Payer: Self-pay | Admitting: Psychiatry

## 2023-03-30 NOTE — Telephone Encounter (Signed)
PT lvn that she would like ask dr. Jennelle Human a question. Please call her at [phone]

## 2023-03-30 NOTE — Telephone Encounter (Signed)
Mailbox is full.

## 2023-03-30 NOTE — Telephone Encounter (Signed)
Patient said she just wants to talk to you. Has an appt scheduled for 10/16. She is having knee surgery next week. She reports there is "something wrong with me." She's said there have been 2 deaths, one July 4th and one this morning and she is not able to cry about either one. She knows it sounds cold.

## 2023-04-04 ENCOUNTER — Encounter (HOSPITAL_COMMUNITY)
Admission: RE | Disposition: A | Discharge status: Home / Self Care | Payer: Self-pay | Source: Home / Self Care | Attending: Specialist

## 2023-04-04 ENCOUNTER — Ambulatory Visit (HOSPITAL_BASED_OUTPATIENT_CLINIC_OR_DEPARTMENT_OTHER): Payer: Medicare HMO | Admitting: Anesthesiology

## 2023-04-04 ENCOUNTER — Encounter (HOSPITAL_COMMUNITY): Payer: Self-pay | Admitting: Specialist

## 2023-04-04 ENCOUNTER — Ambulatory Visit (HOSPITAL_COMMUNITY): Payer: Medicare HMO | Admitting: Anesthesiology

## 2023-04-04 ENCOUNTER — Ambulatory Visit (HOSPITAL_COMMUNITY)
Admission: RE | Admit: 2023-04-04 | Discharge: 2023-04-04 | Disposition: A | Discharge status: Home / Self Care | Payer: Medicare HMO | Attending: Specialist | Admitting: Specialist

## 2023-04-04 ENCOUNTER — Other Ambulatory Visit: Payer: Self-pay

## 2023-04-04 DIAGNOSIS — X58XXXA Exposure to other specified factors, initial encounter: Secondary | ICD-10-CM | POA: Insufficient documentation

## 2023-04-04 DIAGNOSIS — F17200 Nicotine dependence, unspecified, uncomplicated: Secondary | ICD-10-CM | POA: Diagnosis not present

## 2023-04-04 DIAGNOSIS — M1711 Unilateral primary osteoarthritis, right knee: Secondary | ICD-10-CM | POA: Insufficient documentation

## 2023-04-04 DIAGNOSIS — S83241A Other tear of medial meniscus, current injury, right knee, initial encounter: Secondary | ICD-10-CM | POA: Insufficient documentation

## 2023-04-04 DIAGNOSIS — S83281A Other tear of lateral meniscus, current injury, right knee, initial encounter: Secondary | ICD-10-CM | POA: Insufficient documentation

## 2023-04-04 DIAGNOSIS — M94261 Chondromalacia, right knee: Secondary | ICD-10-CM | POA: Diagnosis not present

## 2023-04-04 HISTORY — PX: KNEE ARTHROSCOPY WITH MEDIAL MENISECTOMY: SHX5651

## 2023-04-04 SURGERY — ARTHROSCOPY, KNEE, WITH MEDIAL MENISCECTOMY
Anesthesia: General | Site: Knee | Laterality: Right

## 2023-04-04 MED ORDER — LIDOCAINE HCL (PF) 2 % IJ SOLN
INTRAMUSCULAR | Status: AC
  Filled 2023-04-04: qty 5

## 2023-04-04 MED ORDER — PHENYLEPHRINE 80 MCG/ML (10ML) SYRINGE FOR IV PUSH (FOR BLOOD PRESSURE SUPPORT)
PREFILLED_SYRINGE | INTRAVENOUS | Status: AC
  Filled 2023-04-04: qty 10

## 2023-04-04 MED ORDER — SODIUM CHLORIDE 0.9 % IR SOLN
Status: DC | PRN
  Administered 2023-04-04: 1000 mL

## 2023-04-04 MED ORDER — CEFAZOLIN SODIUM-DEXTROSE 2-4 GM/100ML-% IV SOLN
2.0000 g | INTRAVENOUS | Status: AC
  Administered 2023-04-04: 2 g via INTRAVENOUS
  Filled 2023-04-04: qty 100

## 2023-04-04 MED ORDER — DOCUSATE SODIUM 100 MG PO CAPS: 100.0000 mg | ORAL_CAPSULE | Freq: Two times a day (BID) | ORAL | 1 refills | Status: AC | PRN

## 2023-04-04 MED ORDER — HYDROMORPHONE HCL 1 MG/ML IJ SOLN: 0.2500 mg | INTRAMUSCULAR | Status: DC | PRN

## 2023-04-04 MED ORDER — ONDANSETRON HCL 4 MG/2ML IJ SOLN
INTRAMUSCULAR | Status: AC
  Filled 2023-04-04: qty 2

## 2023-04-04 MED ORDER — PROPOFOL 10 MG/ML IV BOLUS
INTRAVENOUS | Status: AC
  Filled 2023-04-04: qty 20

## 2023-04-04 MED ORDER — FENTANYL CITRATE (PF) 100 MCG/2ML IJ SOLN
INTRAMUSCULAR | Status: DC | PRN
  Administered 2023-04-04: 100 ug via INTRAVENOUS

## 2023-04-04 MED ORDER — DEXAMETHASONE SODIUM PHOSPHATE 10 MG/ML IJ SOLN
INTRAMUSCULAR | Status: AC
  Filled 2023-04-04: qty 1

## 2023-04-04 MED ORDER — CHLORHEXIDINE GLUCONATE 0.12 % MT SOLN
15.0000 mL | Freq: Once | OROMUCOSAL | Status: AC
  Administered 2023-04-04: 15 mL via OROMUCOSAL

## 2023-04-04 MED ORDER — FENTANYL CITRATE (PF) 100 MCG/2ML IJ SOLN
INTRAMUSCULAR | Status: AC
  Filled 2023-04-04: qty 2

## 2023-04-04 MED ORDER — ONDANSETRON HCL 4 MG/2ML IJ SOLN
INTRAMUSCULAR | Status: DC | PRN
  Administered 2023-04-04: 4 mg via INTRAVENOUS

## 2023-04-04 MED ORDER — EPHEDRINE 5 MG/ML INJ
INTRAVENOUS | Status: AC
  Filled 2023-04-04: qty 5

## 2023-04-04 MED ORDER — PROPOFOL 10 MG/ML IV BOLUS
INTRAVENOUS | Status: DC | PRN
Start: 2023-04-04 — End: 2023-04-04
  Administered 2023-04-04: 200 mg via INTRAVENOUS

## 2023-04-04 MED ORDER — MIDAZOLAM HCL 2 MG/2ML IJ SOLN: 1.0000 mg | INTRAMUSCULAR | Status: DC

## 2023-04-04 MED ORDER — LACTATED RINGERS IV SOLN: INTRAVENOUS | Status: DC

## 2023-04-04 MED ORDER — AMISULPRIDE (ANTIEMETIC) 5 MG/2ML IV SOLN: 10.0000 mg | Freq: Once | INTRAVENOUS | Status: DC | PRN

## 2023-04-04 MED ORDER — OXYCODONE HCL 5 MG PO TABS: 5.0000 mg | ORAL_TABLET | Freq: Once | ORAL | Status: DC | PRN

## 2023-04-04 MED ORDER — ORAL CARE MOUTH RINSE: 15.0000 mL | Freq: Once | OROMUCOSAL | Status: AC

## 2023-04-04 MED ORDER — PROMETHAZINE HCL 25 MG/ML IJ SOLN: 6.2500 mg | INTRAMUSCULAR | Status: DC | PRN

## 2023-04-04 MED ORDER — EPINEPHRINE PF 1 MG/ML IJ SOLN
INTRAMUSCULAR | Status: AC
  Filled 2023-04-04: qty 3

## 2023-04-04 MED ORDER — PHENYLEPHRINE HCL (PRESSORS) 10 MG/ML IV SOLN
INTRAVENOUS | Status: DC | PRN
  Administered 2023-04-04 (×3): 100 ug via INTRAVENOUS

## 2023-04-04 MED ORDER — MEPERIDINE HCL 50 MG/ML IJ SOLN: 6.2500 mg | INTRAMUSCULAR | Status: DC | PRN

## 2023-04-04 MED ORDER — MIDAZOLAM HCL 2 MG/2ML IJ SOLN
INTRAMUSCULAR | Status: AC
  Filled 2023-04-04: qty 2

## 2023-04-04 MED ORDER — OXYCODONE HCL 5 MG/5ML PO SOLN: 5.0000 mg | Freq: Once | ORAL | Status: DC | PRN

## 2023-04-04 MED ORDER — OXYCODONE HCL 5 MG PO TABS: 5.0000 mg | ORAL_TABLET | ORAL | 0 refills | Status: DC | PRN

## 2023-04-04 MED ORDER — EPHEDRINE SULFATE (PRESSORS) 50 MG/ML IJ SOLN
INTRAMUSCULAR | Status: DC | PRN
  Administered 2023-04-04 (×2): 5 mg via INTRAVENOUS

## 2023-04-04 MED ORDER — BUPIVACAINE HCL (PF) 0.5 % IJ SOLN
INTRAMUSCULAR | Status: AC
  Filled 2023-04-04: qty 30

## 2023-04-04 MED ORDER — FENTANYL CITRATE PF 50 MCG/ML IJ SOSY: 50.0000 ug | PREFILLED_SYRINGE | INTRAMUSCULAR | Status: DC

## 2023-04-04 MED ORDER — MIDAZOLAM HCL 5 MG/5ML IJ SOLN
INTRAMUSCULAR | Status: DC | PRN
  Administered 2023-04-04: 2 mg via INTRAVENOUS

## 2023-04-04 MED ORDER — DEXAMETHASONE SODIUM PHOSPHATE 10 MG/ML IJ SOLN
INTRAMUSCULAR | Status: DC | PRN
  Administered 2023-04-04: 5 mg via INTRAVENOUS

## 2023-04-04 MED ORDER — BUPIVACAINE-EPINEPHRINE 0.5% -1:200000 IJ SOLN
INTRAMUSCULAR | Status: DC | PRN
  Administered 2023-04-04: 20 mL

## 2023-04-04 MED ORDER — LIDOCAINE HCL (CARDIAC) PF 100 MG/5ML IV SOSY
PREFILLED_SYRINGE | INTRAVENOUS | Status: DC | PRN
  Administered 2023-04-04: 40 mg via INTRAVENOUS

## 2023-04-04 SURGICAL SUPPLY — 29 items
BAG COUNTER SPONGE SURGICOUNT (BAG) ×2 IMPLANT
BAG SPNG CNTER NS LX DISP (BAG) ×1
BLADE SHAVER TORPEDO 4X13 (MISCELLANEOUS) IMPLANT
BNDG CMPR 6 X 5 YARDS HK CLSR (GAUZE/BANDAGES/DRESSINGS) ×1
BNDG CMPR 6"X 5 YARDS HK CLSR (GAUZE/BANDAGES/DRESSINGS) ×1
BNDG ELASTIC 6INX 5YD STR LF (GAUZE/BANDAGES/DRESSINGS) ×2 IMPLANT
BOOTIES KNEE HIGH SLOAN (MISCELLANEOUS) ×2 IMPLANT
COVER SURGICAL LIGHT HANDLE (MISCELLANEOUS) ×2 IMPLANT
DURAPREP 26ML APPLICATOR (WOUND CARE) ×2 IMPLANT
DW OUTFLOW CASSETTE/TUBE SET (MISCELLANEOUS) ×2 IMPLANT
GAUZE PAD ABD 8X10 STRL (GAUZE/BANDAGES/DRESSINGS) ×1 IMPLANT
GAUZE SPONGE 4X4 12PLY STRL (GAUZE/BANDAGES/DRESSINGS) ×2 IMPLANT
GLOVE BIOGEL PI IND STRL 7.0 (GLOVE) ×1 IMPLANT
GLOVE SURG SS PI 8.0 STRL IVOR (GLOVE) ×2 IMPLANT
GOWN STRL REUS W/ TWL XL LVL3 (GOWN DISPOSABLE) ×4 IMPLANT
GOWN STRL REUS W/TWL XL LVL3 (GOWN DISPOSABLE) ×2
KIT TURNOVER KIT A (KITS) IMPLANT
MANIFOLD NEPTUNE II (INSTRUMENTS) ×2 IMPLANT
PACK ARTHROSCOPY WL (CUSTOM PROCEDURE TRAY) ×2 IMPLANT
PAD CAST 4YDX4 CTTN HI CHSV (CAST SUPPLIES) IMPLANT
PADDING CAST COTTON 4X4 STRL (CAST SUPPLIES) ×1
PADDING CAST COTTON 6X4 STRL (CAST SUPPLIES) ×2 IMPLANT
PENCIL SMOKE EVACUATOR (MISCELLANEOUS) IMPLANT
SUT ETHILON 4 0 PS 2 18 (SUTURE) ×2 IMPLANT
TOWEL OR 17X26 10 PK STRL BLUE (TOWEL DISPOSABLE) ×2 IMPLANT
TUBING ARTHROSCOPY IRRIG 16FT (MISCELLANEOUS) ×1 IMPLANT
WAND APOLLORF SJ50 AR-9845 (SURGICAL WAND) IMPLANT
WIPE CHG 2% PREP (PERSONAL CARE ITEMS) ×2 IMPLANT
WRAP KNEE MAXI GEL POST OP (GAUZE/BANDAGES/DRESSINGS) ×2 IMPLANT

## 2023-04-04 NOTE — Discharge Instructions (Signed)
Keep dressing clean and dry at all times. After 2 days, remove dressing and place bandaids over incisions. Do not kneel or squat. Ice and elevate, toes above the nose, 20-30 min at a time, 4-5 times per day to reduce swelling. Take aspirin 81mg once daily to prevent blood clots. Follow up in 10-14 days for suture removal. 

## 2023-04-04 NOTE — Anesthesia Procedure Notes (Signed)
Procedure Name: LMA Insertion Date/Time: 04/04/2023 12:05 PM  Performed by: Shanon Payor, CRNAPre-anesthesia Checklist: Patient identified, Emergency Drugs available, Suction available, Patient being monitored and Timeout performed Patient Re-evaluated:Patient Re-evaluated prior to induction Oxygen Delivery Method: Circle system utilized Preoxygenation: Pre-oxygenation with 100% oxygen Induction Type: IV induction LMA: LMA inserted LMA Size: 4.0 Number of attempts: 1 Tube secured with: Tape Dental Injury: Teeth and Oropharynx as per pre-operative assessment

## 2023-04-04 NOTE — Anesthesia Preprocedure Evaluation (Signed)
Anesthesia Evaluation  Patient identified by MRN, date of birth, ID band Patient awake    Reviewed: Allergy & Precautions, H&P , NPO status , Patient's Chart, lab work & pertinent test results  Airway Mallampati: II  TM Distance: >3 FB Neck ROM: Full    Dental no notable dental hx.    Pulmonary COPD, former smoker   Pulmonary exam normal breath sounds clear to auscultation       Cardiovascular negative cardio ROS Normal cardiovascular exam Rhythm:Regular Rate:Normal     Neuro/Psych  Headaches  Anxiety Depression Bipolar Disorder      GI/Hepatic Neg liver ROS,,,  Endo/Other  Hypothyroidism    Renal/GU negative Renal ROS  negative genitourinary   Musculoskeletal negative musculoskeletal ROS (+)    Abdominal   Peds negative pediatric ROS (+)  Hematology negative hematology ROS (+)   Anesthesia Other Findings   Reproductive/Obstetrics negative OB ROS                             Anesthesia Physical Anesthesia Plan  ASA: II  Anesthesia Plan: General   Post-op Pain Management: Minimal or no pain anticipated   Induction: Intravenous  PONV Risk Score and Plan: 2 and Ondansetron, Midazolam and Treatment may vary due to age or medical condition  Airway Management Planned: LMA  Additional Equipment:   Intra-op Plan:   Post-operative Plan: Extubation in OR  Informed Consent: I have reviewed the patients History and Physical, chart, labs and discussed the procedure including the risks, benefits and alternatives for the proposed anesthesia with the patient or authorized representative who has indicated his/her understanding and acceptance.     Dental advisory given  Plan Discussed with: CRNA and Surgeon  Anesthesia Plan Comments:         Anesthesia Quick Evaluation

## 2023-04-04 NOTE — Transfer of Care (Signed)
Immediate Anesthesia Transfer of Care Note  Patient: [name]  Procedure(s) Performed: Right knee arthroscopy, partial medial and lateral meniscectomy and debridement (Right: Knee)  Patient Location: PACU  Anesthesia Type:General  Level of Consciousness: awake, alert , oriented, and patient cooperative  Airway & Oxygen Therapy: Patient Spontanous Breathing and Patient connected to nasal cannula oxygen  Post-op Assessment: Report given to RN, Post -op Vital signs reviewed and stable, and Patient moving all extremities X 4  Post vital signs: Reviewed and stable  Last Vitals:  Vitals Value Taken Time  BP 139/70 04/04/23 1303  Temp    Pulse 89 04/04/23 1305  Resp 35 04/04/23 1305  SpO2 96 % 04/04/23 1305  Vitals shown include unfiled device data.  Last Pain:  Vitals:   04/04/23 0927  TempSrc: Oral  PainSc:       Patients Stated Pain Goal: 5 (04/04/23 0925)  Complications: No notable events documented.

## 2023-04-04 NOTE — Interval H&P Note (Signed)
History and Physical Interval Note:  04/04/2023 11:25 AM  [name]  has presented today for surgery, with the diagnosis of Right knee medial meniscal tear, degenerative joint disease.  The various methods of treatment have been discussed with the patient and family. After consideration of risks, benefits and other options for treatment, the patient has consented to  Procedure(s): Right knee arthroscopy, partial medial meniscectomy and debridement, possible microfracture (Right) as a surgical intervention.  The patient's history has been reviewed, patient examined, no change in status, stable for surgery.  I have reviewed the patient's chart and labs.  Questions were answered to the patient's satisfaction.     Javier Docker

## 2023-04-04 NOTE — Brief Op Note (Signed)
04/04/2023  12:53 PM  PATIENT:  [name]  67 y.o. female  PRE-OPERATIVE DIAGNOSIS:  Right knee medial meniscal tear, degenerative joint disease  POST-OPERATIVE DIAGNOSIS:  Right knee medial meniscal tear, degenerative joint disease  PROCEDURE:  Procedure(s): Right knee arthroscopy, partial medial and lateral meniscectomy and debridement (Right)  SURGEON:  Surgeons and Role:    Jene Every, MD - Primary  PHYSICIAN ASSISTANT:   ASSISTANTS: Bissell   ANESTHESIA:   general  EBL:  10 mL   BLOOD ADMINISTERED:none  DRAINS: none   LOCAL MEDICATIONS USED:  MARCAINE     SPECIMEN:  No Specimen  DISPOSITION OF SPECIMEN:  N/A  COUNTS:  YES  TOURNIQUET:  * No tourniquets in log *  DICTATION: .Other Dictation: Dictation Number [id number]  PLAN OF CARE: Discharge to home after PACU  PATIENT DISPOSITION:  PACU - hemodynamically stable.   Delay start of Pharmacological VTE agent (>24hrs) due to surgical blood loss or risk of bleeding: no

## 2023-04-04 NOTE — Op Note (Signed)
Lynn, [name] MEDICAL RECORD NO: [id number] ACCOUNT NO: [id number] DATE OF BIRTH: 1956/03/28 FACILITY: [facility] LOCATION: [facility] PHYSICIAN: Javier Docker, MD  Operative Report   DATE OF PROCEDURE: 04/04/2023  PREOPERATIVE DIAGNOSIS:  Medial and lateral meniscus tears of right knee, osteoarthritis right knee.  POSTOPERATIVE DIAGNOSES:  Medial and lateral meniscus tears of right knee, osteoarthritis right knee, grade 4 lesion of the medial femoral condyle.  PROCEDURE PERFORMED:   1.  Right knee arthroscopy. 2.  Partial medial and lateral meniscectomies. 3.  Chondroplasty of medial femoral condyle, medial tibial plateau, patellofemoral sulcus.  ANESTHESIA:  General.  ASSISTANT:  Andrez Grime, PA, who was used throughout the case for patient positioning, placing the patient in the figure-of-four position to hold as she had a tight lateral compartment.  HISTORY:  A 66 year old with locking, popping, giving way of the knee.  MRI indicating high-grade lesion of the medial tibial plateau, meniscal tears, due to the instability.  Failing conservative treatment, she was indicated for knee arthroscopy,  partial meniscectomy and debridement.  Risks and benefits discussed including bleeding, infection, damage to neurovascular structures, no change in symptoms, worsening symptoms, DVT, PE, anesthetic complications, etc.  TECHNIQUE:  The patient in supine position, after induction of adequate general anesthesia, 2 grams Kefzol, the right lower extremity was prepped and draped in the usual sterile fashion.  A lateral parapatellar portal was fashioned with a #11 blade.   Ingress cannula atraumatically placed.  Irrigant was utilized to insufflate the joint.  Under direct visualization, a medial parapatellar portal was fashioned with a #11 blade after localization with 18-gauge needle, sparing the medial meniscus.  Noted  was extensive grade 3 change of medial femoral condyle and fairly  comprehensive lesion of the weightbearing surface of the condyle was near grade 4.  There was a tear of the medial meniscus radial between the middle and posterior thirds.  I introduced  the shaver and performed a light chondroplasty of femoral condyle.  I resected the radial tear to a stable base with a shaver.  Light chondroplasty performed of the tibial plateau.  Remnant was stable to probe palpation.  It was felt to have an extensive  area for microfracture.  ACL was unremarkable.  Lateral compartment revealed a tear in the lateral meniscus near the root.  It was approximately 60%.  This was shaved and ArthroWand was utilized at this tear.  The remnant was stable to probe palpation.  Minor grade 3 changes of the femoral condyle and  tibial plateau.  Light chondroplasty was performed here.  Suprapatellar pouch revealed extensive grade 3 change of the patella and sulcus.  Light chondroplasty performed of both.  There was normal patellofemoral tracking.  Gutters were unremarkable.  I revisited all compartments no further pathology amenable to arthroscopic intervention.  I, therefore, removed all instrumentation.  Portals were closed with 4-0 nylon simple sutures.  0.25% Marcaine with epinephrine was infiltrated in joint.  Wound was  dressed sterilely.  Awoken without difficulty and transported to the recovery room in satisfactory condition.  The patient tolerated the procedure well.  No complications.  ASSISTANT:  Andrez Grime, PA, was used throughout the case for patient positioning, to hold the patient's leg in the figure-of-four position to gain access to the lateral compartment due to tight lateral compartment.  BLOOD LOSS:  Less than 10 mL.   PUS D: 04/04/2023 12:58:19 pm T: 04/04/2023 1:55:00 pm  JOB: [id number]/ [id number]

## 2023-04-05 ENCOUNTER — Encounter (HOSPITAL_COMMUNITY): Payer: Self-pay | Admitting: Specialist

## 2023-04-05 NOTE — Anesthesia Postprocedure Evaluation (Signed)
Anesthesia Post Note  Patient: [name]  Procedure(s) Performed: Right knee arthroscopy, partial medial and lateral meniscectomy and debridement (Right: Knee)     Patient location during evaluation: PACU Anesthesia Type: General Level of consciousness: awake and alert Pain management: pain level controlled Vital Signs Assessment: post-procedure vital signs reviewed and stable Respiratory status: spontaneous breathing, nonlabored ventilation and respiratory function stable Cardiovascular status: blood pressure returned to baseline and stable Postop Assessment: no apparent nausea or vomiting Anesthetic complications: no   No notable events documented.  Last Vitals:  Vitals:   04/04/23 1402 04/04/23 1406  BP:  124/68  Pulse:  83  Resp:    Temp: 36.7 C   SpO2:  94%    Last Pain:  Vitals:   04/04/23 1402  TempSrc: Temporal  PainSc:                  Lowella Curb

## 2023-04-18 NOTE — Telephone Encounter (Signed)
There can be many reasons for this including very "normal" ones like delayed grief reactions.  I will need to see her to sort it out but it may be resolved by then.  Don't change any meds as this could worsen her mood.

## 2023-04-18 NOTE — Telephone Encounter (Signed)
Please put patient on cancellation list for Dr. Jennelle Human.

## 2023-04-18 NOTE — Telephone Encounter (Signed)
Patient notified. Will put on cancellation list.

## 2023-04-23 NOTE — Telephone Encounter (Signed)
She is on the wait list

## 2023-04-28 ENCOUNTER — Other Ambulatory Visit: Payer: Self-pay | Admitting: Psychiatry

## 2023-04-28 DIAGNOSIS — F4001 Agoraphobia with panic disorder: Secondary | ICD-10-CM

## 2023-04-28 DIAGNOSIS — F3162 Bipolar disorder, current episode mixed, moderate: Secondary | ICD-10-CM

## 2023-04-28 DIAGNOSIS — F4310 Post-traumatic stress disorder, unspecified: Secondary | ICD-10-CM

## 2023-04-28 DIAGNOSIS — F411 Generalized anxiety disorder: Secondary | ICD-10-CM

## 2023-04-30 ENCOUNTER — Encounter: Payer: Self-pay | Admitting: Psychiatry

## 2023-04-30 ENCOUNTER — Ambulatory Visit (INDEPENDENT_AMBULATORY_CARE_PROVIDER_SITE_OTHER): Payer: Medicare HMO | Admitting: Psychiatry

## 2023-04-30 DIAGNOSIS — F4001 Agoraphobia with panic disorder: Secondary | ICD-10-CM

## 2023-04-30 DIAGNOSIS — F4310 Post-traumatic stress disorder, unspecified: Secondary | ICD-10-CM

## 2023-04-30 DIAGNOSIS — F411 Generalized anxiety disorder: Secondary | ICD-10-CM

## 2023-04-30 DIAGNOSIS — F3162 Bipolar disorder, current episode mixed, moderate: Secondary | ICD-10-CM | POA: Diagnosis not present
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
## 2023-05-04 DIAGNOSIS — R399 Unspecified symptoms and signs involving the genitourinary system: Secondary | ICD-10-CM | POA: Diagnosis not present

## 2023-05-04 DIAGNOSIS — N390 Urinary tract infection, site not specified: Secondary | ICD-10-CM | POA: Diagnosis not present

## 2023-05-08 DIAGNOSIS — M791 Myalgia, unspecified site: Secondary | ICD-10-CM | POA: Diagnosis not present

## 2023-05-08 DIAGNOSIS — G518 Other disorders of facial nerve: Secondary | ICD-10-CM | POA: Diagnosis not present

## 2023-05-08 DIAGNOSIS — M542 Cervicalgia: Secondary | ICD-10-CM | POA: Diagnosis not present

## 2023-05-08 DIAGNOSIS — R519 Headache, unspecified: Secondary | ICD-10-CM | POA: Diagnosis not present

## 2023-05-16 ENCOUNTER — Ambulatory Visit (HOSPITAL_COMMUNITY): Payer: Medicare HMO | Attending: Internal Medicine

## 2023-05-16 DIAGNOSIS — I350 Nonrheumatic aortic (valve) stenosis: Secondary | ICD-10-CM | POA: Diagnosis not present

## 2023-05-16 DIAGNOSIS — Q230 Congenital stenosis of aortic valve: Secondary | ICD-10-CM | POA: Diagnosis not present

## 2023-05-16 DIAGNOSIS — Q231 Congenital insufficiency of aortic valve: Secondary | ICD-10-CM | POA: Diagnosis not present

## 2023-05-16 LAB — ECHOCARDIOGRAM COMPLETE
AR max vel: 0.85 cm2
AV Area VTI: 0.79 cm2
AV Area mean vel: 0.88 cm2
AV Mean grad: 23 mmHg
AV Peak grad: 39.1 mmHg
Ao pk vel: 3.13 m/s
Area-P 1/2: 2.55 cm2
S' Lateral: 2.6 cm

## 2023-05-21 ENCOUNTER — Other Ambulatory Visit: Payer: Self-pay | Admitting: Psychiatry

## 2023-05-21 DIAGNOSIS — F411 Generalized anxiety disorder: Secondary | ICD-10-CM

## 2023-05-21 DIAGNOSIS — F4001 Agoraphobia with panic disorder: Secondary | ICD-10-CM

## 2023-05-21 DIAGNOSIS — F3162 Bipolar disorder, current episode mixed, moderate: Secondary | ICD-10-CM

## 2023-05-21 DIAGNOSIS — F4310 Post-traumatic stress disorder, unspecified: Secondary | ICD-10-CM

## 2023-05-24 DIAGNOSIS — J441 Chronic obstructive pulmonary disease with (acute) exacerbation: Secondary | ICD-10-CM | POA: Diagnosis not present

## 2023-05-24 DIAGNOSIS — F17200 Nicotine dependence, unspecified, uncomplicated: Secondary | ICD-10-CM | POA: Diagnosis not present

## 2023-05-24 DIAGNOSIS — J0110 Acute frontal sinusitis, unspecified: Secondary | ICD-10-CM | POA: Diagnosis not present

## 2023-05-30 ENCOUNTER — Ambulatory Visit: Payer: Medicare HMO | Admitting: Internal Medicine

## 2023-06-04 ENCOUNTER — Telehealth: Payer: Self-pay | Admitting: Psychiatry

## 2023-06-04 DIAGNOSIS — M542 Cervicalgia: Secondary | ICD-10-CM | POA: Diagnosis not present

## 2023-06-04 DIAGNOSIS — G518 Other disorders of facial nerve: Secondary | ICD-10-CM | POA: Diagnosis not present

## 2023-06-04 DIAGNOSIS — R519 Headache, unspecified: Secondary | ICD-10-CM | POA: Diagnosis not present

## 2023-06-04 DIAGNOSIS — M791 Myalgia, unspecified site: Secondary | ICD-10-CM | POA: Diagnosis not present

## 2023-06-04 NOTE — Telephone Encounter (Signed)
Pt called at 11am. She has been having panic attacks lately. She has never had panic attacks like this before and not sure why, very intense. Of note, she was on prednisone for three days  and amoxicillin for upper respiratory infection. Noticed it since then. She is taking 1/2 Xanax tid  to calm down.

## 2023-06-04 NOTE — Telephone Encounter (Signed)
Prednisone can cause panic and mania in susceptible patients.  Usually it will get better a few days to 10 days off the prednisone.  In the mean time she can increase alprazolam temporarily to 0.5 mg TID-QID if needed for a few days.  Be careful about sleepiness.

## 2023-06-04 NOTE — Telephone Encounter (Signed)
Called pt to discuss Dr. Alwyn Ren note; pt was unavailable. LM with female to have Lizzy call back.

## 2023-06-04 NOTE — Telephone Encounter (Signed)
LVM to RC 

## 2023-06-04 NOTE — Telephone Encounter (Signed)
Patient was on a prednisone taper and amoxicillin for URI.  She finished the amoxicillin on 9/12 and stopped the taper with 2 days left of the taper on 9/12 as well due to the anxiety. She was reporting severe panic attacks, said her mind was racing. She was irritable. She also reported sweating. She s/p mastectomy and reports she had her sweat glands removed  (? lymph nodes). She said she is better 4 days after the prednisone, but is still anxious. She is taking 0.25 mg of Xanax PRN to deal with the anxiety.   No other medication changes have been made.  Alprazolam 0.5 mg QID, said she usually cuts tablet in half, except takes 1 mg at bedtime. Celexa 20 mg every day Clonidine 0.1 mg - 1 AM, 2 PM Lurasidone 120 mg  Topamax 150 mg

## 2023-06-05 NOTE — Telephone Encounter (Signed)
Pls call her next week and make sure manic sx resolved

## 2023-06-05 NOTE — Telephone Encounter (Signed)
Patient notified

## 2023-06-11 NOTE — Telephone Encounter (Signed)
Called patient to FU on her mania after taking prednisone. She said she is much better, not needing extra Xanax now, just her regular amount. She wanted to thank you for caring to FU with her.

## 2023-06-19 DIAGNOSIS — E538 Deficiency of other specified B group vitamins: Secondary | ICD-10-CM | POA: Diagnosis not present

## 2023-06-19 DIAGNOSIS — E559 Vitamin D deficiency, unspecified: Secondary | ICD-10-CM | POA: Diagnosis not present

## 2023-06-19 DIAGNOSIS — E785 Hyperlipidemia, unspecified: Secondary | ICD-10-CM | POA: Diagnosis not present

## 2023-06-19 DIAGNOSIS — Z79899 Other long term (current) drug therapy: Secondary | ICD-10-CM | POA: Diagnosis not present

## 2023-06-21 ENCOUNTER — Telehealth: Payer: Self-pay | Admitting: Pulmonary Disease

## 2023-06-21 NOTE — Telephone Encounter (Signed)
Pt calling in bc she is having a bad cough, she's coughing so hard that it makes her urinate on herself.

## 2023-06-23 ENCOUNTER — Other Ambulatory Visit: Payer: Self-pay | Admitting: Psychiatry

## 2023-06-23 DIAGNOSIS — F3181 Bipolar II disorder: Secondary | ICD-10-CM

## 2023-06-23 DIAGNOSIS — F4310 Post-traumatic stress disorder, unspecified: Secondary | ICD-10-CM

## 2023-06-23 DIAGNOSIS — F411 Generalized anxiety disorder: Secondary | ICD-10-CM

## 2023-06-23 DIAGNOSIS — F4001 Agoraphobia with panic disorder: Secondary | ICD-10-CM

## 2023-06-25 MED ORDER — AZITHROMYCIN 250 MG PO TABS: ORAL_TABLET | ORAL | 0 refills | Status: DC

## 2023-06-25 NOTE — Telephone Encounter (Signed)
Called and spoke to patient.  She reports of prod cough with clear sputum, wheezing and increased SOB. She stated that she had a recent URI and she feels that it is lingering. Sx have been present for 3 weeks. Denied f/c/s or additional sx. Negative covid test last week. She stated that cough is so forceful that she is urinating on herself.  She is using albuterol HFA QID and trelegy once daily.  She does not wear supplemental oxygen. Spo2 maintaining around 97%. Completed course of prednisone 3 weeks. She stated that was unable to tolerate prednisone, as it caused her to be manic.

## 2023-06-25 NOTE — Telephone Encounter (Signed)
Patient is aware of below message/recommendations and voiced her understanding. She will keep scheduled appt.  Nothing further needed.

## 2023-06-25 NOTE — Telephone Encounter (Signed)
I have never met this patient. Based on description of symptoms sounds like bronchitis. Was given amoxicillin by PCP about 1 month ago. Often steroids are the best thing for this and due to her inability to tolerate steroids we may struggle to make things better.  Will send a Z-Pak into pharmacy.  Continue all inhaler medicines as she is.  She needs to keep her follow-up appointment with me 06/27/2023 to establish care.

## 2023-06-27 ENCOUNTER — Ambulatory Visit: Payer: Medicare HMO | Admitting: Pulmonary Disease

## 2023-06-27 ENCOUNTER — Encounter: Payer: Self-pay | Admitting: Pulmonary Disease

## 2023-06-27 VITALS — BP 107/69 | HR 77 | Temp 98.2°F | Ht 67.0 in | Wt 111.2 lb

## 2023-06-27 DIAGNOSIS — Z716 Tobacco abuse counseling: Secondary | ICD-10-CM

## 2023-06-27 DIAGNOSIS — J449 Chronic obstructive pulmonary disease, unspecified: Secondary | ICD-10-CM

## 2023-06-27 MED ORDER — BREZTRI AEROSPHERE 160-9-4.8 MCG/ACT IN AERO: 2.0000 | INHALATION_SPRAY | Freq: Two times a day (BID) | RESPIRATORY_TRACT | 11 refills | Status: AC

## 2023-06-27 MED ORDER — AZITHROMYCIN 250 MG PO TABS: 250.0000 mg | ORAL_TABLET | ORAL | 6 refills | Status: DC

## 2023-06-27 NOTE — Patient Instructions (Signed)
It is nice to meet you  [name] your current course of azithromycin  Starting next Monday take azithromycin 1 tablet on Monday 1 tablet on Wednesday 1 tablet on Friday.  Do this every week indefinitely.  1 tablet Monday Wednesday Friday.  I recommend we stick with Breztri.  2 puffs in the morning 2 puffs in the evening.  Rinse her mouth out with water after every use then brush your teeth and rinse your mouth out again.  Return to clinic in 3 months or sooner as needed with Dr. Judeth Horn

## 2023-06-27 NOTE — Progress Notes (Signed)
@Patient  ID: [name], female    DOB: [birth year], 67 y.o.   MRN: [id number]  Chief Complaint  Patient presents with   Follow-up    Former Dr. Kendrick Fries pt. She states that she has had some increased SOB, cough and wheezing off and on since Sept. 2024. Cough is currently prod with clear sputum.  She was tx with pred, amoxicillin and then zpack. She had manic episode and increased anxiety on pred and does not wish to take that any longer.     Referring provider: Laurann Montana, MD  HPI:   67 y.o. woman with history of COPD who were seen for evaluation of the same.  Most recent PCP note reviewed.  Multiple prior pulmonary notes reviewed.  Former patient of Dr. Kendrick Fries.  She has history of COPD and dyspnea.  Unfortunate the last month or so a lot of cough.  Productive of sputum.  Near posttussive emesis at times.  05/2023 was given amoxicillin and prednisone via PCP.  No real help.  Unfortunately she developed a weeklong manic episode with a lot of anxiety and panic attacks.  Likely related to steroids.  Unfortunately shortly thereafter she got a little bit worse in the separate viral URI symptoms.  Cough or since then.  I sent a Z-Pak and a couple days ago.  Some mild improvement since then.  She was on Trelegy before now using Breztri as she is been out of Trelegy.  The Markus Daft is more beneficial in terms of symptoms but causes thrush while she was switched to Trelegy in the past.  She is a heavy smoker in the past and recently is interested in giving up smoking, wants to quit.  Reviewed most recent chest x-ray 02/2023 on my review interpretation reveals hyperinflation on the PA and lateral film otherwise clear.  Most recent CT chest 10/2022 reviewed and interpreted as notable emphysematous changes, no other significant lung findings.  Questionaires / Pulmonary Flowsheets:   ACT:      No data to display          MMRC:     No data to display          Epworth:      No data to  display          Tests:   FENO:  No results found for: "NITRICOXIDE"  PFT:     No data to display          WALK:      No data to display          Imaging: Personally reviewed and as per EMR and discussion in this note No results found.  Lab Results: Personally reviewed CBC    Component Value Date/Time   WBC 5.9 03/27/2023 1325   RBC 4.53 03/27/2023 1325   HGB 14.0 03/27/2023 1325   HCT 42.7 03/27/2023 1325   PLT 243 03/27/2023 1325   MCV 94.3 03/27/2023 1325   MCH 30.9 03/27/2023 1325   MCHC 32.8 03/27/2023 1325   RDW 13.2 03/27/2023 1325   LYMPHSABS 2.5 07/08/2021 1705   MONOABS 0.7 07/08/2021 1705   EOSABS 0.1 07/08/2021 1705   BASOSABS 0.1 07/08/2021 1705    BMET    Component Value Date/Time   NA 133 (L) 03/27/2023 1325   NA 136 10/14/2019 1042   K 4.3 03/27/2023 1325   CL 105 03/27/2023 1325   CO2 21 (L) 03/27/2023 1325   GLUCOSE 101 (H) 03/27/2023 1325   BUN  9 03/27/2023 1325   BUN 11 10/14/2019 1042   CREATININE 0.67 03/27/2023 1325   CALCIUM 8.3 (L) 03/27/2023 1325   GFRNONAA >60 03/27/2023 1325   GFRAA 94 10/14/2019 1042    BNP No results found for: "BNP"  ProBNP No results found for: "PROBNP"  Specialty Problems       Pulmonary Problems   Allergic rhinitis due to pollen   Chronic obstructive pulmonary disease (HCC)    Allergies  Allergen Reactions   Alendronate Sodium Shortness Of Breath   Fiorinal [Butalbital-Aspirin-Caffeine] Hives and Swelling    SWELLING OF LIPS AND HIVES   Hydrocodone Bit-Homatrop Mbr     Other reaction(s): nausea   Iodinated Contrast Media Hives   Iohexol      Code: HIVES, Desc: omnipaque 300-doc'd by KB on 04-13-06.Marland KitchenMarland Kitchenpt states she gets severe hives    Klonopin [Clonazepam]     PT STATES SHE OVERDOSED ON KLONOPIN   Levothyroxine Hives    Other reaction(s): hives with the generic   Other     THE PURPLE DYE IN GENERIC SYNTHROID ( LEVOTHYROXINE ) CAUSED BREAKING OUT FACE AND SCALP.  PT CAN  TAKE SYNTHROID.   Strawberry (Diagnostic)     Other reaction(s): canker sores in mouth   Tramadol Other (See Comments)    depression   Tramadol-Acetaminophen     Other reaction(s): depressed   Dexilant [Dexlansoprazole] Diarrhea    Immunization History  Administered Date(s) Administered   DTaP / Hep B / IPV 06/04/2017, 12/03/2017, 06/06/2018, 12/06/2018, 06/09/2019, 12/08/2019, 06/10/2020, 12/09/2020, 06/15/2021   Fluad Quad(high Dose 65+) 07/31/2022   Influenza Inj Mdck Quad Pf 05/29/2018   Influenza Split 07/07/2008, 06/30/2012, 07/04/2017   Influenza, High Dose Seasonal PF 05/30/2021, 06/19/2023   Influenza,inj,Quad PF,6+ Mos 06/15/2016, 05/29/2018, 05/20/2019   PFIZER(Purple Top)SARS-COV-2 Vaccination 12/27/2019, 01/20/2020, 08/29/2020   Pneumococcal Conjugate-13 05/27/2020   Pneumococcal Polysaccharide-23 08/30/2012, 05/30/2021   Respiratory Syncytial Virus Vaccine,Recomb Aduvanted(Arexvy) 08/18/2022   Tdap 08/30/2012   Zoster, Live 03/15/2016    Past Medical History:  Diagnosis Date   Bipolar disorder (HCC)    PT ON TOPIRAMATE AND ZANAX- SEES PSYCHIATRIST DR. COTTLE - PT STATES DOING WELL ON MEDICATIONS   COPD (chronic obstructive pulmonary disease) (HCC)    OCCAS USE OF INHALER-QUIT SMOKING 2014 - USING E- [employer]   Depression    Diverticulitis    GERD (gastroesophageal reflux disease)    H/O suicide attempt 09/18/2005   PT'S SON HAD COMMITTED SUICIDE AND SHE WAS OVER COME WITH GRIEF   Hypothyroidism    IBS (irritable bowel syndrome)    Lichen simplex chronicus    MVP (mitral valve prolapse)    DOES NOT CAUSE ANY PROBLEMS   Osteoporosis    Pain    RIGHT SHOULDER AND LIMITED ROM - -RT SHOULDER IMPINGEMENT, ADHESIVE CAPSULITIS, ROTATOR CUFF TEAR   Pain    NECK PAIN -PT HOPING SURGERY ON HER SHOULDER HELPS HER NECK PAIN- DOES HAVE HX OF CERBVICAL DISK FUSION.   Pain    LUMBAR PAIN - DDD AND PREVIOUS LUMBAR SURGERY   Vitamin D deficiency     Tobacco  History: Social History   Tobacco Use  Smoking Status Every Day   Current packs/day: 1.00   Average packs/day: 1 pack/day for 55.0 years (55.0 ttl pk-yrs)   Types: Cigarettes  Smokeless Tobacco Current  Tobacco Comments   Smoking 1/2 ppd.  Trying to cut back.  Trying to find vape flavor she likes.  08/30/2022 hfb   Ready to quit:  Not Answered Counseling given: Not Answered Tobacco comments: Smoking 1/2 ppd.  Trying to cut back.  Trying to find vape flavor she likes.  08/30/2022 hfb   Continue to not smoke  Outpatient Encounter Medications as of 06/27/2023  Medication Sig   albuterol (PROVENTIL HFA;VENTOLIN HFA) 108 (90 BASE) MCG/ACT inhaler Inhale 1 puff into the lungs every 6 (six) hours as needed for wheezing or shortness of breath.   ALPRAZolam (XANAX) 0.5 MG tablet TAKE 1 TABLET BY MOUTH IN THE MORNING, AT NOON IN THE EVENING AND AT BEDTIME   aspirin EC 81 MG tablet Take 1 tablet (81 mg total) by mouth daily. Swallow whole.   atorvastatin (LIPITOR) 80 MG tablet Take 1 tablet (80 mg total) by mouth daily.   azithromycin (ZITHROMAX) 250 MG tablet Take 1 tablet (250 mg total) by mouth 3 (three) times a week. 1 tab on Monday, Wednesday, Friday   Biotin 09811 MCG TABS Take 10,000 mcg by mouth daily.   Cholecalciferol (VITAMIN D3) 50 MCG (2000 UT) capsule Take 2,000 Units by mouth daily.   citalopram (CELEXA) 20 MG tablet Take 1 tablet (20 mg total) by mouth daily.   cloNIDine (CATAPRES) 0.1 MG tablet TAKE 1 IN THE MORNING AND 2 TABLETS AT NIGHT   clotrimazole (MYCELEX) 10 MG troche Take 1 tablet (10 mg total) by mouth 5 (five) times daily.   cyanocobalamin (VITAMIN B12) 1000 MCG tablet Take 1,000 mcg by mouth daily.   denosumab (PROLIA) 60 MG/ML SOLN injection Inject 60 mg into the skin every 6 (six) months. Administer in upper arm, thigh, or abdomen   docusate sodium (COLACE) 100 MG capsule Take 1 capsule (100 mg total) by mouth 2 (two) times daily as needed for mild constipation.    fluticasone (FLONASE) 50 MCG/ACT nasal spray Place 2 sprays into both nostrils daily as needed for allergies.   ipratropium (ATROVENT) 0.06 % nasal spray Place 2 sprays into both nostrils daily as needed for rhinitis.   lamoTRIgine (LAMICTAL) 25 MG tablet Take 50 mg by mouth 2 (two) times daily.   Lurasidone HCl 120 MG TABS Take 1 tablet (120 mg total) by mouth daily.   omeprazole (PRILOSEC) 40 MG capsule Take 40 mg by mouth daily.   SYNTHROID 88 MCG tablet Take 88 mcg by mouth daily.   topiramate (TOPAMAX) 100 MG tablet TAKE 1 AND 1/2 TABLETS BY MOUTH DAILY   [DISCONTINUED] Budeson-Glycopyrrol-Formoterol (BREZTRI AEROSPHERE) 160-9-4.8 MCG/ACT AERO Inhale 2 puffs into the lungs in the morning and at bedtime.   Budeson-Glycopyrrol-Formoterol (BREZTRI AEROSPHERE) 160-9-4.8 MCG/ACT AERO Inhale 2 puffs into the lungs in the morning and at bedtime.   diphenhydrAMINE (BENADRYL) 50 MG tablet Take 1 tablet (50 mg total) by mouth once for 1 dose. Pt to take 50 mg of benadryl on 08/03/21 at 10:40 am. Please call [phone] with any questions. (Patient not taking: Reported on 03/23/2023)   [DISCONTINUED] azithromycin (ZITHROMAX) 250 MG tablet Take 2 tablets (500 mg total) by mouth daily for 1 day, THEN 1 tablet (250 mg total) daily for 4 days.   [DISCONTINUED] Fluticasone-Umeclidin-Vilant (TRELEGY ELLIPTA) 100-62.5-25 MCG/ACT AEPB Inhale 1 puff into the lungs daily. (Patient not taking: Reported on 06/27/2023)   [DISCONTINUED] oxyCODONE (OXY IR/ROXICODONE) 5 MG immediate release tablet Take 1 tablet (5 mg total) by mouth every 4 (four) hours as needed for severe pain.   No facility-administered encounter medications on file as of 06/27/2023.     Review of Systems  Review of Systems  No chest pain  exertion.  No orthopnea or PND.  Comprehensive review of systems otherwise negative. Physical Exam  BP 107/69 (BP Location: Right Arm, Cuff Size: Normal)   Pulse 77   Temp 98.2 F (36.8 C) (Oral)   Ht 5\' 7"   (1.702 m)   Wt 111 lb 3.2 oz (50.4 kg)   SpO2 98% Comment: on RA  BMI 17.42 kg/m   Wt Readings from Last 5 Encounters:  06/27/23 111 lb 3.2 oz (50.4 kg)  04/04/23 115 lb (52.2 kg)  03/27/23 115 lb (52.2 kg)  02/26/23 115 lb 12.8 oz (52.5 kg)  10/04/22 114 lb 3.2 oz (51.8 kg)    BMI Readings from Last 5 Encounters:  06/27/23 17.42 kg/m  04/04/23 18.01 kg/m  03/27/23 18.01 kg/m  02/26/23 18.14 kg/m  10/04/22 17.89 kg/m     Physical Exam General: Sitting in chair, frail appearing Eyes: EOMI, no icterus Neck: Supple, no JVP Pulmonary: Distant, clear, no work of breathing Cardiovascular: Warm, no edema Abdomen: Nondistended, bowel sounds present MSK: No synovitis, no joint effusion Neuro: Normal gait, no weakness Psych: Normal mood, full affect   Assessment & Plan:   COPD with ongoing exacerbation: Few weeks of ongoing bronchitis symptoms.  Likely triggered by URI viral.  Exam is clear.  A lot of cough.  Manic episode with prednisone avoiding prednisone although I think unfortunately that would be the most beneficial thing for her from a lung standpoint.  Recent Z-Pak with mild improvement.  Overall symptoms mildly improved on Breztri compared to Trelegy.  Instructed to stick with Breztri.  Has issues with thrush but we can deal with it.  After finishing Z-Pak will start azithromycin Monday Wednesday Friday for anti-inflammatory properties in an effort to prevent exacerbations.  Smoking assessment and cessation counseling I have advised the patient to quit/stop smoking as soon as possible due to high risk for multiple medical problems.  It will also be very difficult for [place] to manage patient's  respiratory symptoms and status if we continue to expose her lungs to a known irritant. Patient is willing to quit smoking. I have advised the patient that we can assist and have options of nicotine replacement therapy, provided smoking cessation education today, provided smoking  cessation counseling, and provided cessation resources. Follow-up next office visit office visit for assessment of smoking cessation.  I spent 3 minutes in smoking cessation counseling.   Return in about 3 months (around 09/27/2023) for f/u Dr. Judeth Horn.   Karren Burly, MD 06/27/2023   This appointment required 45 minutes of patient care (this includes precharting, chart review, review of results, face-to-face care, etc.).

## 2023-07-04 ENCOUNTER — Encounter: Payer: Self-pay | Admitting: Psychiatry

## 2023-07-04 ENCOUNTER — Ambulatory Visit: Payer: Medicare HMO | Admitting: Psychiatry

## 2023-07-04 DIAGNOSIS — F4001 Agoraphobia with panic disorder: Secondary | ICD-10-CM

## 2023-07-04 DIAGNOSIS — F411 Generalized anxiety disorder: Secondary | ICD-10-CM | POA: Diagnosis not present

## 2023-07-04 DIAGNOSIS — F3181 Bipolar II disorder: Secondary | ICD-10-CM | POA: Diagnosis not present

## 2023-07-04 DIAGNOSIS — F4310 Post-traumatic stress disorder, unspecified: Secondary | ICD-10-CM

## 2023-07-04 DIAGNOSIS — F3162 Bipolar disorder, current episode mixed, moderate: Secondary | ICD-10-CM

## 2023-07-04 MED ORDER — ALPRAZOLAM 0.5 MG PO TABS
ORAL_TABLET | ORAL | 5 refills | Status: DC
Start: 2023-07-04 — End: 2024-02-01

## 2023-07-04 MED ORDER — TOPIRAMATE 100 MG PO TABS
ORAL_TABLET | ORAL | 1 refills | Status: DC
Start: 2023-07-04 — End: 2024-02-27

## 2023-07-04 MED ORDER — LURASIDONE HCL 120 MG PO TABS
120.0000 mg | ORAL_TABLET | Freq: Every day | ORAL | 5 refills | Status: DC
Start: 2023-07-04 — End: 2024-02-27
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
## 2023-07-23 DIAGNOSIS — M810 Age-related osteoporosis without current pathological fracture: Secondary | ICD-10-CM | POA: Diagnosis not present

## 2023-07-26 DIAGNOSIS — H43393 Other vitreous opacities, bilateral: Secondary | ICD-10-CM | POA: Diagnosis not present

## 2023-07-26 DIAGNOSIS — Z961 Presence of intraocular lens: Secondary | ICD-10-CM | POA: Diagnosis not present

## 2023-08-14 DIAGNOSIS — R21 Rash and other nonspecific skin eruption: Secondary | ICD-10-CM | POA: Diagnosis not present

## 2023-09-26 ENCOUNTER — Ambulatory Visit: Payer: Medicare HMO | Admitting: Pulmonary Disease

## 2023-10-04 ENCOUNTER — Other Ambulatory Visit: Payer: Self-pay | Admitting: Psychiatry

## 2023-10-04 DIAGNOSIS — F4001 Agoraphobia with panic disorder: Secondary | ICD-10-CM

## 2023-10-04 DIAGNOSIS — F4310 Post-traumatic stress disorder, unspecified: Secondary | ICD-10-CM

## 2023-10-04 DIAGNOSIS — F411 Generalized anxiety disorder: Secondary | ICD-10-CM

## 2023-10-08 ENCOUNTER — Ambulatory Visit: Payer: Medicare HMO | Admitting: Internal Medicine

## 2023-10-29 ENCOUNTER — Ambulatory Visit (HOSPITAL_BASED_OUTPATIENT_CLINIC_OR_DEPARTMENT_OTHER)
Admission: RE | Admit: 2023-10-29 | Discharge: 2023-10-29 | Disposition: A | Discharge status: Home / Self Care | Payer: Medicare HMO | Source: Ambulatory Visit | Attending: Family Medicine | Admitting: Family Medicine

## 2023-10-29 DIAGNOSIS — F17210 Nicotine dependence, cigarettes, uncomplicated: Secondary | ICD-10-CM | POA: Insufficient documentation

## 2023-10-29 DIAGNOSIS — Z87891 Personal history of nicotine dependence: Secondary | ICD-10-CM | POA: Diagnosis not present

## 2023-10-29 DIAGNOSIS — Z122 Encounter for screening for malignant neoplasm of respiratory organs: Secondary | ICD-10-CM | POA: Insufficient documentation

## 2023-11-12 ENCOUNTER — Other Ambulatory Visit: Payer: Self-pay

## 2023-11-12 DIAGNOSIS — Z87891 Personal history of nicotine dependence: Secondary | ICD-10-CM

## 2023-11-12 DIAGNOSIS — F17210 Nicotine dependence, cigarettes, uncomplicated: Secondary | ICD-10-CM

## 2023-11-12 DIAGNOSIS — Z122 Encounter for screening for malignant neoplasm of respiratory organs: Secondary | ICD-10-CM

## 2023-11-26 ENCOUNTER — Ambulatory Visit: Admitting: Psychiatry

## 2023-11-26 ENCOUNTER — Encounter: Payer: Self-pay | Admitting: Psychiatry

## 2023-11-26 DIAGNOSIS — F3162 Bipolar disorder, current episode mixed, moderate: Secondary | ICD-10-CM | POA: Diagnosis not present

## 2023-11-26 DIAGNOSIS — F4001 Agoraphobia with panic disorder: Secondary | ICD-10-CM | POA: Diagnosis not present

## 2023-11-26 DIAGNOSIS — F411 Generalized anxiety disorder: Secondary | ICD-10-CM

## 2023-11-26 DIAGNOSIS — F4310 Post-traumatic stress disorder, unspecified: Secondary | ICD-10-CM

## 2023-11-26 DIAGNOSIS — F4323 Adjustment disorder with mixed anxiety and depressed mood: Secondary | ICD-10-CM

## 2023-11-26 MED ORDER — CITALOPRAM HYDROBROMIDE 20 MG PO TABS
30.0000 mg | ORAL_TABLET | Freq: Every day | ORAL | 0 refills | Status: DC
Start: 2023-11-26 — End: 2024-01-01
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
## 2023-12-18 ENCOUNTER — Other Ambulatory Visit: Payer: Self-pay | Admitting: Family Medicine

## 2023-12-18 ENCOUNTER — Encounter: Payer: Self-pay | Admitting: Family Medicine

## 2023-12-18 DIAGNOSIS — E039 Hypothyroidism, unspecified: Secondary | ICD-10-CM | POA: Diagnosis not present

## 2023-12-18 DIAGNOSIS — E43 Unspecified severe protein-calorie malnutrition: Secondary | ICD-10-CM | POA: Diagnosis not present

## 2023-12-18 DIAGNOSIS — R197 Diarrhea, unspecified: Secondary | ICD-10-CM | POA: Diagnosis not present

## 2023-12-18 DIAGNOSIS — R634 Abnormal weight loss: Secondary | ICD-10-CM

## 2023-12-20 ENCOUNTER — Telehealth: Payer: Self-pay

## 2023-12-20 MED ORDER — PREDNISONE 50 MG PO TABS
ORAL_TABLET | ORAL | 0 refills | Status: AC
Start: 2023-12-20 — End: ?

## 2023-12-20 MED ORDER — DIPHENHYDRAMINE HCL 50 MG PO TABS
ORAL_TABLET | ORAL | 0 refills | Status: AC
Start: 2023-12-20 — End: ?

## 2023-12-20 NOTE — Progress Notes (Signed)
 Phone call to patient to review instructions for 13 hr prep for CT w/ contrast on 12/21/23 at 12:40. Prescription called into [facility] per request. Pt aware and verbalized understanding of instructions. Prescription: 12/21/23 at 12:40PM- 50mg  Prednisone 12/21/23 at 06:40AM- 50mg  Prednisone 12/21/23 at 12:40PM- 50mg  Prednisone and 50mg  Benadryl

## 2023-12-21 ENCOUNTER — Ambulatory Visit
Admission: RE | Admit: 2023-12-21 | Discharge: 2023-12-21 | Disposition: A | Discharge status: Home / Self Care | Source: Ambulatory Visit | Attending: Family Medicine | Admitting: Family Medicine

## 2023-12-21 DIAGNOSIS — R197 Diarrhea, unspecified: Secondary | ICD-10-CM | POA: Diagnosis not present

## 2023-12-21 DIAGNOSIS — R634 Abnormal weight loss: Secondary | ICD-10-CM

## 2023-12-21 DIAGNOSIS — R109 Unspecified abdominal pain: Secondary | ICD-10-CM | POA: Diagnosis not present

## 2023-12-21 MED ORDER — IOPAMIDOL (ISOVUE-300) INJECTION 61%
100.0000 mL | Freq: Once | INTRAVENOUS | Status: AC | PRN
  Administered 2023-12-21: 100 mL via INTRAVENOUS

## 2023-12-31 ENCOUNTER — Other Ambulatory Visit: Payer: Self-pay | Admitting: Psychiatry

## 2023-12-31 DIAGNOSIS — F4310 Post-traumatic stress disorder, unspecified: Secondary | ICD-10-CM

## 2023-12-31 DIAGNOSIS — F411 Generalized anxiety disorder: Secondary | ICD-10-CM

## 2023-12-31 DIAGNOSIS — F4001 Agoraphobia with panic disorder: Secondary | ICD-10-CM

## 2024-01-01 ENCOUNTER — Ambulatory Visit
Admission: RE | Admit: 2024-01-01 | Discharge: 2024-01-01 | Disposition: A | Discharge status: Home / Self Care | Source: Ambulatory Visit | Attending: Gastroenterology

## 2024-01-01 ENCOUNTER — Other Ambulatory Visit: Payer: Self-pay

## 2024-01-01 DIAGNOSIS — K5900 Constipation, unspecified: Secondary | ICD-10-CM | POA: Diagnosis not present

## 2024-01-01 DIAGNOSIS — R935 Abnormal findings on diagnostic imaging of other abdominal regions, including retroperitoneum: Secondary | ICD-10-CM | POA: Diagnosis not present

## 2024-01-01 DIAGNOSIS — R1310 Dysphagia, unspecified: Secondary | ICD-10-CM | POA: Diagnosis not present

## 2024-01-01 DIAGNOSIS — R197 Diarrhea, unspecified: Secondary | ICD-10-CM | POA: Diagnosis not present

## 2024-01-01 DIAGNOSIS — R109 Unspecified abdominal pain: Secondary | ICD-10-CM | POA: Diagnosis not present

## 2024-01-03 ENCOUNTER — Ambulatory Visit: Payer: Medicare HMO | Admitting: Psychiatry

## 2024-01-08 DIAGNOSIS — Z8249 Family history of ischemic heart disease and other diseases of the circulatory system: Secondary | ICD-10-CM | POA: Diagnosis not present

## 2024-01-08 DIAGNOSIS — G43719 Chronic migraine without aura, intractable, without status migrainosus: Secondary | ICD-10-CM | POA: Diagnosis not present

## 2024-01-09 ENCOUNTER — Telehealth: Payer: Self-pay

## 2024-01-09 NOTE — Telephone Encounter (Signed)
 Patient was seen on 3/10 and told to increase citalopram  from 20 mg to 30 mg. She said she forgot about it and had not increased it.  She is reporting rage, going from 0 to 60 in one second. There had been some family issues that were likely the contributing factor.  She reports she will increase the citalopram  and let us  know if rage does not resolve.

## 2024-01-23 ENCOUNTER — Ambulatory Visit: Admitting: Pulmonary Disease

## 2024-01-23 ENCOUNTER — Encounter: Payer: Self-pay | Admitting: Pulmonary Disease

## 2024-01-23 VITALS — BP 94/62 | HR 88 | Temp 98.0°F | Ht 67.0 in | Wt 101.4 lb

## 2024-01-23 DIAGNOSIS — J449 Chronic obstructive pulmonary disease, unspecified: Secondary | ICD-10-CM | POA: Diagnosis not present

## 2024-01-23 DIAGNOSIS — F17210 Nicotine dependence, cigarettes, uncomplicated: Secondary | ICD-10-CM

## 2024-01-23 MED ORDER — TRELEGY ELLIPTA 100-62.5-25 MCG/ACT IN AEPB: 1.0000 | INHALATION_SPRAY | Freq: Every day | RESPIRATORY_TRACT | Status: AC

## 2024-01-23 NOTE — Patient Instructions (Addendum)
 Nice to see you again  Use Trelegy 1 puff once a day.  Rinse your mouth out with water after every use.  I provided for samples, each sample last 2 weeks.  Once you get down to your last box please call the office and we have samples I am happy to give you more.  The dollar amount the required for patient assistance from Trelegy includes all medications, once you have gotten close to that later in the year we can apply for manufacturing assistance.  Your CT scan earlier in the year looks good, nodules are better, need a repeat scan in 1 year, February 2026.  This is already been ordered.  Return to clinic in 6 months or sooner as needed with Dr. Marygrace Snellen

## 2024-01-23 NOTE — Progress Notes (Signed)
 @Patient  ID: [name], female    DOB: Nov 27, 1955, 68 y.o.   MRN: [id number]  Chief Complaint  Patient presents with   Follow-up    Seasonal allergies causing SOB.    Referring provider: Victorio Grave, MD  HPI:   68 y.o. woman with history of COPD who were seen for evaluation of the same.  Most recent PCP note reviewed.  Multiple prior pulmonary notes reviewed.  Former patient of Dr. McQuaid.  Overall doing okay.  Unable to afford inhalers.  Sounds like would qualify for manufacturing assistance but the $600 out-of-pocket expense prior to the manufacturing kick in and this too much.  No real solution for this.  Hopefully over time she will accrue this amount we can get her many fraction assistance.  For now we will continue with samples.  She had a CT scan interim, low-dose CT scan.  Lung RADS 1, 1 year follow-up recommended.  Discussed in detail.  HPI at initial visit: She has history of COPD and dyspnea.  Unfortunate the last month or so a lot of cough.  Productive of sputum.  Near posttussive emesis at times.  05/2023 was given amoxicillin and prednisone  via PCP.  No real help.  Unfortunately she developed a weeklong manic episode with a lot of anxiety and panic attacks.  Likely related to steroids.  Unfortunately shortly thereafter she got a little bit worse in the separate viral URI symptoms.  Cough or since then.  I sent a Z-Pak and a couple days ago.  Some mild improvement since then.  She was on Trelegy before now using Breztri  as she is been out of Trelegy.  The Breztri  is more beneficial in terms of symptoms but causes thrush while she was switched to Trelegy in the past.  She is a heavy smoker in the past and recently is interested in giving up smoking, wants to quit.  Reviewed most recent chest x-ray 02/2023 on my review interpretation reveals hyperinflation on the PA and lateral film otherwise clear.  Most recent CT chest 10/2022 reviewed and interpreted as notable emphysematous  changes, no other significant lung findings.  Questionaires / Pulmonary Flowsheets:   ACT:      No data to display          MMRC:     No data to display          Epworth:      No data to display          Tests:   FENO:  No results found for: "NITRICOXIDE"  PFT:     No data to display          WALK:      No data to display          Imaging: Personally reviewed and as per EMR and discussion in this note DG Abd 1 View Result Date: 01/11/2024 CLINICAL DATA:  Abdominal pain. Colonic constipation intermittently with diarrhea for 1 month. EXAM: ABDOMEN - 1 VIEW COMPARISON:  Abdominopelvic CT 12/21/2023. FINDINGS: Two supine views of the abdomen demonstrated normal nonobstructive bowel gas pattern. Colonic stool burden within physiologic limits. No supine evidence of pneumoperitoneum or bowel wall thickening. No suspicious abdominal calcifications. Grossly stable lumbar spine degenerative changes without acute osseous abnormality. IMPRESSION: No acute abdominal findings. Colonic stool burden within physiologic limits. Electronically Signed   By: Elmon Hagedorn M.D.   On: 01/11/2024 13:17    Lab Results: Personally reviewed CBC    Component Value Date/Time  WBC 5.9 03/27/2023 1325   RBC 4.53 03/27/2023 1325   HGB 14.0 03/27/2023 1325   HCT 42.7 03/27/2023 1325   PLT 243 03/27/2023 1325   MCV 94.3 03/27/2023 1325   MCH 30.9 03/27/2023 1325   MCHC 32.8 03/27/2023 1325   RDW 13.2 03/27/2023 1325   LYMPHSABS 2.5 07/08/2021 1705   MONOABS 0.7 07/08/2021 1705   EOSABS 0.1 07/08/2021 1705   BASOSABS 0.1 07/08/2021 1705    BMET    Component Value Date/Time   NA 133 (L) 03/27/2023 1325   NA 136 10/14/2019 1042   K 4.3 03/27/2023 1325   CL 105 03/27/2023 1325   CO2 21 (L) 03/27/2023 1325   GLUCOSE 101 (H) 03/27/2023 1325   BUN 9 03/27/2023 1325   BUN 11 10/14/2019 1042   CREATININE 0.67 03/27/2023 1325   CALCIUM  8.3 (L) 03/27/2023 1325   GFRNONAA  >60 03/27/2023 1325   GFRAA 94 10/14/2019 1042    BNP No results found for: "BNP"  ProBNP No results found for: "PROBNP"  Specialty Problems       Pulmonary Problems   Allergic rhinitis due to pollen   Chronic obstructive pulmonary disease (HCC)    Allergies  Allergen Reactions   Alendronate Sodium Shortness Of Breath   Fiorinal [Butalbital-Aspirin -Caffeine] Hives and Swelling    SWELLING OF LIPS AND HIVES   Hydrocodone Bit-Homatrop Mbr     Other reaction(s): nausea   Iodinated Contrast Media Hives   Iohexol       Code: HIVES, Desc: omnipaque  300-doc'd by KB on 04-13-06.[facility]aron [facility]as[facility] A[facility] states she gets severe hives    Klonopin [Clonazepam]     PT STATES SHE OVERDOSED ON KLONOPIN   Levothyroxine Hives    Other reaction(s): hives with the generic   Other     THE PURPLE DYE IN GENERIC SYNTHROID ( LEVOTHYROXINE ) CAUSED BREAKING OUT FACE AND SCALP.  PT CAN TAKE SYNTHROID.   Strawberry (Diagnostic)     Other reaction(s): canker sores in mouth   Tramadol Other (See Comments)    depression   Tramadol-Acetaminophen      Other reaction(s): depressed   Dexilant [Dexlansoprazole] Diarrhea    Immunization History  Administered Date(s) Administered   DTaP / Hep B / IPV 06/04/2017, 12/03/2017, 06/06/2018, 12/06/2018, 06/09/2019, 12/08/2019, 06/10/2020, 12/09/2020, 06/15/2021   Fluad Quad(high Dose 65+) 07/31/2022   Influenza Inj Mdck Quad Pf 05/29/2018   Influenza Split 07/07/2008, 06/30/2012, 07/04/2017   Influenza, High Dose Seasonal PF 05/30/2021, 06/19/2023   Influenza,inj,Quad PF,6+ Mos 06/15/2016, 05/29/2018, 05/20/2019   PFIZER(Purple Top)SARS-COV-2 Vaccination 12/27/2019, 01/20/2020, 08/29/2020   Pneumococcal Conjugate-13 05/27/2020   Pneumococcal Polysaccharide-23 08/30/2012, 05/30/2021   Respiratory Syncytial Virus Vaccine,Recomb Aduvanted(Arexvy) 08/18/2022   Tdap 08/30/2012   Zoster, Live 03/15/2016    Past Medical History:  Diagnosis Date   Bipolar disorder (HCC)    PT  ON TOPIRAMATE  AND ZANAX- SEES PSYCHIATRIST DR. COTTLE - PT STATES DOING WELL ON MEDICATIONS   COPD (chronic obstructive pulmonary disease) (HCC)    OCCAS USE OF INHALER-QUIT SMOKING 2014 - USING E- [employer]   Depression    Diverticulitis    GERD (gastroesophageal reflux disease)    H/O suicide attempt 09/18/2005   PT'S SON HAD COMMITTED SUICIDE AND SHE WAS OVER COME WITH GRIEF   Hypothyroidism    IBS (irritable bowel syndrome)    Lichen simplex chronicus    MVP (mitral valve prolapse)    DOES NOT CAUSE ANY PROBLEMS   Osteoporosis    Pain    RIGHT  SHOULDER AND LIMITED ROM - -RT SHOULDER IMPINGEMENT, ADHESIVE CAPSULITIS, ROTATOR CUFF TEAR   Pain    NECK PAIN -PT HOPING SURGERY ON HER SHOULDER HELPS HER NECK PAIN- DOES HAVE HX OF CERBVICAL DISK FUSION.   Pain    LUMBAR PAIN - DDD AND PREVIOUS LUMBAR SURGERY   Vitamin D deficiency     Tobacco History: Social History   Tobacco Use  Smoking Status Former   Current packs/day: 0.00   Average packs/day: 1 pack/day for 55.0 years (55.0 ttl pk-yrs)   Types: Cigarettes   Quit date: 06/2023   Years since quitting: 0.5  Smokeless Tobacco Current  Tobacco Comments   Smoking 1/2 ppd.  Trying to cut back.  Trying to find vape flavor she likes.  08/30/2022 hfb   Ready to quit: Not Answered Counseling given: Not Answered Tobacco comments: Smoking 1/2 ppd.  Trying to cut back.  Trying to find vape flavor she likes.  08/30/2022 hfb   Continue to not smoke  Outpatient Encounter Medications as of 01/23/2024  Medication Sig   albuterol (PROVENTIL HFA;VENTOLIN HFA) 108 (90 BASE) MCG/ACT inhaler Inhale 1 puff into the lungs every 6 (six) hours as needed for wheezing or shortness of breath.   ALPRAZolam  (XANAX ) 0.5 MG tablet TAKE 1 TABLET BY MOUTH IN THE MORNING, AT NOON IN THE EVENING AND AT BEDTIME   aspirin  EC 81 MG tablet Take 1 tablet (81 mg total) by mouth daily. Swallow whole.   azithromycin  (ZITHROMAX ) 250 MG tablet Take 1 tablet (250 mg  total) by mouth 3 (three) times a week. 1 tab on Monday, Wednesday, Friday   Biotin 16109 MCG TABS Take 10,000 mcg by mouth daily.   Budeson-Glycopyrrol-Formoterol (BREZTRI  AEROSPHERE) 160-9-4.8 MCG/ACT AERO Inhale 2 puffs into the lungs in the morning and at bedtime.   Cholecalciferol (VITAMIN D3) 50 MCG (2000 UT) capsule Take 2,000 Units by mouth daily.   citalopram  (CELEXA ) 20 MG tablet TAKE 1 TABLET BY MOUTH EVERY DAY   cloNIDine  (CATAPRES ) 0.1 MG tablet TAKE 1 IN THE MORNING AND 2 TABLETS AT NIGHT   clotrimazole  (MYCELEX ) 10 MG troche Take 1 tablet (10 mg total) by mouth 5 (five) times daily.   cyanocobalamin  (VITAMIN B12) 1000 MCG tablet Take 1,000 mcg by mouth daily.   denosumab  (PROLIA ) 60 MG/ML SOLN injection Inject 60 mg into the skin every 6 (six) months. Administer in upper arm, thigh, or abdomen   diphenhydrAMINE  (BENADRYL ) 50 MG tablet Pt to take 50 mg of prednisone  on 12/21/23 12:40AM 50 mg of prednisone  on 12/21/23 at 6:40AM am, and 50 mg of prednisone  on 12/21/23 at 12:40PM. Pt is also to take 50 mg of benadryl  on 12/21/23 at 12:40PM. Please call [phone] with any questions.   docusate sodium  (COLACE) 100 MG capsule Take 1 capsule (100 mg total) by mouth 2 (two) times daily as needed for mild constipation.   fluticasone (FLONASE) 50 MCG/ACT nasal spray Place 2 sprays into both nostrils daily as needed for allergies.   ipratropium (ATROVENT) 0.06 % nasal spray Place 2 sprays into both nostrils daily as needed for rhinitis.   lamoTRIgine (LAMICTAL) 25 MG tablet Take 50 mg by mouth 2 (two) times daily.   Lurasidone  HCl 120 MG TABS Take 1 tablet (120 mg total) by mouth daily.   omeprazole (PRILOSEC) 40 MG capsule Take 40 mg by mouth daily.   predniSONE  (DELTASONE ) 50 MG tablet Pt to take 50 mg of prednisone  on 12/21/23 12:40AM 50 mg of prednisone  on 12/21/23  at 6:40AM am, and 50 mg of prednisone  on 12/21/23 at 12:40PM. Pt is also to take 50 mg of benadryl  on 12/21/23 at 12:40PM. Please call  [phone] with any questions.   SYNTHROID 88 MCG tablet Take 88 mcg by mouth daily.   topiramate  (TOPAMAX ) 100 MG tablet TAKE 1 AND 1/2 TABLETS BY MOUTH DAILY   atorvastatin  (LIPITOR) 80 MG tablet Take 1 tablet (80 mg total) by mouth daily.   diphenhydrAMINE  (BENADRYL ) 50 MG tablet Take 1 tablet (50 mg total) by mouth once for 1 dose. Pt to take 50 mg of benadryl  on 08/03/21 at 10:40 am. Please call [phone] with any questions. (Patient not taking: Reported on 03/23/2023)   No facility-administered encounter medications on file as of 01/23/2024.     Review of Systems  Review of Systems  N/a Physical Exam  BP 94/62 (BP Location: Right Arm, Patient Position: Sitting, Cuff Size: Normal)   Pulse 88   Temp 98 F (36.7 C) (Oral)   Ht 5\' 7"  (1.702 m)   Wt 101 lb 6.4 oz (46 kg)   SpO2 99%   BMI 15.88 kg/m   Wt Readings from Last 5 Encounters:  01/23/24 101 lb 6.4 oz (46 kg)  06/27/23 111 lb 3.2 oz (50.4 kg)  04/04/23 115 lb (52.2 kg)  03/27/23 115 lb (52.2 kg)  02/26/23 115 lb 12.8 oz (52.5 kg)    BMI Readings from Last 5 Encounters:  01/23/24 15.88 kg/m  06/27/23 17.42 kg/m  04/04/23 18.01 kg/m  03/27/23 18.01 kg/m  02/26/23 18.14 kg/m     Physical Exam General: Sitting in chair, frail appearing Eyes: EOMI, no icterus Neck: Supple, no JVP Pulmonary: Distant, clear, no work of breathing Cardiovascular: Warm, no edema Abdomen: Nondistended, bowel sounds present MSK: No synovitis, no joint effusion Neuro: Normal gait, no weakness Psych: Normal mood, full affect   Assessment & Plan:   COPD: Doing ok, issues with access to inhalers due to cost.  Will qualify for manufacturer assistance but cannot get enough for cannot afford the co-pay out-of-pocket cost at once.  May miss work for the year and can get it.  Samples of Trelegy, continue to provide samples as able.  Tobacco abuse in remission: Continue lung cancer screening, lung RADS 1 10/2023.   Return in  about 6 months (around 07/25/2024) for f/u Dr. Marygrace Snellen.   Guerry Leek, MD 01/23/2024

## 2024-01-23 NOTE — Addendum Note (Signed)
 Addended byRefugio Cantor M on: 01/23/2024 04:48 PM   Modules accepted: Orders

## 2024-01-31 ENCOUNTER — Other Ambulatory Visit: Payer: Self-pay | Admitting: Psychiatry

## 2024-01-31 DIAGNOSIS — F4001 Agoraphobia with panic disorder: Secondary | ICD-10-CM

## 2024-01-31 DIAGNOSIS — F3181 Bipolar II disorder: Secondary | ICD-10-CM

## 2024-02-15 DIAGNOSIS — R194 Change in bowel habit: Secondary | ICD-10-CM | POA: Diagnosis not present

## 2024-02-15 DIAGNOSIS — R1310 Dysphagia, unspecified: Secondary | ICD-10-CM | POA: Diagnosis not present

## 2024-02-21 DIAGNOSIS — E43 Unspecified severe protein-calorie malnutrition: Secondary | ICD-10-CM | POA: Diagnosis not present

## 2024-02-21 DIAGNOSIS — Q2381 Bicuspid aortic valve: Secondary | ICD-10-CM | POA: Diagnosis not present

## 2024-02-21 DIAGNOSIS — F3170 Bipolar disorder, currently in remission, most recent episode unspecified: Secondary | ICD-10-CM | POA: Diagnosis not present

## 2024-02-21 DIAGNOSIS — J449 Chronic obstructive pulmonary disease, unspecified: Secondary | ICD-10-CM | POA: Diagnosis not present

## 2024-02-22 ENCOUNTER — Other Ambulatory Visit: Payer: Self-pay | Admitting: Psychiatry

## 2024-02-22 DIAGNOSIS — F4310 Post-traumatic stress disorder, unspecified: Secondary | ICD-10-CM

## 2024-02-22 DIAGNOSIS — F411 Generalized anxiety disorder: Secondary | ICD-10-CM

## 2024-02-22 DIAGNOSIS — F4001 Agoraphobia with panic disorder: Secondary | ICD-10-CM

## 2024-02-27 ENCOUNTER — Encounter: Payer: Self-pay | Admitting: Psychiatry

## 2024-02-27 ENCOUNTER — Ambulatory Visit: Admitting: Psychiatry

## 2024-02-27 VITALS — BP 101/54 | HR 82

## 2024-02-27 DIAGNOSIS — F411 Generalized anxiety disorder: Secondary | ICD-10-CM

## 2024-02-27 DIAGNOSIS — F4001 Agoraphobia with panic disorder: Secondary | ICD-10-CM | POA: Diagnosis not present

## 2024-02-27 DIAGNOSIS — F3181 Bipolar II disorder: Secondary | ICD-10-CM | POA: Diagnosis not present

## 2024-02-27 DIAGNOSIS — F3162 Bipolar disorder, current episode mixed, moderate: Secondary | ICD-10-CM

## 2024-02-27 DIAGNOSIS — F4310 Post-traumatic stress disorder, unspecified: Secondary | ICD-10-CM | POA: Diagnosis not present

## 2024-02-27 MED ORDER — CLONIDINE HCL 0.1 MG PO TABS
ORAL_TABLET | ORAL | 1 refills | Status: DC
Start: 2024-02-27 — End: 2024-05-07

## 2024-02-27 MED ORDER — CITALOPRAM HYDROBROMIDE 20 MG PO TABS
30.0000 mg | ORAL_TABLET | Freq: Every day | ORAL | 0 refills | Status: DC
Start: 2024-02-27 — End: 2024-05-07

## 2024-02-27 MED ORDER — LURASIDONE HCL 120 MG PO TABS: 120.0000 mg | ORAL_TABLET | Freq: Every day | ORAL | 5 refills | Status: AC

## 2024-02-27 MED ORDER — ALPRAZOLAM 0.5 MG PO TABS
ORAL_TABLET | ORAL | 3 refills | Status: DC
Start: 2024-02-27 — End: 2024-04-07

## 2024-02-27 MED ORDER — TOPIRAMATE 100 MG PO TABS: ORAL_TABLET | ORAL | 1 refills | Status: AC

## 2024-02-27 NOTE — Progress Notes (Signed)
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 anger outbursts.  Did some reality testing and problem solving .  Setting expectations and anger management  Supportive of smoking cessation.  No craving right now.  No med changes indicated. Increase again citalopram  to 30 mg daily DT recent dep and anxiety Benefit of  clonidine  off lable for anxiety and mania; Continue clonidine  0.1 mg every morning and 2 mg nightly off-label for mania.  Canno t increase DT BP Continue Latuda  120 mg daily Continue lamotrigine 50 mg BID from neuro Continue Xanax  0.5 mg twice daily and 1 mg nightly  Fu on 2  mos  Nori Beat, MD, DFAPA   Please see After Visit Summary for patient specific instructions.  Future Appointments  Date Time Provider Department Center  05/08/2024  2:00 PM Cottle, Kennedy Peabody., MD [facility] None      No orders of the defined types were placed in this  encounter.    -------------------------------

## 2024-03-26 DIAGNOSIS — N390 Urinary tract infection, site not specified: Secondary | ICD-10-CM | POA: Diagnosis not present

## 2024-04-06 ENCOUNTER — Other Ambulatory Visit: Payer: Self-pay | Admitting: Adult Health

## 2024-04-06 DIAGNOSIS — F4001 Agoraphobia with panic disorder: Secondary | ICD-10-CM

## 2024-04-06 DIAGNOSIS — F3181 Bipolar II disorder: Secondary | ICD-10-CM

## 2024-04-09 ENCOUNTER — Other Ambulatory Visit (HOSPITAL_COMMUNITY): Payer: Self-pay | Admitting: Gastroenterology

## 2024-04-09 DIAGNOSIS — K2289 Other specified disease of esophagus: Secondary | ICD-10-CM | POA: Diagnosis not present

## 2024-04-09 DIAGNOSIS — K3189 Other diseases of stomach and duodenum: Secondary | ICD-10-CM | POA: Diagnosis not present

## 2024-04-09 DIAGNOSIS — R1310 Dysphagia, unspecified: Secondary | ICD-10-CM | POA: Diagnosis not present

## 2024-04-09 DIAGNOSIS — R109 Unspecified abdominal pain: Secondary | ICD-10-CM

## 2024-04-09 DIAGNOSIS — K2930 Chronic superficial gastritis without bleeding: Secondary | ICD-10-CM | POA: Diagnosis not present

## 2024-04-11 DIAGNOSIS — K2289 Other specified disease of esophagus: Secondary | ICD-10-CM | POA: Diagnosis not present

## 2024-04-11 DIAGNOSIS — K2930 Chronic superficial gastritis without bleeding: Secondary | ICD-10-CM | POA: Diagnosis not present

## 2024-05-06 ENCOUNTER — Other Ambulatory Visit (HOSPITAL_COMMUNITY)

## 2024-05-06 ENCOUNTER — Encounter (HOSPITAL_COMMUNITY): Payer: Self-pay

## 2024-05-07 ENCOUNTER — Encounter: Payer: Self-pay | Admitting: Psychiatry

## 2024-05-07 ENCOUNTER — Ambulatory Visit: Admitting: Psychiatry

## 2024-05-07 DIAGNOSIS — F3162 Bipolar disorder, current episode mixed, moderate: Secondary | ICD-10-CM | POA: Diagnosis not present

## 2024-05-07 DIAGNOSIS — F3181 Bipolar II disorder: Secondary | ICD-10-CM | POA: Diagnosis not present

## 2024-05-07 DIAGNOSIS — F4310 Post-traumatic stress disorder, unspecified: Secondary | ICD-10-CM | POA: Diagnosis not present

## 2024-05-07 DIAGNOSIS — F4001 Agoraphobia with panic disorder: Secondary | ICD-10-CM

## 2024-05-07 DIAGNOSIS — F411 Generalized anxiety disorder: Secondary | ICD-10-CM

## 2024-05-07 MED ORDER — CLONIDINE HCL 0.1 MG PO TABS: ORAL_TABLET | ORAL | 1 refills | Status: DC

## 2024-05-07 MED ORDER — ALPRAZOLAM 0.5 MG PO TABS: ORAL_TABLET | ORAL | 5 refills | Status: AC

## 2024-05-07 MED ORDER — CITALOPRAM HYDROBROMIDE 20 MG PO TABS: 30.0000 mg | ORAL_TABLET | Freq: Every day | ORAL | 1 refills | Status: DC

## 2024-05-07 NOTE — Progress Notes (Signed)
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 atypical antipsychotics, as well as potential risk for movement side effects. Advised pt to contact office if movement side effects occur.  No evidence for TD.  Counseling 20 min:  supportive and problem solving around multiple family stressors and the resultant dep and anger outbursts.  Did some reality testing and problem solving .  Setting expectations and anger management  Supportive of smoking cessation.  No craving right now.  No med changes indicated. Continue citalopram  to 30 mg daily DT recent dep and anxiety Benefit of  clonidine  off lable for anxiety and mania; Continue clonidine  0.1 mg every morning and 2 mg nightly off-label for mania.  Canno t increase DT BP Continue Latuda  120 mg daily Continue lamotrigine 50 mg BID from neuro Continue Xanax  0.5 mg twice daily and 1 mg nightly  Fu on 2  mos  Lorene Macintosh, MD, DFAPA   Please see After Visit Summary for patient specific instructions.  Future Appointments  Date Time Provider Department Center  07/22/2024  9:00 AM [name] Soyla LABOR, MD [facility]      No orders of  the defined types were placed in this encounter.    -------------------------------

## 2024-05-08 ENCOUNTER — Ambulatory Visit: Admitting: Psychiatry

## 2024-05-22 DIAGNOSIS — M25561 Pain in right knee: Secondary | ICD-10-CM | POA: Diagnosis not present

## 2024-05-22 DIAGNOSIS — M545 Low back pain, unspecified: Secondary | ICD-10-CM | POA: Diagnosis not present

## 2024-05-28 DIAGNOSIS — L659 Nonscarring hair loss, unspecified: Secondary | ICD-10-CM | POA: Diagnosis not present

## 2024-05-28 DIAGNOSIS — E559 Vitamin D deficiency, unspecified: Secondary | ICD-10-CM | POA: Diagnosis not present

## 2024-05-28 DIAGNOSIS — E039 Hypothyroidism, unspecified: Secondary | ICD-10-CM | POA: Diagnosis not present

## 2024-05-28 DIAGNOSIS — E538 Deficiency of other specified B group vitamins: Secondary | ICD-10-CM | POA: Diagnosis not present

## 2024-05-28 DIAGNOSIS — R5383 Other fatigue: Secondary | ICD-10-CM | POA: Diagnosis not present

## 2024-05-28 DIAGNOSIS — E43 Unspecified severe protein-calorie malnutrition: Secondary | ICD-10-CM | POA: Diagnosis not present

## 2024-06-17 DIAGNOSIS — D492 Neoplasm of unspecified behavior of bone, soft tissue, and skin: Secondary | ICD-10-CM | POA: Diagnosis not present

## 2024-06-17 DIAGNOSIS — L821 Other seborrheic keratosis: Secondary | ICD-10-CM | POA: Diagnosis not present

## 2024-06-17 DIAGNOSIS — L814 Other melanin hyperpigmentation: Secondary | ICD-10-CM | POA: Diagnosis not present

## 2024-06-27 DIAGNOSIS — E785 Hyperlipidemia, unspecified: Secondary | ICD-10-CM | POA: Diagnosis not present

## 2024-06-27 DIAGNOSIS — Z23 Encounter for immunization: Secondary | ICD-10-CM | POA: Diagnosis not present

## 2024-06-27 DIAGNOSIS — E559 Vitamin D deficiency, unspecified: Secondary | ICD-10-CM | POA: Diagnosis not present

## 2024-06-27 DIAGNOSIS — I700 Atherosclerosis of aorta: Secondary | ICD-10-CM | POA: Diagnosis not present

## 2024-06-27 DIAGNOSIS — E43 Unspecified severe protein-calorie malnutrition: Secondary | ICD-10-CM | POA: Diagnosis not present

## 2024-06-27 DIAGNOSIS — I77810 Thoracic aortic ectasia: Secondary | ICD-10-CM | POA: Diagnosis not present

## 2024-06-27 DIAGNOSIS — F3170 Bipolar disorder, currently in remission, most recent episode unspecified: Secondary | ICD-10-CM | POA: Diagnosis not present

## 2024-06-27 DIAGNOSIS — J449 Chronic obstructive pulmonary disease, unspecified: Secondary | ICD-10-CM | POA: Diagnosis not present

## 2024-06-27 DIAGNOSIS — E538 Deficiency of other specified B group vitamins: Secondary | ICD-10-CM | POA: Diagnosis not present

## 2024-06-27 DIAGNOSIS — E039 Hypothyroidism, unspecified: Secondary | ICD-10-CM | POA: Diagnosis not present

## 2024-06-27 DIAGNOSIS — Z0000 Encounter for general adult medical examination without abnormal findings: Secondary | ICD-10-CM | POA: Diagnosis not present

## 2024-06-27 DIAGNOSIS — M810 Age-related osteoporosis without current pathological fracture: Secondary | ICD-10-CM | POA: Diagnosis not present

## 2024-07-18 ENCOUNTER — Encounter: Payer: Self-pay | Admitting: *Deleted

## 2024-07-22 ENCOUNTER — Ambulatory Visit: Attending: Internal Medicine | Admitting: Internal Medicine

## 2024-07-22 ENCOUNTER — Encounter: Payer: Self-pay | Admitting: Internal Medicine

## 2024-07-22 VITALS — BP 92/62 | HR 65 | Ht 67.0 in | Wt 110.6 lb

## 2024-07-22 DIAGNOSIS — E785 Hyperlipidemia, unspecified: Secondary | ICD-10-CM

## 2024-07-22 DIAGNOSIS — J449 Chronic obstructive pulmonary disease, unspecified: Secondary | ICD-10-CM | POA: Diagnosis not present

## 2024-07-22 DIAGNOSIS — R0609 Other forms of dyspnea: Secondary | ICD-10-CM

## 2024-07-22 DIAGNOSIS — Q2381 Bicuspid aortic valve: Secondary | ICD-10-CM

## 2024-07-22 DIAGNOSIS — E039 Hypothyroidism, unspecified: Secondary | ICD-10-CM

## 2024-07-22 DIAGNOSIS — I77810 Thoracic aortic ectasia: Secondary | ICD-10-CM

## 2024-07-22 DIAGNOSIS — F3181 Bipolar II disorder: Secondary | ICD-10-CM | POA: Diagnosis not present

## 2024-07-22 DIAGNOSIS — I350 Nonrheumatic aortic (valve) stenosis: Secondary | ICD-10-CM | POA: Diagnosis not present

## 2024-07-22 MED ORDER — PREDNISONE 50 MG PO TABS
ORAL_TABLET | ORAL | 0 refills | Status: AC
Start: 2024-07-22 — End: ?

## 2024-07-22 NOTE — Progress Notes (Signed)
 Cardiology Office Note:  .   Date:  07/22/2024  ID:  [name], DOB [birth year], MRN [id number] PCP: Teresa Channel, MD   [facility] Providers Cardiologist:  Soyla DELENA Merck, MD    History of Present Illness: .   [name] is a 68 y.o. female.  Discussed the use of AI scribe software for clinical note transcription with the patient, who gave verbal consent to proceed.  History of Present Illness [name] is a 68 year old female with bicuspid aortic valve stenosis and COPD who presents with worsening shortness of breath.  Bicuspid aortic valve stenosis was initially identified after a syncopal episode in 2020. The last echocardiogram in August 2024 showed moderate aortic valve stenosis. No recurrent syncope has occurred since the initial episode.  Over the past three months, she experiences worsening shortness of breath, particularly with exertion. The frequency of these episodes has increased, though the severity has not. There is no associated chest discomfort, pressure, pain, or tightness.  She has COPD and quit smoking almost two years ago but continues to vape. She uses a rescue inhaler occasionally and has been prescribed Trelegy, which she does not use daily. She experiences hoarseness and occasional right-sided chest discomfort.  Her hyperlipidemia is managed with atorvastatin  80 mg daily, with good control. Her family history is significant for heart attacks, which she is concerned about given her own cardiac history.    ROS: negative except per HPI above.  Studies Reviewed: [name]   EKG Interpretation Date/Time:  Tuesday July 22 2024 08:56:05 EST Ventricular Rate:  65 PR Interval:  196 QRS Duration:  68 QT Interval:  414 QTC Calculation: 430 R Axis:   8  Text Interpretation: Normal sinus rhythm Nonspecific ST abnormality When compared with ECG of 15-Dec-2005 10:14, Nonspecific T wave abnormality no longer evident in Lateral leads Confirmed by  Merck Soyla ([id number]) on 07/22/2024 9:01:42 AM    Results RADIOLOGY Echocardiogram: Moderate aortic valve stenosis (August 2024)  DIAGNOSTIC EKG: Normal Stress test: Normal (2021) Risk Assessment/Calculations:       Physical Exam:   VS:  BP 92/62   Pulse 65   Ht 5' 7 (1.702 m)   Wt 110 lb 9.6 oz (50.2 kg)   SpO2 99%   BMI 17.32 kg/m    Wt Readings from Last 3 Encounters:  07/22/24 110 lb 9.6 oz (50.2 kg)  01/23/24 101 lb 6.4 oz (46 kg)  06/27/23 111 lb 3.2 oz (50.4 kg)     Physical Exam GENERAL: Alert, cooperative, well developed, no acute distress. HEENT: Normocephalic, normal oropharynx, moist mucous membranes. CHEST: Clear to auscultation bilaterally, no wheezes, rhonchi, or crackles. CARDIOVASCULAR: Normal heart rate and rhythm, S1 and S2 normal. Heart sounds faintly audible. ABDOMEN: Soft, non-tender, non-distended, without organomegaly, normal bowel sounds. EXTREMITIES: No cyanosis or edema. NEUROLOGICAL: Cranial nerves grossly intact, moves all extremities without gross motor or sensory deficit.   ASSESSMENT AND PLAN: .    Assessment and Plan Assessment & Plan Moderate bicuspid aortic valve stenosis DOE Moderate stenosis progressed from mild. No syncope since 2020. Shortness of breath may relate to stenosis, COPD, or vaping. Future intervention may be necessary if stenosis progresses. - Order echocardiogram within the next month to assess aortic stenosis progression. - Order CT scan of the heart to evaluate for coronary artery blockages. This will also help us  score the aortic valve for severity. - Order ultrasound of the carotid arteries to check for blockages given patient concern for screening.  Thoracic aortic ectasia Aorta dilation remains stable over serial echocardiograms.  Chronic obstructive pulmonary disease (COPD) Shortness of breath may relate to COPD, smoking, or vaping. Inconsistent inhaler use. Hoarseness may relate to smoking or vaping.  She is attempting to quit vaping. Emphasized daily inhaler use importance. - Advise to use Trelegy per instructions. - Schedule an appointment with the pulmonary doctor to discuss inhaler use and COPD management.  Hyperlipidemia Well controlled on atorvastatin  80 mg daily. Recent blood work normal, cholesterol not specifically checked.  Hypothyroidism Thyroid  lab tests are normal.  Bipolar disorder Managed with clonidine  for mood stabilization. Clonidine  may contribute to low blood pressure but is effective for mood management.  Fatigue Fatigue persists, normal vitamin D and B12 levels. Potentially related to mood medications or other factors. Further investigation into cardiac function planned. - Order echocardiogram to assess heart function and rule out cardiac causes of fatigue.  Recording duration: 20 minutes      Lamarius Dirr, MD, FACC

## 2024-07-22 NOTE — Patient Instructions (Addendum)
 Medication Instructions:  No Changes  *See below instructions for preparation of the Cardiac CT (Prednisone  and Benadryl )  Lab Work: None  Testing/Procedures: Your physician has requested that you have a carotid duplex. This test is an ultrasound of the carotid arteries in your neck. It looks at blood flow through these arteries that supply the brain with blood. Allow one hour for this exam. There are no restrictions or special instructions. This will take place at [address], 4th floor  Please note: We ask at that you not bring children with you during ultrasound (echo/ vascular) testing. Due to room size and safety concerns, children are not allowed in the ultrasound rooms during exams. Our front office staff cannot provide observation of children in our lobby area while testing is being conducted. An adult accompanying a patient to their appointment will only be allowed in the ultrasound room at the discretion of the ultrasound technician under special circumstances. We apologize for any inconvenience.   Your physician has requested that you have an echocardiogram. Echocardiography is a painless test that uses sound waves to create images of your heart. It provides your doctor with information about the size and shape of your heart and how well your heart's chambers and valves are working. This procedure takes approximately one hour. There are no restrictions for this procedure. Please do NOT wear cologne, perfume, aftershave, or lotions (deodorant is allowed). Please arrive 15 minutes prior to your appointment time.  Please note: We ask at that you not bring children with you during ultrasound (echo/ vascular) testing. Due to room size and safety concerns, children are not allowed in the ultrasound rooms during exams. Our front office staff cannot provide observation of children in our lobby area while testing is being conducted. An adult accompanying a patient to their appointment will  only be allowed in the ultrasound room at the discretion of the ultrasound technician under special circumstances. We apologize for any inconvenience.     Your cardiac CT will be scheduled at the below location:    [facility] [address]  [place], [place] [address]   If scheduled at the [facility] at [address], please enter the parking lot using the [address] entrance and use the FREE valet service at the patient drop-off area. Enter the building and check-in with registration on the main floor.   Please follow these instructions carefully (unless otherwise directed):  An IV will be required for this test and Nitroglycerin will be given.  Hold all erectile dysfunction medications at least 3 days (72 hrs) prior to test. (Ie viagra, cialis, sildenafil, tadalafil, etc)   On the Night Before the Test: Be sure to Drink plenty of water. Do not consume any caffeinated/decaffeinated beverages or chocolate 12 hours prior to your test. Do not take any antihistamines 12 hours prior to your test.  If the patient has contrast allergy: Patient will need a prescription for Prednisone  and very clear instructions (as follows): Prednisone  50 mg - take 13 hours prior to test Take another Prednisone  50 mg 7 hours prior to test Take another Prednisone  50 mg 1 hour prior to test Take Benadryl  50 mg 1 hour prior to test Patient must complete all four doses of above prophylactic medications. Patient will need a ride after test due to Benadryl .  On the Day of the Test: Drink plenty of water until 1 hour prior to the test. Do not eat any food 1  hour prior to test. You may take your regular medications prior to the test.  If you take Furosemide/Hydrochlorothiazide/Spironolactone/Chlorthalidone, please HOLD on the morning of the test. Patients who wear a continuous glucose monitor MUST remove the device prior to scanning. FEMALES- please wear  underwire-free bra if available, avoid dresses & tight clothing      After the Test: Drink plenty of water. After receiving IV contrast, you may experience a mild flushed feeling. This is normal. On occasion, you may experience a mild rash up to 24 hours after the test. This is not dangerous. If this occurs, you can take Benadryl  25 mg, Zyrtec, Claritin, or Allegra and increase your fluid intake. (Patients taking Tikosyn should avoid Benadryl , and may take Zyrtec, Claritin, or Allegra) If you experience trouble breathing, this can be serious. If it is severe call 911 IMMEDIATELY. If it is mild, please call our office.  We will call to schedule your test 2-4 weeks out understanding that some insurance companies will need an authorization prior to the service being performed.   For more information and frequently asked questions, please visit our website : [email or web address]  For non-scheduling related questions, please contact the cardiac imaging nurse navigator should you have any questions/concerns: Cardiac Imaging Nurse Navigators Direct Office Dial: [phone]   For scheduling needs, including cancellations and rescheduling, please call Brittany, [phone].   Follow-Up: At [facility], you and your health needs are our priority.  As part of our continuing mission to provide you with exceptional heart care, our providers are all part of one team.  This team includes your primary Cardiologist (physician) and Advanced Practice Providers or APPs (Physician Assistants and Nurse Practitioners) who all work together to provide you with the care you need, when you need it.  Your next appointment:   4-6 week(s)  Provider:   Soyla DELENA Merck, MD or  Callie Goodrich, PA or [place], [place] or [place], [place]     Other: Please call your Pulmonologist's office for a follow up appointment with them, as you have been short of breath.

## 2024-07-24 ENCOUNTER — Ambulatory Visit (HOSPITAL_COMMUNITY)
Admission: RE | Admit: 2024-07-24 | Discharge: 2024-07-24 | Disposition: A | Discharge status: Home / Self Care | Source: Ambulatory Visit | Attending: Internal Medicine | Admitting: Internal Medicine

## 2024-07-24 ENCOUNTER — Ambulatory Visit: Payer: Self-pay | Admitting: Internal Medicine

## 2024-07-24 DIAGNOSIS — I77810 Thoracic aortic ectasia: Secondary | ICD-10-CM | POA: Diagnosis not present

## 2024-07-24 DIAGNOSIS — I6523 Occlusion and stenosis of bilateral carotid arteries: Secondary | ICD-10-CM | POA: Insufficient documentation

## 2024-07-24 DIAGNOSIS — I350 Nonrheumatic aortic (valve) stenosis: Secondary | ICD-10-CM | POA: Insufficient documentation

## 2024-07-24 DIAGNOSIS — R0609 Other forms of dyspnea: Secondary | ICD-10-CM

## 2024-07-24 DIAGNOSIS — Q2381 Bicuspid aortic valve: Secondary | ICD-10-CM | POA: Insufficient documentation

## 2024-07-28 ENCOUNTER — Encounter (HOSPITAL_COMMUNITY): Payer: Self-pay

## 2024-07-29 ENCOUNTER — Telehealth (HOSPITAL_COMMUNITY): Payer: Self-pay | Admitting: *Deleted

## 2024-07-29 NOTE — Telephone Encounter (Signed)
 Reaching out to patient to offer assistance regarding upcoming cardiac imaging study; pt verbalizes understanding of appt date/time, parking situation and where to check in, pre-test NPO status and medications ordered, and verified current allergies; name and call back number provided for further questions should they arise  Chantal Requena RN Navigator Cardiac Imaging [facility] [phone] office [phone] cell  Reviewed how to take 13 hour prep. She verbalized understanding of how to  take prep.

## 2024-07-30 ENCOUNTER — Ambulatory Visit (HOSPITAL_COMMUNITY)
Admission: RE | Admit: 2024-07-30 | Discharge: 2024-07-30 | Disposition: A | Discharge status: Home / Self Care | Source: Ambulatory Visit | Attending: Internal Medicine | Admitting: Internal Medicine

## 2024-07-30 DIAGNOSIS — Q2381 Bicuspid aortic valve: Secondary | ICD-10-CM | POA: Insufficient documentation

## 2024-07-30 DIAGNOSIS — I700 Atherosclerosis of aorta: Secondary | ICD-10-CM | POA: Diagnosis not present

## 2024-07-30 DIAGNOSIS — I2510 Atherosclerotic heart disease of native coronary artery without angina pectoris: Secondary | ICD-10-CM | POA: Insufficient documentation

## 2024-07-30 DIAGNOSIS — R0609 Other forms of dyspnea: Secondary | ICD-10-CM | POA: Diagnosis not present

## 2024-07-30 MED ORDER — NITROGLYCERIN 0.4 MG SL SUBL
0.8000 mg | SUBLINGUAL_TABLET | Freq: Once | SUBLINGUAL | Status: AC
  Administered 2024-07-30: 0.8 mg via SUBLINGUAL

## 2024-07-30 MED ORDER — IOHEXOL 350 MG/ML SOLN
95.0000 mL | Freq: Once | INTRAVENOUS | Status: AC | PRN
  Administered 2024-07-30: 95 mL via INTRAVENOUS

## 2024-08-07 DIAGNOSIS — Z1231 Encounter for screening mammogram for malignant neoplasm of breast: Secondary | ICD-10-CM | POA: Diagnosis not present

## 2024-08-19 ENCOUNTER — Other Ambulatory Visit: Payer: Self-pay | Admitting: Pulmonary Disease

## 2024-08-19 ENCOUNTER — Ambulatory Visit (HOSPITAL_COMMUNITY)
Admission: RE | Admit: 2024-08-19 | Discharge: 2024-08-19 | Disposition: A | Discharge status: Home / Self Care | Source: Ambulatory Visit | Attending: Internal Medicine | Admitting: Internal Medicine

## 2024-08-19 DIAGNOSIS — R0609 Other forms of dyspnea: Secondary | ICD-10-CM | POA: Insufficient documentation

## 2024-08-19 DIAGNOSIS — Q2381 Bicuspid aortic valve: Secondary | ICD-10-CM | POA: Insufficient documentation

## 2024-08-19 DIAGNOSIS — I350 Nonrheumatic aortic (valve) stenosis: Secondary | ICD-10-CM | POA: Insufficient documentation

## 2024-08-19 LAB — ECHOCARDIOGRAM COMPLETE
AR max vel: 0.71 cm2
AV Area VTI: 0.75 cm2
AV Area mean vel: 0.75 cm2
AV Mean grad: 21.5 mmHg
AV Peak grad: 38.6 mmHg
Ao pk vel: 3.11 m/s
Area-P 1/2: 2.72 cm2
S' Lateral: 2.3 cm

## 2024-08-19 NOTE — Telephone Encounter (Signed)
 Dr. Annella, please advise. Medication is not mentioned in last OV.

## 2024-08-21 ENCOUNTER — Telehealth: Payer: Self-pay

## 2024-08-21 NOTE — Telephone Encounter (Signed)
 Copied from CRM #[id number]. Topic: Clinical - Medication Question >> Aug 21, 2024 12:39 PM [name] wrote: Reason for CRM: Patient would like to confirm if she should still be on the medication azithromycin  (ZITHROMAX ) 250 MG tablet [[id number]] - patient has not received her refills.   Callback number: [phone]   Routing to Dr Annella to advise

## 2024-08-26 ENCOUNTER — Other Ambulatory Visit: Payer: Self-pay | Admitting: Pulmonary Disease

## 2024-08-26 ENCOUNTER — Telehealth: Payer: Self-pay

## 2024-08-26 NOTE — Telephone Encounter (Unsigned)
 Copied from CRM #[id number]. Topic: Clinical - Medication Question >> Aug 21, 2024 12:39 PM Isabell A wrote: Reason for CRM: Patient would like to confirm if she should still be on the medication azithromycin  (ZITHROMAX ) 250 MG tablet [[id number]] - patient has not received her refills.   Callback number: [phone] >> Aug 26, 2024 11:42 AM Rozanna MATSU wrote: Pt called about her medicaition refill, I sent the med refill request for pt. The prescription expired 06/2024 >> Aug 22, 2024 10:06 AM Lavanda D wrote: Patient is calling again for an update on her request for this medication.

## 2024-08-26 NOTE — Telephone Encounter (Signed)
 Copied from CRM #[id number]. Topic: Clinical - Medication Refill >> Aug 26, 2024 11:39 AM Rozanna MATSU wrote: Medication: azithromycin  (ZITHROMAX ) 250 MG tablet [Pharmacy Med Name: AZITHROMYCIN   Has the patient contacted their pharmacy? Yes (Agent: If no, request that the patient contact the pharmacy for the refill. If patient does not wish to contact the pharmacy document the reason why and proceed with request.) (Agent: If yes, when and what did the pharmacy advise?)  This is the patient's preferred pharmacy:  [id number] - [place], [place] - [address] AT [address] OF [address] [place] [address] Phone: [phone] Fax: [phone]  Is this the correct pharmacy for this prescription? Yes If no, delete pharmacy and type the correct one.   Has the prescription been filled recently? Yes  Is the patient out of the medication? Yes  Has the patient been seen for an appointment in the last year OR does the patient have an upcoming appointment? Yes  Can we respond through MyChart? No  Agent: Please be advised that Rx refills may take up to 3 business days. We ask that you follow-up with your pharmacy.

## 2024-08-27 ENCOUNTER — Emergency Department (HOSPITAL_COMMUNITY)

## 2024-08-27 ENCOUNTER — Encounter (HOSPITAL_COMMUNITY): Payer: Self-pay

## 2024-08-27 ENCOUNTER — Ambulatory Visit: Admitting: Physician Assistant

## 2024-08-27 ENCOUNTER — Emergency Department (HOSPITAL_COMMUNITY)
Admission: EM | Admit: 2024-08-27 | Discharge: 2024-08-27 | Disposition: A | Discharge status: Home / Self Care | Attending: Emergency Medicine | Admitting: Emergency Medicine

## 2024-08-27 ENCOUNTER — Other Ambulatory Visit: Payer: Self-pay

## 2024-08-27 DIAGNOSIS — M5031 Other cervical disc degeneration,  high cervical region: Secondary | ICD-10-CM | POA: Diagnosis not present

## 2024-08-27 DIAGNOSIS — Z7982 Long term (current) use of aspirin: Secondary | ICD-10-CM | POA: Diagnosis not present

## 2024-08-27 DIAGNOSIS — S0990XA Unspecified injury of head, initial encounter: Secondary | ICD-10-CM | POA: Diagnosis present

## 2024-08-27 DIAGNOSIS — J449 Chronic obstructive pulmonary disease, unspecified: Secondary | ICD-10-CM | POA: Diagnosis not present

## 2024-08-27 DIAGNOSIS — I771 Stricture of artery: Secondary | ICD-10-CM | POA: Diagnosis not present

## 2024-08-27 DIAGNOSIS — M79641 Pain in right hand: Secondary | ICD-10-CM | POA: Insufficient documentation

## 2024-08-27 DIAGNOSIS — S2222XA Fracture of body of sternum, initial encounter for closed fracture: Secondary | ICD-10-CM | POA: Diagnosis not present

## 2024-08-27 DIAGNOSIS — Z7951 Long term (current) use of inhaled steroids: Secondary | ICD-10-CM | POA: Diagnosis not present

## 2024-08-27 DIAGNOSIS — R079 Chest pain, unspecified: Secondary | ICD-10-CM | POA: Diagnosis not present

## 2024-08-27 DIAGNOSIS — S7001XA Contusion of right hip, initial encounter: Secondary | ICD-10-CM | POA: Diagnosis not present

## 2024-08-27 DIAGNOSIS — S2220XA Unspecified fracture of sternum, initial encounter for closed fracture: Secondary | ICD-10-CM | POA: Diagnosis not present

## 2024-08-27 DIAGNOSIS — R55 Syncope and collapse: Secondary | ICD-10-CM | POA: Diagnosis not present

## 2024-08-27 DIAGNOSIS — Z23 Encounter for immunization: Secondary | ICD-10-CM | POA: Diagnosis not present

## 2024-08-27 DIAGNOSIS — Y92410 Unspecified street and highway as the place of occurrence of the external cause: Secondary | ICD-10-CM | POA: Diagnosis not present

## 2024-08-27 DIAGNOSIS — R109 Unspecified abdominal pain: Secondary | ICD-10-CM | POA: Diagnosis present

## 2024-08-27 DIAGNOSIS — J432 Centrilobular emphysema: Secondary | ICD-10-CM | POA: Diagnosis not present

## 2024-08-27 DIAGNOSIS — R072 Precordial pain: Secondary | ICD-10-CM | POA: Diagnosis present

## 2024-08-27 DIAGNOSIS — I959 Hypotension, unspecified: Secondary | ICD-10-CM | POA: Diagnosis not present

## 2024-08-27 DIAGNOSIS — Z87891 Personal history of nicotine dependence: Secondary | ICD-10-CM | POA: Diagnosis not present

## 2024-08-27 DIAGNOSIS — S3993XA Unspecified injury of pelvis, initial encounter: Secondary | ICD-10-CM | POA: Diagnosis not present

## 2024-08-27 LAB — BASIC METABOLIC PANEL WITH GFR
Anion gap: 11 (ref 5–15)
BUN: 8 mg/dL (ref 8–23)
CO2: 20 mmol/L — ABNORMAL LOW (ref 22–32)
Calcium: 9.6 mg/dL (ref 8.9–10.3)
Chloride: 108 mmol/L (ref 98–111)
Creatinine, Ser: 0.87 mg/dL (ref 0.44–1.00)
GFR, Estimated: >60 mL/min (ref >60)
Glucose, Bld: 104 mg/dL — ABNORMAL HIGH (ref 70–99)
Potassium: 3.8 mmol/L (ref 3.5–5.1)
Sodium: 139 mmol/L (ref 135–145)

## 2024-08-27 LAB — CBC
HCT: 42.8 % (ref 36.0–46.0)
Hemoglobin: 14.2 g/dL (ref 12.0–15.0)
MCH: 31.3 pg (ref 26.0–34.0)
MCHC: 33.2 g/dL (ref 30.0–36.0)
MCV: 94.5 fL (ref 80.0–100.0)
Platelets: 224 K/uL (ref 150–400)
RBC: 4.53 MIL/uL (ref 3.87–5.11)
RDW: 12.8 % (ref 11.5–15.5)
WBC: 4.6 K/uL (ref 4.0–10.5)
nRBC: 0 % (ref 0.0–0.2)

## 2024-08-27 LAB — TROPONIN I (HIGH SENSITIVITY): Troponin I (High Sensitivity): 8 ng/L (ref <18)

## 2024-08-27 MED ORDER — ONDANSETRON HCL 4 MG/2ML IJ SOLN
4.0000 mg | Freq: Once | INTRAMUSCULAR | Status: AC
  Administered 2024-08-27: 4 mg via INTRAVENOUS
  Filled 2024-08-27: qty 2

## 2024-08-27 MED ORDER — OXYCODONE-ACETAMINOPHEN 5-325 MG PO TABS: 1.0000 | ORAL_TABLET | Freq: Four times a day (QID) | ORAL | 0 refills | Status: AC | PRN

## 2024-08-27 MED ORDER — TETANUS-DIPHTH-ACELL PERTUSSIS 5-2-15.5 LF-MCG/0.5 IM SUSP
0.5000 mL | Freq: Once | INTRAMUSCULAR | Status: AC
  Administered 2024-08-27: 0.5 mL via INTRAMUSCULAR
  Filled 2024-08-27: qty 0.5

## 2024-08-27 MED ORDER — OXYCODONE-ACETAMINOPHEN 5-325 MG PO TABS
1.0000 | ORAL_TABLET | Freq: Once | ORAL | Status: AC
  Administered 2024-08-27: 1 via ORAL
  Filled 2024-08-27: qty 1

## 2024-08-27 MED ORDER — KETOROLAC TROMETHAMINE 15 MG/ML IJ SOLN
15.0000 mg | Freq: Once | INTRAMUSCULAR | Status: AC
  Administered 2024-08-27: 15 mg via INTRAVENOUS
  Filled 2024-08-27: qty 1

## 2024-08-27 MED ORDER — MORPHINE SULFATE (PF) 4 MG/ML IV SOLN
4.0000 mg | Freq: Once | INTRAVENOUS | Status: AC
  Administered 2024-08-27: 4 mg via INTRAVENOUS
  Filled 2024-08-27: qty 1

## 2024-08-27 NOTE — Discharge Instructions (Addendum)
 Your imaging results showed a nondisplaced sternal fracture.  This is an injury that will heal on its own.  Take ibuprofen and Tylenol  for pain.  A prescription for narcotic pain medication was sent to your pharmacy.  Take this as needed.  The hematoma on your leg should decrease in size.  You will likely notice increased area of bruising.  Your previous echocardiogram did show moderate to severe aortic stenosis.  This can be a cause of passing out.  It is important that you keep your follow-up appointment with cardiology next week. Return to the emergency department for any new or worsening symptoms of concern.
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
## 2024-08-28 NOTE — Telephone Encounter (Signed)
 [place] to continue, refill sent.

## 2024-08-29 ENCOUNTER — Ambulatory Visit (HOSPITAL_COMMUNITY)

## 2024-08-29 NOTE — Telephone Encounter (Signed)
 ATC x1. No answer- phone kept ringing.

## 2024-09-01 ENCOUNTER — Ambulatory Visit: Admitting: Psychiatry

## 2024-09-02 ENCOUNTER — Ambulatory Visit: Admitting: Psychiatry
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
## 2024-09-03 ENCOUNTER — Ambulatory Visit: Attending: Physician Assistant | Admitting: Cardiology

## 2024-09-03 ENCOUNTER — Ambulatory Visit

## 2024-09-03 ENCOUNTER — Encounter: Payer: Self-pay | Admitting: Physician Assistant

## 2024-09-03 VITALS — BP 92/54 | HR 72 | Ht 67.0 in | Wt 111.8 lb

## 2024-09-03 DIAGNOSIS — R55 Syncope and collapse: Secondary | ICD-10-CM | POA: Diagnosis not present

## 2024-09-03 DIAGNOSIS — Q2381 Bicuspid aortic valve: Secondary | ICD-10-CM

## 2024-09-03 DIAGNOSIS — I350 Nonrheumatic aortic (valve) stenosis: Secondary | ICD-10-CM

## 2024-09-03 DIAGNOSIS — E785 Hyperlipidemia, unspecified: Secondary | ICD-10-CM | POA: Diagnosis not present

## 2024-09-03 NOTE — Patient Instructions (Signed)
 Thank you for choosing [facility]!     Medication Instructions:  No medication changes were made during today's visit.  *If you need a refill on your cardiac medications before your next appointment, please call your pharmacy*   Lab Work: No labs were ordered during today's visit.  If you have labs (blood work) drawn today and your tests are completely normal, you will receive your results only by: MyChart Message (if you have MyChart) OR A paper copy in the mail If you have any lab test that is abnormal or we need to change your treatment, we will call you to review the results.   Testing/Procedures:  ZIO XT- Long Term Monitor Instructions   Your physician has requested you wear your ZIO patch monitor__14_____days.   This is a single patch monitor.  [employer] supplies one patch monitor per enrollment.  Additional stickers are not available.   Please do not apply patch if you will be having a Nuclear Stress Test, Echocardiogram, Cardiac CT, MRI, or Chest Xray during the time frame you would be wearing the monitor. The patch cannot be worn during these tests.  You cannot remove and re-apply the ZIO XT patch monitor.   Your ZIO patch monitor will be sent USPS Priority mail from [facility] directly to your home address. The monitor may also be mailed to a PO BOX if home delivery is not available.   It may take 3-5 days to receive your monitor after you have been enrolled.   Once you have received you monitor, please review enclosed instructions.  Your monitor has already been registered assigning a specific monitor serial # to you.   Applying the monitor   Shave hair from upper left chest.   Hold abrader disc by orange tab.  Rub abrader in 40 strokes over left upper chest as indicated in your monitor instructions.   Clean area with 4 enclosed alcohol pads .  Use all pads to assure are is cleaned thoroughly.  Let dry.   Apply patch as indicated in monitor  instructions.  Patch will be place under collarbone on left side of chest with arrow pointing upward.   Rub patch adhesive wings for 2 minutes.Remove white label marked 1.  Remove white label marked 2.  Rub patch adhesive wings for 2 additional minutes.   While looking in a mirror, press and release button in center of patch.  A small green light will flash 3-4 times .  This will be your only indicator the monitor has been turned on.     Do not shower for the first 24 hours.  You may shower after the first 24 hours.   Press button if you feel a symptom. You will hear a small click.  Record Date, Time and Symptom in the Patient Log Book.   When you are ready to remove patch, follow instructions on last 2 pages of Patient Log Book.  Stick patch monitor onto last page of Patient Log Book.   Place Patient Log Book in [place] box.  Use locking tab on box and tape box closed securely.  The [employer] has [employer] on it.  Please place in mailbox as soon as possible.  Your physician should have your test results approximately 7 days after the monitor has been mailed back to [facility].   Call [facility] Customer Care at [phone] if you have questions regarding your ZIO XT patch monitor.  Call them immediately if you see  an orange light blinking on your monitor.   If your monitor falls off in less than 4 days contact our Monitor department at [phone].  If your monitor becomes loose or falls off after 4 days call [employer] at [phone] for suggestions on securing your monitor.       Provider:   Izetta Hummer, PA-C     Follow-Up: At [facility], you and your health needs are our priority.  As part of our continuing mission to provide you with exceptional heart care, we have created designated Provider Care Teams.  These Care Teams include your primary Cardiologist (physician) and Advanced Practice Providers (APPs -  Physician Assistants and Nurse  Practitioners) who all work together to provide you with the care you need, when you need it. We recommend signing up for the patient portal called MyChart.  Sign up information is provided on this After Visit Summary.  MyChart is used to connect with patients for Virtual Visits (Telemedicine).  Patients are able to view lab/test results, encounter notes, upcoming appointments, etc.  Non-urgent messages can be sent to your provider as well.   To learn more about what you can do with MyChart, go to [email or web address].

## 2024-09-03 NOTE — Progress Notes (Unsigned)
 Enrolled patient for a 14 day Zio AT monitor to be mailed to patients home   [name] to read

## 2024-09-05 DIAGNOSIS — R55 Syncope and collapse: Secondary | ICD-10-CM | POA: Diagnosis not present

## 2024-09-05 DIAGNOSIS — S2220XD Unspecified fracture of sternum, subsequent encounter for fracture with routine healing: Secondary | ICD-10-CM | POA: Diagnosis not present

## 2024-09-05 DIAGNOSIS — I350 Nonrheumatic aortic (valve) stenosis: Secondary | ICD-10-CM | POA: Diagnosis not present

## 2024-09-08 ENCOUNTER — Telehealth: Payer: Self-pay | Admitting: Psychiatry
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
## 2024-09-09 NOTE — Telephone Encounter (Signed)
Scheduled for 12:30.

## 2024-09-16 ENCOUNTER — Ambulatory Visit: Admitting: Psychiatry

## 2024-09-16 ENCOUNTER — Encounter: Payer: Self-pay | Admitting: Psychiatry

## 2024-09-16 DIAGNOSIS — F3181 Bipolar II disorder: Secondary | ICD-10-CM

## 2024-09-16 DIAGNOSIS — F4001 Agoraphobia with panic disorder: Secondary | ICD-10-CM | POA: Diagnosis not present

## 2024-09-16 DIAGNOSIS — F411 Generalized anxiety disorder: Secondary | ICD-10-CM | POA: Diagnosis not present

## 2024-09-16 DIAGNOSIS — F4310 Post-traumatic stress disorder, unspecified: Secondary | ICD-10-CM

## 2024-09-16 NOTE — Progress Notes (Signed)
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 clonidine  0.1 mg every morning and 2 mg nightly off-label for mania.  Canno t increase DT BP Continue Latuda  120 mg daily Continue lamotrigine 50 mg BID from neuro Continue Xanax  0.5 mg twice daily and 1 mg nightly  Fu 3  mos  Lorene Macintosh, MD, DFAPA   Please see After Visit Summary for patient specific instructions.  Future Appointments  Date Time Provider Department Center  09/25/2024  8:40 AM Thukkani, Arun K, MD [facility]  10/07/2024 11:00 AM Cottle, Lorene KANDICE Raddle., MD [facility] None      No orders of the defined types were placed in this encounter.    -------------------------------
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
## 2024-09-25 ENCOUNTER — Encounter: Payer: Self-pay | Admitting: Internal Medicine

## 2024-09-25 ENCOUNTER — Ambulatory Visit: Attending: Internal Medicine | Admitting: Internal Medicine

## 2024-09-25 VITALS — BP 102/60 | HR 89 | Ht 67.0 in | Wt 108.0 lb

## 2024-09-25 DIAGNOSIS — I6523 Occlusion and stenosis of bilateral carotid arteries: Secondary | ICD-10-CM

## 2024-09-25 DIAGNOSIS — I2510 Atherosclerotic heart disease of native coronary artery without angina pectoris: Secondary | ICD-10-CM

## 2024-09-25 DIAGNOSIS — E785 Hyperlipidemia, unspecified: Secondary | ICD-10-CM | POA: Diagnosis not present

## 2024-09-25 DIAGNOSIS — I700 Atherosclerosis of aorta: Secondary | ICD-10-CM

## 2024-09-25 DIAGNOSIS — Q2381 Bicuspid aortic valve: Secondary | ICD-10-CM | POA: Diagnosis not present

## 2024-09-25 NOTE — Progress Notes (Signed)
 Pre Surgical Assessment: 5 M Walk Test  12M=16.74ft  5 Meter Walk Test- trial 1: 4.46 seconds 5 Meter Walk Test- trial 2: 4.58 seconds 5 Meter Walk Test- trial 3: 4.43 seconds 5 Meter Walk Test Average: 4.49 seconds

## 2024-09-25 NOTE — Patient Instructions (Signed)
 Medication Instructions:  No medication changes were made at this visit. Continue current regimen.   *If you need a refill on your cardiac medications before your next appointment, please call your pharmacy*  Lab Work: None ordered today. If you have labs (blood work) drawn today and your tests are completely normal, you will receive your results only by: MyChart Message (if you have MyChart) OR A paper copy in the mail If you have any lab test that is abnormal or we need to change your treatment, we will call you to review the results.  Testing/Procedures: None ordered today.  Follow-Up: At [facility], you and your health needs are our priority.  As part of our continuing mission to provide you with exceptional heart care, our providers are all part of one team.  This team includes your primary Cardiologist (physician) and Advanced Practice Providers or APPs (Physician Assistants and Nurse Practitioners) who all work together to provide you with the care you need, when you need it.  Your next appointment:   Per structural heart team

## 2024-10-02 ENCOUNTER — Other Ambulatory Visit: Payer: Self-pay

## 2024-10-02 DIAGNOSIS — Q2381 Bicuspid aortic valve: Secondary | ICD-10-CM

## 2024-10-02 MED ORDER — PREDNISONE 50 MG PO TABS: ORAL_TABLET | ORAL | 0 refills | Status: DC

## 2024-10-02 MED ORDER — PREDNISONE 50 MG PO TABS: ORAL_TABLET | ORAL | 0 refills | Status: AC

## 2024-10-02 NOTE — Progress Notes (Signed)
 Note made in error

## 2024-10-02 NOTE — Addendum Note (Signed)
 Addended by: IZETTA CONCETTA KIDD on: 10/02/2024 09:44 AM   Modules accepted: Orders

## 2024-10-07 ENCOUNTER — Ambulatory Visit: Admitting: Psychiatry

## 2024-10-09 ENCOUNTER — Telehealth: Payer: Self-pay
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
## 2024-10-10 LAB — BASIC METABOLIC PANEL WITH GFR
BUN/Creatinine Ratio: 10 — ABNORMAL LOW (ref 12–28)
BUN: 9 mg/dL (ref 8–27)
CO2: 20 mmol/L (ref 20–29)
Calcium: 9.6 mg/dL (ref 8.7–10.3)
Chloride: 104 mmol/L (ref 96–106)
Creatinine, Ser: 0.92 mg/dL (ref 0.57–1.00)
Glucose: 106 mg/dL — ABNORMAL HIGH (ref 70–99)
Potassium: 3.8 mmol/L (ref 3.5–5.2)
Sodium: 139 mmol/L (ref 134–144)
eGFR: 67 mL/min/1.73

## 2024-10-11 ENCOUNTER — Ambulatory Visit: Payer: Self-pay | Admitting: Internal Medicine

## 2024-10-20 ENCOUNTER — Ambulatory Visit (HOSPITAL_COMMUNITY)
Admission: RE | Admit: 2024-10-20 | Discharge: 2024-10-20 | Disposition: A | Discharge status: Home / Self Care | Source: Ambulatory Visit | Attending: Internal Medicine | Admitting: Internal Medicine

## 2024-10-20 DIAGNOSIS — Q2381 Bicuspid aortic valve: Secondary | ICD-10-CM

## 2024-10-20 MED ORDER — IOHEXOL 350 MG/ML SOLN
100.0000 mL | Freq: Once | INTRAVENOUS | Status: AC | PRN
  Administered 2024-10-20: 100 mL via INTRAVENOUS

## 2024-10-20 NOTE — Progress Notes (Signed)
 Procedure Type: Isolated AVR Perioperative Outcome Estimate % Operative Mortality 1.29% Morbidity & Mortality 5.7% Stroke 1.26% Renal Failure 0.619% Reoperation 3.91% Prolonged Ventilation 2.51% Deep Sternal Wound Infection 0.022% Long Hospital Stay (>14 days) 2.19% Short Hospital Stay (<6 days)* 58.2%

## 2024-10-22 ENCOUNTER — Telehealth: Payer: Self-pay | Admitting: Internal Medicine

## 2024-10-22 NOTE — Telephone Encounter (Signed)
"  Patient is calling about results. Please advise   "

## 2024-10-23 NOTE — Telephone Encounter (Signed)
 Spoke with pt and let her know that Dr. Wendel and colleagues will be meeting on Tuesday morning to discuss her results and next steps and they will be in touch. Pt verbalized understanding of care and had no further questions at this time.
